# Patient Record
Sex: Female | Born: 1965 | Race: Black or African American | Hispanic: No | Smoking: Former smoker
Health system: Southern US, Community
[De-identification: ages and names within clinical notes are randomized; demographics above are authoritative.]

## PROBLEM LIST (undated history)

## (undated) DIAGNOSIS — K635 Polyp of colon: Secondary | ICD-10-CM

## (undated) DIAGNOSIS — K219 Gastro-esophageal reflux disease without esophagitis: Secondary | ICD-10-CM

## (undated) DIAGNOSIS — J45909 Unspecified asthma, uncomplicated: Secondary | ICD-10-CM

## (undated) DIAGNOSIS — F419 Anxiety disorder, unspecified: Secondary | ICD-10-CM

## (undated) DIAGNOSIS — E785 Hyperlipidemia, unspecified: Secondary | ICD-10-CM

## (undated) DIAGNOSIS — D649 Anemia, unspecified: Secondary | ICD-10-CM

## (undated) DIAGNOSIS — N189 Chronic kidney disease, unspecified: Secondary | ICD-10-CM

## (undated) DIAGNOSIS — T7840XA Allergy, unspecified, initial encounter: Secondary | ICD-10-CM

## (undated) DIAGNOSIS — Z923 Personal history of irradiation: Secondary | ICD-10-CM

## (undated) DIAGNOSIS — I10 Essential (primary) hypertension: Secondary | ICD-10-CM

## (undated) DIAGNOSIS — Z9289 Personal history of other medical treatment: Secondary | ICD-10-CM

## (undated) DIAGNOSIS — I739 Peripheral vascular disease, unspecified: Secondary | ICD-10-CM

## (undated) DIAGNOSIS — C50919 Malignant neoplasm of unspecified site of unspecified female breast: Secondary | ICD-10-CM

## (undated) DIAGNOSIS — Z79810 Long term (current) use of selective estrogen receptor modulators (SERMs): Secondary | ICD-10-CM

## (undated) DIAGNOSIS — F32A Depression, unspecified: Secondary | ICD-10-CM

## (undated) DIAGNOSIS — D219 Benign neoplasm of connective and other soft tissue, unspecified: Secondary | ICD-10-CM

## (undated) HISTORY — PX: BREAST SURGERY: SHX581

## (undated) HISTORY — PX: UPPER GASTROINTESTINAL ENDOSCOPY: SHX188

## (undated) HISTORY — DX: Anemia, unspecified: D64.9

## (undated) HISTORY — DX: Polyp of colon: K63.5

## (undated) HISTORY — DX: Allergy, unspecified, initial encounter: T78.40XA

## (undated) HISTORY — PX: BREAST LUMPECTOMY: SHX2

## (undated) HISTORY — PX: COLONOSCOPY: SHX174

## (undated) HISTORY — PX: APPENDECTOMY: SHX54

## (undated) HISTORY — DX: Hyperlipidemia, unspecified: E78.5

## (undated) HISTORY — DX: Depression, unspecified: F32.A

---

## 1985-11-23 HISTORY — PX: CERVICAL CONE BIOPSY: SUR198

## 1992-11-23 HISTORY — PX: LEG SURGERY: SHX1003

## 2001-08-04 ENCOUNTER — Other Ambulatory Visit: Admission: RE | Admit: 2001-08-04 | Discharge: 2001-08-04 | Payer: Self-pay | Admitting: Obstetrics and Gynecology

## 2002-08-10 ENCOUNTER — Other Ambulatory Visit: Admission: RE | Admit: 2002-08-10 | Discharge: 2002-08-10 | Payer: Self-pay | Admitting: Obstetrics and Gynecology

## 2002-10-01 ENCOUNTER — Emergency Department (HOSPITAL_COMMUNITY): Admission: EM | Admit: 2002-10-01 | Discharge: 2002-10-01 | Payer: Self-pay | Admitting: Emergency Medicine

## 2003-10-24 ENCOUNTER — Other Ambulatory Visit: Admission: RE | Admit: 2003-10-24 | Discharge: 2003-10-24 | Payer: Self-pay | Admitting: Obstetrics and Gynecology

## 2004-03-11 ENCOUNTER — Emergency Department (HOSPITAL_COMMUNITY): Admission: EM | Admit: 2004-03-11 | Discharge: 2004-03-12 | Payer: Self-pay | Admitting: *Deleted

## 2004-10-27 ENCOUNTER — Other Ambulatory Visit: Admission: RE | Admit: 2004-10-27 | Discharge: 2004-10-27 | Payer: Self-pay | Admitting: Obstetrics and Gynecology

## 2004-10-30 ENCOUNTER — Encounter: Admission: RE | Admit: 2004-10-30 | Discharge: 2004-10-30 | Payer: Self-pay | Admitting: Obstetrics and Gynecology

## 2007-11-21 ENCOUNTER — Other Ambulatory Visit: Admission: RE | Admit: 2007-11-21 | Discharge: 2007-11-21 | Payer: Self-pay | Admitting: *Deleted

## 2008-08-29 ENCOUNTER — Emergency Department (HOSPITAL_COMMUNITY): Admission: EM | Admit: 2008-08-29 | Discharge: 2008-08-29 | Payer: Self-pay | Admitting: Emergency Medicine

## 2009-01-09 ENCOUNTER — Encounter: Admission: RE | Admit: 2009-01-09 | Discharge: 2009-01-09 | Payer: Self-pay | Admitting: Family Medicine

## 2010-11-07 ENCOUNTER — Encounter
Admission: RE | Admit: 2010-11-07 | Discharge: 2010-11-07 | Payer: Self-pay | Source: Home / Self Care | Attending: Internal Medicine | Admitting: Internal Medicine

## 2010-11-18 ENCOUNTER — Encounter
Admission: RE | Admit: 2010-11-18 | Discharge: 2010-11-18 | Payer: Self-pay | Source: Home / Self Care | Attending: Internal Medicine | Admitting: Internal Medicine

## 2010-12-13 ENCOUNTER — Encounter: Payer: Self-pay | Admitting: Family Medicine

## 2010-12-17 ENCOUNTER — Ambulatory Visit (HOSPITAL_COMMUNITY): Admission: RE | Admit: 2010-12-17 | Payer: Self-pay | Source: Home / Self Care | Admitting: General Surgery

## 2011-06-09 ENCOUNTER — Other Ambulatory Visit: Payer: Self-pay | Admitting: Family Medicine

## 2011-06-09 DIAGNOSIS — R921 Mammographic calcification found on diagnostic imaging of breast: Secondary | ICD-10-CM

## 2011-06-15 ENCOUNTER — Other Ambulatory Visit: Payer: Self-pay | Admitting: Family Medicine

## 2011-06-15 ENCOUNTER — Ambulatory Visit
Admission: RE | Admit: 2011-06-15 | Discharge: 2011-06-15 | Disposition: A | Discharge status: Home / Self Care | Payer: BC Managed Care – PPO | Source: Ambulatory Visit | Attending: Family Medicine | Admitting: Family Medicine

## 2011-06-15 DIAGNOSIS — R921 Mammographic calcification found on diagnostic imaging of breast: Secondary | ICD-10-CM

## 2011-09-03 DIAGNOSIS — D649 Anemia, unspecified: Secondary | ICD-10-CM | POA: Insufficient documentation

## 2011-12-04 ENCOUNTER — Other Ambulatory Visit: Payer: Self-pay | Admitting: Family Medicine

## 2011-12-04 DIAGNOSIS — N63 Unspecified lump in unspecified breast: Secondary | ICD-10-CM

## 2011-12-04 DIAGNOSIS — R921 Mammographic calcification found on diagnostic imaging of breast: Secondary | ICD-10-CM

## 2011-12-31 ENCOUNTER — Ambulatory Visit
Admission: RE | Admit: 2011-12-31 | Discharge: 2011-12-31 | Disposition: A | Discharge status: Home / Self Care | Payer: BC Managed Care – PPO | Source: Ambulatory Visit | Attending: Family Medicine | Admitting: Family Medicine

## 2011-12-31 DIAGNOSIS — R921 Mammographic calcification found on diagnostic imaging of breast: Secondary | ICD-10-CM

## 2011-12-31 DIAGNOSIS — N63 Unspecified lump in unspecified breast: Secondary | ICD-10-CM

## 2012-01-07 DIAGNOSIS — N852 Hypertrophy of uterus: Secondary | ICD-10-CM | POA: Insufficient documentation

## 2012-03-29 ENCOUNTER — Emergency Department (INDEPENDENT_AMBULATORY_CARE_PROVIDER_SITE_OTHER): Payer: BC Managed Care – PPO

## 2012-03-29 ENCOUNTER — Emergency Department (HOSPITAL_BASED_OUTPATIENT_CLINIC_OR_DEPARTMENT_OTHER)
Admission: EM | Admit: 2012-03-29 | Discharge: 2012-03-30 | Disposition: A | Discharge status: Home / Self Care | Payer: BC Managed Care – PPO | Attending: Emergency Medicine | Admitting: Emergency Medicine

## 2012-03-29 ENCOUNTER — Encounter (HOSPITAL_BASED_OUTPATIENT_CLINIC_OR_DEPARTMENT_OTHER): Payer: Self-pay | Admitting: *Deleted

## 2012-03-29 DIAGNOSIS — Z87891 Personal history of nicotine dependence: Secondary | ICD-10-CM | POA: Insufficient documentation

## 2012-03-29 DIAGNOSIS — R51 Headache: Secondary | ICD-10-CM | POA: Insufficient documentation

## 2012-03-29 DIAGNOSIS — I10 Essential (primary) hypertension: Secondary | ICD-10-CM | POA: Insufficient documentation

## 2012-03-29 DIAGNOSIS — F411 Generalized anxiety disorder: Secondary | ICD-10-CM | POA: Insufficient documentation

## 2012-03-29 DIAGNOSIS — G43909 Migraine, unspecified, not intractable, without status migrainosus: Secondary | ICD-10-CM

## 2012-03-29 HISTORY — DX: Essential (primary) hypertension: I10

## 2012-03-29 HISTORY — DX: Anxiety disorder, unspecified: F41.9

## 2012-03-29 MED ORDER — HYDROCODONE-ACETAMINOPHEN 5-325 MG PO TABS
2.0000 | ORAL_TABLET | Freq: Once | ORAL | Status: AC
  Administered 2012-03-29: 2 via ORAL
  Filled 2012-03-29: qty 2

## 2012-03-29 NOTE — ED Notes (Signed)
Pt reports a lot of "tightness" in her head. Hx of migraine. Was recently started on prednisone and a ABX for cough.

## 2012-03-29 NOTE — ED Provider Notes (Signed)
History     CSN: [id number]  Arrival date & time 03/29/12  2202   First MD Initiated Contact with Patient 03/29/12 2254      Chief Complaint  Patient presents with  . Migraine    (Consider location/radiation/quality/duration/timing/severity/associated sxs/prior treatment) HPI Comments: Being treated for uri, today developed pressure in the front of her face and forehead.    Patient is a 46 y.o. female presenting with migraine. The history is provided by the patient.  Migraine This is a new problem. Episode onset: 6 hours ago. The problem occurs constantly. The problem has been gradually worsening. Associated symptoms include headaches.    Past Medical History  Diagnosis Date  . Anxiety   . Hypertension     Past Surgical History  Procedure Date  . Cesarean section   . Appendectomy     No family history on file.  History  Substance Use Topics  . Smoking status: Former Games developer  . Smokeless tobacco: Not on file  . Alcohol Use: Yes     rarely    OB History    Grav Para Term Preterm Abortions TAB SAB Ect Mult Living                  Review of Systems  Neurological: Positive for headaches.  All other systems reviewed and are negative.    Allergies  Review of patient's allergies indicates no known allergies.  Home Medications   Current Outpatient Rx  Name Route Sig Dispense Refill  . ALPRAZOLAM 0.5 MG PO TABS Oral Take 0.5 mg by mouth as needed.    . AMOXICILLIN 500 MG PO TABS Oral Take 500 mg by mouth 2 (two) times daily.    . CHLORTHALIDONE 25 MG PO TABS Oral Take 25 mg by mouth daily.    Marland Kitchen PREDNISONE 5 MG PO TABS Oral Take 5 mg by mouth as directed.      BP 164/96  Pulse 120  Temp(Src) 98.6 F (37 C) (Oral)  Resp 20  Ht 5\' 7"  (1.702 m)  Wt 195 lb (88.451 kg)  BMI 30.54 kg/m2  SpO2 100%  LMP 03/15/2012  Physical Exam  Nursing note and vitals reviewed. Constitutional: She is oriented to person, place, and time. She appears well-developed and  well-nourished. No distress.  HENT:  Head: Normocephalic and atraumatic.  Eyes: EOM are normal. Pupils are equal, round, and reactive to light.       No papilledema  Neck: Normal range of motion. Neck supple.  Cardiovascular: Normal rate and regular rhythm.  Exam reveals no gallop and no friction rub.   No murmur heard. Pulmonary/Chest: Effort normal and breath sounds normal. No respiratory distress. She has no wheezes.  Abdominal: Soft. Bowel sounds are normal. She exhibits no distension. There is no tenderness.  Musculoskeletal: Normal range of motion.  Neurological: She is alert and oriented to person, place, and time. No cranial nerve deficit. She exhibits normal muscle tone. Coordination normal.  Skin: Skin is warm and dry. She is not diaphoretic.    ED Course  Procedures (including critical care time)  Labs Reviewed - No data to display No results found.   No diagnosis found.    MDM  The ct looks okay.  I am unsure of the cause of her headache/pressure, but it does not appear emergent.  Will treat with pain meds, to return prn if worsens.        Geoffery Lyons, MD 03/30/12 [phone]

## 2012-03-30 MED ORDER — HYDROCODONE-ACETAMINOPHEN 5-500 MG PO TABS: 1.0000 | ORAL_TABLET | Freq: Four times a day (QID) | ORAL | Status: AC | PRN

## 2012-03-30 NOTE — Discharge Instructions (Signed)

## 2012-12-08 ENCOUNTER — Other Ambulatory Visit: Payer: Self-pay | Admitting: Family Medicine

## 2012-12-08 DIAGNOSIS — R921 Mammographic calcification found on diagnostic imaging of breast: Secondary | ICD-10-CM

## 2013-01-02 ENCOUNTER — Ambulatory Visit
Admission: RE | Admit: 2013-01-02 | Discharge: 2013-01-02 | Disposition: A | Discharge status: Home / Self Care | Payer: BC Managed Care – PPO | Source: Ambulatory Visit | Attending: Family Medicine | Admitting: Family Medicine

## 2013-01-02 DIAGNOSIS — R921 Mammographic calcification found on diagnostic imaging of breast: Secondary | ICD-10-CM

## 2013-01-09 ENCOUNTER — Ambulatory Visit (INDEPENDENT_AMBULATORY_CARE_PROVIDER_SITE_OTHER): Payer: BC Managed Care – PPO | Admitting: General Surgery

## 2013-01-09 ENCOUNTER — Encounter (INDEPENDENT_AMBULATORY_CARE_PROVIDER_SITE_OTHER): Payer: Self-pay | Admitting: General Surgery

## 2013-01-09 VITALS — BP 132/88 | HR 88 | Temp 98.6°F | Resp 16 | Ht 67.5 in | Wt 189.4 lb

## 2013-01-09 DIAGNOSIS — R920 Mammographic microcalcification found on diagnostic imaging of breast: Secondary | ICD-10-CM

## 2013-01-09 NOTE — Patient Instructions (Signed)
You have been found to have persistent, suspicious microcalcifications behind your left nipple. I think they have increased in number. We do not see a mass.  You will be scheduled for a left breast excisional biopsy with wire localization in the near future.    Breast Biopsy A breast biopsy is a procedure where a sample of breast tissue is removed from your breast. The tissue is examined under a microscope to see if cancerous cells are present. A breast biopsy is done when there is:  Any undiagnosed breast mass (tumor).  Nipple abnormalities, dimpling, crusting, or ulcerations.  Abnormal discharge from the nipple, especially blood.  Redness, swelling, and pain of the breast.  Calcium deposits (calcifications) or abnormalities seen on a mammogram, ultrasound result, or results of magnetic resonance imaging (MRI).  Suspicious changes in the breast seen on your mammogram. If the tumor is found to be cancerous (malignant), a breast biopsy can help to determine what the best treatment is for you. There are many different types of breast biopsies. Talk to your caregiver about your options and which type is best for you. LET YOUR CAREGIVER KNOW ABOUT:  Allergies to food or medicine.  Medicines taken, including vitamins, herbs, eyedrops, over-the-counter medicines, and creams.  Use of steroids (by mouth or creams).  Previous problems with anesthetics or numbing medicines.  History of bleeding problems or blood clots.  Previous surgery.  Other health problems, including diabetes and kidney problems.  Any recent colds or infections.  Possibility of pregnancy, if this applies. RISKS AND COMPLICATIONS   Bleeding.  Infection.  Allergy to medicines.  Bruising and swelling of the breast.  Alteration in the shape of the breast.  Not finding the lump or abnormality.  Needing more surgery. BEFORE THE PROCEDURE  Arrange for someone to drive you home after the procedure.  Do  not smoke for 2 weeks before the procedure. Stop smoking, if you smoke.  Do not drink alcohol for 24 hours before procedure.  Wear a good support bra to the procedure. PROCEDURE  You may be given a medicine to numb the breast area (local anesthesia) or a medicine to make you sleep (general anesthesia) during the procedure. The following are the different types of biopsies that can be performed.   Fine-needle aspiration A thin needle is attached to a syringe and inserted into the breast lump. Fluid and cells are removed and then looked at under a microscope. If the breast lump cannot be felt, an ultrasound may be used to help locate the lump and place the needle in the correct area.   Core needle biopsy A wide, hollow needle (core needle) is inserted into the breast lump 3 6 times to get tissue samples or cores. The samples are removed. The needle is usually placed in the correct area by using an ultrasound or X-ray.   Stereotactic biopsy X-ray equipment and a computer are used to analyze X-ray pictures of the breast lump. The computer then finds exactly where the core needle needs to be inserted. Tissue samples are removed.   Vacuum-assisted biopsy A small incision (less than  inch) is made in your breast. A biopsy device that includes a hollow needle and vacuum is passed through the incision and into the breast tissue. The vacuum gently draws abnormal breast tissue into the needle to remove it. This type of biopsy removes a larger tissue sample than a regular core needle biopsy. No stitches are needed, and there is usually little scarring.  Ultrasound-guided  core needle biopsy A high frequency ultrasound helps guide the core needle to the area of the mass or abnormality. An incision is made to insert the needle. Tissue samples are removed.  Open biopsy A larger incision is made in the breast. Your caregiver will attempt to remove the whole breast lump or as much as possible. AFTER THE  PROCEDURE  You will be taken to the recovery area. If you are doing well and have no problems, you will be allowed to go home.  You may notice bruising on your breast. This is normal.  Your caregiver may apply a pressure dressing on your breast for 24 48 hours. A pressure dressing is a bandage that is wrapped tightly around the chest to stop fluid from collecting underneath tissues. Document Released: 11/09/2005 Document Revised: 05/10/2012 Document Reviewed: 12/10/2011 [facility] Patient Information 2013 [place], [place].

## 2013-01-09 NOTE — Progress Notes (Signed)
Patient ID: Tracy Perez, female   DOB: 07/09/1966, 47 y.o.   MRN: [id number]  Chief Complaint  Patient presents with  . Breast Problem    new pt- eval lt breast calcification    HPI Tracy Perez is a 47 y.o. female.  She is referred by Dr. Christiana Pellant [facility] for evaluation of microcalcifications, left breast, retroareolar area.  This patient does not perceive a breast mass. She does not have a nipple discharge. She has never had breast surgery. She sometimes gets intermittent tingling in the left breast. Recent mammogram surface showed a suspicious retroareolar calcifications, too superficial to biopsy, BI-RADS category 4. She has had these problems in the past and I have looked at her prior films. This is the same area, but it looks like a few more calcifications than she had before, but no mass effect.  Family history reveals an aunt had a lumpectomy but she's not certain of the diagnosis. No other breast or ovarian malignancies noted.  Comorbidities include hypertension, anxiety, BMI 29. She's had 2 C-sections and an appendectomy. She is here with her husband today. She works as a Location manager HPI  Past Medical History  Diagnosis Date  . Anxiety   . Hypertension     Past Surgical History  Procedure Laterality Date  . Cesarean section    . Appendectomy      Family History  Problem Relation Age of Onset  . Cancer Maternal Aunt     breast    Social History History  Substance Use Topics  . Smoking status: Former Smoker    Quit date: 11/23/1989  . Smokeless tobacco: Not on file  . Alcohol Use: Yes     Comment: rarely    No Known Allergies  Current Outpatient Prescriptions  Medication Sig Dispense Refill  . ALPRAZolam (XANAX) 0.5 MG tablet Take 0.5 mg by mouth as needed.      . chlorthalidone (HYGROTON) 25 MG tablet Take 25 mg by mouth daily.       No current facility-administered medications for this visit.    Review of  Systems Review of Systems  Constitutional: Negative.   HENT: Negative.   Eyes: Negative.   Respiratory: Negative.   Cardiovascular: Negative.   Gastrointestinal: Negative.   Genitourinary: Negative.   Skin: Negative for rash and wound.  Neurological: Negative.   Hematological: Negative for adenopathy. Does not bruise/bleed easily.  Psychiatric/Behavioral: Negative for confusion. The patient is nervous/anxious.     Blood pressure 132/88, pulse 88, temperature 98.6 F (37 C), temperature source Temporal, resp. rate 16, height 5' 7.5" (1.715 m), weight 189 lb 6.4 oz (85.911 kg), last menstrual period 11/03/2012.  Physical Exam Physical Exam  Constitutional: She is oriented to person, place, and time. She appears well-developed and well-nourished. No distress.  Her husband is with her in the room throughout the encounter.  HENT:  Head: Normocephalic and atraumatic.  Nose: Nose normal.  Mouth/Throat: No oropharyngeal exudate.  Eyes: Conjunctivae and EOM are normal. Pupils are equal, round, and reactive to light. Left eye exhibits no discharge. No scleral icterus.  Neck: Neck supple. No JVD present. No tracheal deviation present. No thyromegaly present.  Cardiovascular: Normal rate, regular rhythm, normal heart sounds and intact distal pulses.   No murmur heard. Pulmonary/Chest: Effort normal and breath sounds normal. No respiratory distress. She has no wheezes. She has no rales. She exhibits no tenderness.  Both breasts are moderately large, soft. No skin lesions. No  nipple discharge. No palpable mass. No axillary adenopathy.  Abdominal: Soft. Bowel sounds are normal. She exhibits no distension and no mass. There is no tenderness. There is no rebound and no guarding.  Musculoskeletal: She exhibits no edema and no tenderness.  Lymphadenopathy:    She has no cervical adenopathy.  Neurological: She is alert and oriented to person, place, and time. She exhibits normal muscle tone.  Coordination normal.  Skin: Skin is warm. No rash noted. She is not diaphoretic. No erythema. No pallor.  Psychiatric: She has a normal mood and affect. Her behavior is normal. Judgment and thought content normal.    Data Reviewed Recent mammograms. Prior mammograms. Our old office notes.  Assessment    Suspicious microcalcifications, left breast, superficial retroareolar area. Risk of occult noninvasive cancer may be as high as 10% here.  Chronic anxiety  Hypertension  History C-section appendectomy     Plan    I had a long discussion with the patient and her husband about the imaging findings in change over time. Had discussion with him about differential diagnosis. Positive about the indications for surgical biopsy. Dr. Nona Dell techniques and Kling circumareolar incision. I told her that there was at least a 30% or greater chance that she would have abnormal sensation of the nipple.. Probably less than 5% chance of nipple necrosis. She was to go ahead and have the surgery done.  We'll schedule for left excisional biopsy, With needle localization.  I have discussed the indications, details, techniques, and numerous risks with the patient and her husband. They understand all these issues. All of  their questions were answered. They agree with this plan.       Angelia Mould. Derrell Lolling, M.D., [facility] General and Minimally invasive Surgery Breast and Colorectal Surgery Office:   [phone] Pager:   [phone]  01/09/2013, 10:17 AM

## 2013-01-10 ENCOUNTER — Encounter (HOSPITAL_BASED_OUTPATIENT_CLINIC_OR_DEPARTMENT_OTHER): Payer: Self-pay | Admitting: *Deleted

## 2013-01-10 NOTE — Progress Notes (Signed)
To come in for [id number]

## 2013-01-12 ENCOUNTER — Encounter (HOSPITAL_BASED_OUTPATIENT_CLINIC_OR_DEPARTMENT_OTHER)
Admission: RE | Admit: 2013-01-12 | Discharge: 2013-01-12 | Disposition: A | Discharge status: Home / Self Care | Payer: BC Managed Care – PPO | Source: Ambulatory Visit | Attending: General Surgery | Admitting: General Surgery

## 2013-01-12 LAB — BASIC METABOLIC PANEL
Calcium: 10 mg/dL (ref 8.4–10.5)
GFR calc Af Amer: >90 mL/min (ref >90)
GFR calc non Af Amer: >90 mL/min (ref >90)
Glucose, Bld: 77 mg/dL (ref 70–99)
Potassium: 4.1 mEq/L (ref 3.5–5.1)
Sodium: 140 mEq/L (ref 135–145)

## 2013-01-15 NOTE — H&P (Signed)
Tracy Perez   MRN:  [id number]   Description: 47 year old female  Provider: [name], MD  Department: [facility]       Diagnoses    Abnormal mammogram with microcalcification    -  Primary    793.81      Reason for Visit    Breast Problem    new pt- eval lt breast calcification        Current Vitals - Last Recorded    BP Pulse Temp(Src) Resp Ht Wt    132/88 88 98.6 F (37 C) (Temporal) 16 5' 7.5" (1.715 m) 189 lb 6.4 oz (85.911 kg)    BMI - 29.21 kg/m2 11/03/2012              History and Physical    [name], MD    Status: Signed                         HPI Tracy Perez is a 47 y.o. female.  She is referred by Dr. Christiana Pellant at the [facility] for evaluation of microcalcifications, left breast, retroareolar area.   This patient does not perceive a breast mass. She does not have a nipple discharge. She has never had breast surgery. She sometimes gets intermittent tingling in the left breast. Recent mammogram  showed a suspicious retroareolar calcifications, too superficial to biopsy, BI-RADS category 4. She has had these problems in the past and I have looked at her prior films. This is the same area, but it looks like a few more calcifications than she had before, but no mass effect.   Family history reveals an aunt had a lumpectomy but she's not certain of the diagnosis. No other breast or ovarian malignancies noted.   Comorbidities include hypertension, anxiety, BMI 29. She's had 2 C-sections and an appendectomy. She is here with her husband today. She works as a Location manager       Past Medical History   Diagnosis  Date   .  Anxiety     .  Hypertension           Past Surgical History   Procedure  Laterality  Date   .  Cesarean section       .  Appendectomy             Family History   Problem  Relation  Age of Onset   .  Cancer  Maternal Aunt         breast        Social History History    Substance Use Topics   .  Smoking status:  Former Smoker       Quit date:  11/23/1989   .  Smokeless tobacco:  Not on file   .  Alcohol Use:  Yes         Comment: rarely        No Known Allergies    Current Outpatient Prescriptions   Medication  Sig  Dispense  Refill   .  ALPRAZolam (XANAX) 0.5 MG tablet  Take 0.5 mg by mouth as needed.         .  chlorthalidone (HYGROTON) 25 MG tablet  Take 25 mg by mouth daily.             No current facility-administered medications for this visit.        Review of Systems  Constitutional: Negative.  HENT: Negative.   Eyes: Negative.   Respiratory: Negative.   Cardiovascular: Negative.   Gastrointestinal: Negative.   Genitourinary: Negative.   Skin: Negative for rash and wound.  Neurological: Negative.   Hematological: Negative for adenopathy. Does not bruise/bleed easily.  Psychiatric/Behavioral: Negative for confusion. The patient is nervous/anxious.       Blood pressure 132/88, pulse 88, temperature 98.6 F (37 C), temperature source Temporal, resp. rate 16, height 5' 7.5" (1.715 m), weight 189 lb 6.4 oz (85.911 kg), last menstrual period 11/03/2012.   Physical Exam   Constitutional: She is oriented to person, place, and time. She appears well-developed and well-nourished. No distress.  Her husband is with her in the room throughout the encounter.  HENT:   Head: Normocephalic and atraumatic.   Nose: Nose normal.   Mouth/Throat: No oropharyngeal exudate.  Eyes: Conjunctivae and EOM are normal. Pupils are equal, round, and reactive to light. Left eye exhibits no discharge. No scleral icterus.  Neck: Neck supple. No JVD present. No tracheal deviation present. No thyromegaly present.  Cardiovascular: Normal rate, regular rhythm, normal heart sounds and intact distal pulses.    No murmur heard. Pulmonary/Chest: Effort normal and breath sounds normal. No respiratory distress. She has no wheezes. She has no rales. She  exhibits no tenderness.  Both breasts are moderately large, soft. No skin lesions. No nipple discharge. No palpable mass. No axillary adenopathy.  Abdominal: Soft. Bowel sounds are normal. She exhibits no distension and no mass. There is no tenderness. There is no rebound and no guarding.  Musculoskeletal: She exhibits no edema and no tenderness.  Lymphadenopathy:    She has no cervical adenopathy.  Neurological: She is alert and oriented to person, place, and time. She exhibits normal muscle tone. Coordination normal.  Skin: Skin is warm. No rash noted. She is not diaphoretic. No erythema. No pallor.  Psychiatric: She has a normal mood and affect. Her behavior is normal. Judgment and thought content normal.      Data Reviewed Recent mammograms. Prior mammograms. Our old office notes.   Assessment    Suspicious microcalcifications, left breast, superficial retroareolar area. Risk of occult noninvasive cancer may be as high as 10% here.   Chronic anxiety   Hypertension   History C-section appendectomy      Plan    I had a long discussion with the patient and her husband about the imaging findings in change over time. Had discussion with them about differential diagnosis and  about the indications for surgical biopsy. I discussed the  techniques and circumareolar incision. I told her that there was at least a 30% or greater chance that she would have abnormal sensation of the nipple.. Probably less than 5% chance of nipple necrosis. She wants to go ahead and have the surgery done.   We'll schedule for left excisional biopsy, with needle localization.   I have discussed the indications, details, techniques, and numerous risks with the patient and her husband. They understand all these issues. All of  their questions were answered. They agree with this plan.          Angelia Mould. Derrell Lolling, M.D., [facility] General and Minimally invasive  Surgery Breast and Colorectal Surgery Office:   [phone] Pager:   [phone]

## 2013-01-16 ENCOUNTER — Telehealth (INDEPENDENT_AMBULATORY_CARE_PROVIDER_SITE_OTHER): Payer: Self-pay

## 2013-01-16 ENCOUNTER — Ambulatory Visit (HOSPITAL_BASED_OUTPATIENT_CLINIC_OR_DEPARTMENT_OTHER)
Admission: RE | Admit: 2013-01-16 | Discharge: 2013-01-16 | Disposition: A | Discharge status: Home / Self Care | Payer: BC Managed Care – PPO | Source: Ambulatory Visit | Attending: General Surgery | Admitting: General Surgery

## 2013-01-16 ENCOUNTER — Encounter (HOSPITAL_BASED_OUTPATIENT_CLINIC_OR_DEPARTMENT_OTHER)
Admission: RE | Disposition: A | Discharge status: Home / Self Care | Payer: Self-pay | Source: Ambulatory Visit | Attending: General Surgery

## 2013-01-16 ENCOUNTER — Encounter (HOSPITAL_BASED_OUTPATIENT_CLINIC_OR_DEPARTMENT_OTHER): Payer: Self-pay | Admitting: Certified Registered Nurse Anesthetist

## 2013-01-16 ENCOUNTER — Ambulatory Visit (HOSPITAL_BASED_OUTPATIENT_CLINIC_OR_DEPARTMENT_OTHER): Payer: BC Managed Care – PPO | Admitting: Certified Registered Nurse Anesthetist

## 2013-01-16 ENCOUNTER — Ambulatory Visit
Admission: RE | Admit: 2013-01-16 | Discharge: 2013-01-16 | Disposition: A | Discharge status: Home / Self Care | Payer: BC Managed Care – PPO | Source: Ambulatory Visit | Attending: General Surgery | Admitting: General Surgery

## 2013-01-16 ENCOUNTER — Encounter (INDEPENDENT_AMBULATORY_CARE_PROVIDER_SITE_OTHER): Payer: Self-pay

## 2013-01-16 DIAGNOSIS — D0590 Unspecified type of carcinoma in situ of unspecified breast: Secondary | ICD-10-CM | POA: Insufficient documentation

## 2013-01-16 DIAGNOSIS — R920 Mammographic microcalcification found on diagnostic imaging of breast: Secondary | ICD-10-CM

## 2013-01-16 DIAGNOSIS — F411 Generalized anxiety disorder: Secondary | ICD-10-CM | POA: Insufficient documentation

## 2013-01-16 DIAGNOSIS — C50919 Malignant neoplasm of unspecified site of unspecified female breast: Secondary | ICD-10-CM

## 2013-01-16 DIAGNOSIS — I10 Essential (primary) hypertension: Secondary | ICD-10-CM | POA: Insufficient documentation

## 2013-01-16 HISTORY — DX: Malignant neoplasm of unspecified site of unspecified female breast: C50.919

## 2013-01-16 HISTORY — PX: PARTIAL MASTECTOMY WITH NEEDLE LOCALIZATION: SHX6008

## 2013-01-16 SURGERY — PARTIAL MASTECTOMY WITH NEEDLE LOCALIZATION
Anesthesia: General | Site: Breast | Laterality: Left | Wound class: Clean

## 2013-01-16 MED ORDER — BUPIVACAINE-EPINEPHRINE 0.5% -1:200000 IJ SOLN
INTRAMUSCULAR | Status: DC | PRN
  Administered 2013-01-16: 10 mL

## 2013-01-16 MED ORDER — SODIUM CHLORIDE 0.9 % IV SOLN: 250.0000 mL | INTRAVENOUS | Status: DC | PRN

## 2013-01-16 MED ORDER — MIDAZOLAM HCL 2 MG/2ML IJ SOLN: 1.0000 mg | INTRAMUSCULAR | Status: DC | PRN

## 2013-01-16 MED ORDER — FENTANYL CITRATE 0.05 MG/ML IJ SOLN: 50.0000 ug | INTRAMUSCULAR | Status: DC | PRN

## 2013-01-16 MED ORDER — SODIUM CHLORIDE 0.9 % IV SOLN: INTRAVENOUS | Status: DC

## 2013-01-16 MED ORDER — 0.9 % SODIUM CHLORIDE (POUR BTL) OPTIME
TOPICAL | Status: DC | PRN
  Administered 2013-01-16: 200 mL

## 2013-01-16 MED ORDER — SODIUM CHLORIDE 0.9 % IJ SOLN: 3.0000 mL | Freq: Two times a day (BID) | INTRAMUSCULAR | Status: DC

## 2013-01-16 MED ORDER — PROPOFOL 10 MG/ML IV BOLUS
INTRAVENOUS | Status: DC | PRN
  Administered 2013-01-16: 200 mg via INTRAVENOUS

## 2013-01-16 MED ORDER — MORPHINE SULFATE 2 MG/ML IJ SOLN: 2.0000 mg | INTRAMUSCULAR | Status: DC | PRN

## 2013-01-16 MED ORDER — ACETAMINOPHEN 10 MG/ML IV SOLN
1000.0000 mg | Freq: Once | INTRAVENOUS | Status: AC
  Administered 2013-01-16: 1000 mg via INTRAVENOUS

## 2013-01-16 MED ORDER — HYDROMORPHONE HCL PF 1 MG/ML IJ SOLN
0.2500 mg | INTRAMUSCULAR | Status: DC | PRN
  Administered 2013-01-16: 0.5 mg via INTRAVENOUS

## 2013-01-16 MED ORDER — CEFAZOLIN SODIUM-DEXTROSE 2-3 GM-% IV SOLR
2.0000 g | INTRAVENOUS | Status: AC
  Administered 2013-01-16: 2 g via INTRAVENOUS

## 2013-01-16 MED ORDER — METOCLOPRAMIDE HCL 5 MG/ML IJ SOLN: 10.0000 mg | Freq: Once | INTRAMUSCULAR | Status: DC | PRN

## 2013-01-16 MED ORDER — ACETAMINOPHEN 325 MG PO TABS: 650.0000 mg | ORAL_TABLET | ORAL | Status: DC | PRN

## 2013-01-16 MED ORDER — LIDOCAINE HCL (CARDIAC) 20 MG/ML IV SOLN
INTRAVENOUS | Status: DC | PRN
  Administered 2013-01-16: 60 mg via INTRAVENOUS

## 2013-01-16 MED ORDER — KETOROLAC TROMETHAMINE 30 MG/ML IJ SOLN
INTRAMUSCULAR | Status: DC | PRN
  Administered 2013-01-16: 30 mg via INTRAVENOUS

## 2013-01-16 MED ORDER — CHLORHEXIDINE GLUCONATE 4 % EX LIQD: 1.0000 "application " | Freq: Once | CUTANEOUS | Status: DC

## 2013-01-16 MED ORDER — LACTATED RINGERS IV SOLN
INTRAVENOUS | Status: DC
  Administered 2013-01-16 (×2): via INTRAVENOUS

## 2013-01-16 MED ORDER — ONDANSETRON HCL 4 MG/2ML IJ SOLN: 4.0000 mg | Freq: Four times a day (QID) | INTRAMUSCULAR | Status: DC | PRN

## 2013-01-16 MED ORDER — ACETAMINOPHEN 650 MG RE SUPP: 650.0000 mg | RECTAL | Status: DC | PRN

## 2013-01-16 MED ORDER — DEXAMETHASONE SODIUM PHOSPHATE 4 MG/ML IJ SOLN
INTRAMUSCULAR | Status: DC | PRN
  Administered 2013-01-16: 10 mg via INTRAVENOUS

## 2013-01-16 MED ORDER — FENTANYL CITRATE 0.05 MG/ML IJ SOLN
INTRAMUSCULAR | Status: DC | PRN
  Administered 2013-01-16: 50 ug via INTRAVENOUS

## 2013-01-16 MED ORDER — HYDROCODONE-ACETAMINOPHEN 5-325 MG PO TABS: 1.0000 | ORAL_TABLET | ORAL | Status: DC | PRN

## 2013-01-16 MED ORDER — OXYCODONE HCL 5 MG PO TABS: 5.0000 mg | ORAL_TABLET | Freq: Once | ORAL | Status: DC | PRN

## 2013-01-16 MED ORDER — OXYCODONE HCL 5 MG/5ML PO SOLN: 5.0000 mg | Freq: Once | ORAL | Status: DC | PRN

## 2013-01-16 MED ORDER — SODIUM CHLORIDE 0.9 % IJ SOLN: 3.0000 mL | INTRAMUSCULAR | Status: DC | PRN

## 2013-01-16 MED ORDER — ONDANSETRON HCL 4 MG/2ML IJ SOLN
INTRAMUSCULAR | Status: DC | PRN
  Administered 2013-01-16: 4 mg via INTRAVENOUS

## 2013-01-16 MED ORDER — OXYCODONE HCL 5 MG PO TABS: 5.0000 mg | ORAL_TABLET | ORAL | Status: DC | PRN

## 2013-01-16 MED ORDER — MIDAZOLAM HCL 5 MG/5ML IJ SOLN
INTRAMUSCULAR | Status: DC | PRN
  Administered 2013-01-16: 1 mg via INTRAVENOUS

## 2013-01-16 MED ORDER — SCOPOLAMINE 1 MG/3DAYS TD PT72
1.0000 | MEDICATED_PATCH | Freq: Once | TRANSDERMAL | Status: DC
  Administered 2013-01-16: 1.5 mg via TRANSDERMAL

## 2013-01-16 SURGICAL SUPPLY — 56 items
ADH SKN CLS APL DERMABOND .7 (GAUZE/BANDAGES/DRESSINGS) ×1
APL SKNCLS STERI-STRIP NONHPOA (GAUZE/BANDAGES/DRESSINGS)
APPLIER CLIP 9.375 MED OPEN (MISCELLANEOUS)
APR CLP MED 9.3 20 MLT OPN (MISCELLANEOUS)
BANDAGE ELASTIC 6 VELCRO ST LF (GAUZE/BANDAGES/DRESSINGS) IMPLANT
BENZOIN TINCTURE PRP APPL 2/3 (GAUZE/BANDAGES/DRESSINGS) IMPLANT
BLADE SURG 15 STRL LF DISP TIS (BLADE) ×2 IMPLANT
BLADE SURG 15 STRL SS (BLADE) ×2
CANISTER SUCTION 1200CC (MISCELLANEOUS) ×2 IMPLANT
CHLORAPREP W/TINT 26ML (MISCELLANEOUS) ×2 IMPLANT
CLIP APPLIE 9.375 MED OPEN (MISCELLANEOUS) IMPLANT
CLOTH BEACON ORANGE TIMEOUT ST (SAFETY) ×2 IMPLANT
COVER MAYO STAND STRL (DRAPES) ×2 IMPLANT
COVER TABLE BACK 60X90 (DRAPES) ×2 IMPLANT
DECANTER SPIKE VIAL GLASS SM (MISCELLANEOUS) IMPLANT
DERMABOND ADVANCED (GAUZE/BANDAGES/DRESSINGS) ×1
DERMABOND ADVANCED .7 DNX12 (GAUZE/BANDAGES/DRESSINGS) IMPLANT
DEVICE DUBIN W/COMP PLATE 8390 (MISCELLANEOUS) ×1 IMPLANT
DRAPE LAPAROSCOPIC ABDOMINAL (DRAPES) IMPLANT
DRAPE LAPAROTOMY TRNSV 102X78 (DRAPE) IMPLANT
DRAPE PED LAPAROTOMY (DRAPES) ×2 IMPLANT
DRAPE UTILITY XL STRL (DRAPES) ×2 IMPLANT
ELECT REM PT RETURN 9FT ADLT (ELECTROSURGICAL) ×2
ELECTRODE REM PT RTRN 9FT ADLT (ELECTROSURGICAL) ×1 IMPLANT
GAUZE SPONGE 4X4 12PLY STRL LF (GAUZE/BANDAGES/DRESSINGS) IMPLANT
GAUZE SPONGE 4X4 16PLY XRAY LF (GAUZE/BANDAGES/DRESSINGS) IMPLANT
GLOVE BIO SURGEON STRL SZ7 (GLOVE) ×1 IMPLANT
GLOVE EUDERMIC 7 POWDERFREE (GLOVE) ×2 IMPLANT
GLOVE INDICATOR 7.0 STRL GRN (GLOVE) ×1 IMPLANT
GOWN PREVENTION PLUS XLARGE (GOWN DISPOSABLE) ×2 IMPLANT
GOWN PREVENTION PLUS XXLARGE (GOWN DISPOSABLE) ×2 IMPLANT
KIT MARKER MARGIN INK (KITS) IMPLANT
NDL HYPO 25X1 1.5 SAFETY (NEEDLE) ×1 IMPLANT
NEEDLE HYPO 22GX1.5 SAFETY (NEEDLE) IMPLANT
NEEDLE HYPO 25X1 1.5 SAFETY (NEEDLE) ×2 IMPLANT
NS IRRIG 1000ML POUR BTL (IV SOLUTION) ×2 IMPLANT
PACK BASIN DAY SURGERY FS (CUSTOM PROCEDURE TRAY) ×2 IMPLANT
PENCIL BUTTON HOLSTER BLD 10FT (ELECTRODE) ×2 IMPLANT
SLEEVE SCD COMPRESS KNEE MED (MISCELLANEOUS) ×1 IMPLANT
SPONGE LAP 4X18 X RAY DECT (DISPOSABLE) ×2 IMPLANT
STRIP CLOSURE SKIN 1/2X4 (GAUZE/BANDAGES/DRESSINGS) IMPLANT
SUT ETHILON 4 0 PS 2 18 (SUTURE) IMPLANT
SUT MNCRL AB 4-0 PS2 18 (SUTURE) ×2 IMPLANT
SUT SILK 2 0 SH (SUTURE) ×2 IMPLANT
SUT VIC AB 2-0 SH 27 (SUTURE)
SUT VIC AB 2-0 SH 27XBRD (SUTURE) IMPLANT
SUT VIC AB 4-0 P-3 18XBRD (SUTURE) IMPLANT
SUT VIC AB 4-0 P3 18 (SUTURE)
SUT VICRYL 3-0 CR8 SH (SUTURE) ×2 IMPLANT
SYR BULB 3OZ (MISCELLANEOUS) IMPLANT
SYR CONTROL 10ML LL (SYRINGE) ×2 IMPLANT
TAPE HYPAFIX 4 X10 (GAUZE/BANDAGES/DRESSINGS) IMPLANT
TOWEL OR NON WOVEN STRL DISP B (DISPOSABLE) ×2 IMPLANT
TUBE CONNECTING 20X1/4 (TUBING) ×2 IMPLANT
WATER STERILE IRR 1000ML POUR (IV SOLUTION) ×1 IMPLANT
YANKAUER SUCT BULB TIP NO VENT (SUCTIONS) ×2 IMPLANT

## 2013-01-16 NOTE — Interval H&P Note (Signed)
History and Physical Interval Note:  01/16/2013 1:24 PM  Tracy Perez  has presented today for surgery, with the diagnosis of abnormal mammorgram w microcalcifations   The goals and the  various methods of treatment have been discussed with the patient and family. After consideration of risks, benefits and other options for treatment, the patient has consented to  Procedure(s) with comments: PARTIAL MASTECTOMY WITH NEEDLE LOCALIZATION (Left) - left partial mastectomy with needle localization at breast center of [facility]  as a surgical intervention .  The patient's history has been reviewed, patient examined today, no change in status, stable for surgery.  I have reviewed the patient's chart and labs.  Questions were answered to the patient's satisfaction.     [name]

## 2013-01-16 NOTE — Anesthesia Postprocedure Evaluation (Signed)
Anesthesia Post Note  Patient: Tracy Perez  Procedure(s) Performed: Procedure(s) (LRB): PARTIAL MASTECTOMY WITH NEEDLE LOCALIZATION (Left)  Anesthesia type: General  Patient location: PACU  Post pain: Pain level controlled  Post assessment: Patient's Cardiovascular Status Stable  Last Vitals:  Filed Vitals:   01/16/13 1515  BP: 127/84  Pulse: 66  Temp:   Resp: 16    Post vital signs: Reviewed and stable  Level of consciousness: alert  Complications: No apparent anesthesia complications

## 2013-01-16 NOTE — Telephone Encounter (Signed)
The pt called to report that she remembered a previous surgery she had and did not report.  In 1994 she had surgery on her left leg for circulation.  They opened up a vessel to improve the blood flow.  She has surgery today by Dr Derrell Lolling

## 2013-01-16 NOTE — Anesthesia Preprocedure Evaluation (Addendum)
Anesthesia Evaluation  Patient identified by MRN, date of birth, ID band Patient awake    Reviewed: Allergy & Precautions, H&P , NPO status , Patient's Chart, lab work & pertinent test results, reviewed documented beta blocker date and time   Airway Mallampati: II TM Distance: >3 FB Neck ROM: full    Dental   Pulmonary neg pulmonary ROS,  breath sounds clear to auscultation        Cardiovascular negative cardio ROS  Rhythm:regular     Neuro/Psych negative neurological ROS  negative psych ROS   GI/Hepatic negative GI ROS, Neg liver ROS,   Endo/Other  negative endocrine ROS  Renal/GU negative Renal ROS  negative genitourinary   Musculoskeletal   Abdominal   Peds  Hematology negative hematology ROS (+)   Anesthesia Other Findings See surgeon's H&P   Reproductive/Obstetrics negative OB ROS                           Anesthesia Physical Anesthesia Plan  ASA: II  Anesthesia Plan: General   Post-op Pain Management:    Induction: Intravenous  Airway Management Planned: LMA  Additional Equipment:   Intra-op Plan:   Post-operative Plan: Extubation in OR  Informed Consent: I have reviewed the patients History and Physical, chart, labs and discussed the procedure including the risks, benefits and alternatives for the proposed anesthesia with the patient or authorized representative who has indicated his/her understanding and acceptance.   Dental Advisory Given  Plan Discussed with: CRNA and Surgeon  Anesthesia Plan Comments:         Anesthesia Quick Evaluation  

## 2013-01-16 NOTE — Op Note (Signed)
Patient Name:           Tracy Perez   Date of Surgery:        01/16/2013  Pre op Diagnosis:      Microcalcifications, left breast, subareolar area  Post op Diagnosis:    Same  Procedure:                 Left partial mastectomy with needle localization and margin assessment  Surgeon:                     Angelia Mould. Derrell Lolling, M.D., FACS  Assistant:                      None  Operative Indications:   Tracy Perez is a 47 y.o. female. She was referred by Dr. Christiana Pellant at the [facility] for evaluation of microcalcifications, left breast, retroareolar area.  This patient does not perceive a breast mass. She does not have a nipple discharge. She has never had breast surgery. She sometimes gets intermittent tingling in the left breast. Recent mammogram showed a suspicious retroareolar calcifications, too superficial to biopsy, BI-RADS category 4. She has had these problems in the past and I have looked at her prior films. This is the same area, but it looks like a few more calcifications than she had before, but no mass effect. Physical exam of the left breast is unremarkable. Family history reveals an aunt had a lumpectomy but she's not certain of the diagnosis. No other breast or ovarian malignancies noted. Comorbidities include hypertension, anxiety.   Operative Findings:       The focal area of microcalcifications in the left breast, retroareolar area were very superficial but were not involving the dermis. They appeared to be completely removed on the specimen mammogram. The specimen was marked and several areas to orient the pathology for margin assessment.  Procedure in Detail:          The patient underwent wire localization at the breast center [facility] by Dr. Jean Rosenthal. The wire was inserted near the areolar margin laterally and directed medially posterior to the microcalcifications. The patient was brought to [facility]  center and underwent general anesthesia. The left breast was prepped  and draped in a sterile fashion, intravenous antibiotics were given, and a surgical time out was performed. 0.5% Marcaine with epinephrine was used as a local infiltration anesthetic. A curvilinear incision was made at the areolar margin laterally. Dissection was carried down through the dermis into the subcutaneous tissue and then into the breast tissue. With lots of counter traction and traction we dissected under the nipple in a fairly superficial plane being careful to preserve the dermis and the blood supply. We then dissected circumferentially around the localizing wire. We marked the anterior, superior, and lateral margins with silk sutures. Specimen mammogram showed microcalcifications within the center of the specimen. The specimen was sent to the lab. Hemostasis was excellent and achieved with electrocautery. The wound was irrigated with saline. The breast tissue was closed in 2 separate layers with interrupted sutures of 3-0 Vicryl and the skin was closed with a running subcuticular suture of 4-0 Monocryl and Dermabond. The patient tolerated the procedure well and was taken to recovery room stable condition. EBL 10 cc. Counts correct. Complications none.     Angelia Mould. Derrell Lolling, M.D., FACS General and Minimally Invasive Surgery Breast and Colorectal Surgery  01/16/2013 2:32 PM

## 2013-01-16 NOTE — Transfer of Care (Signed)
Immediate Anesthesia Transfer of Care Note  Patient: Tracy Perez  Procedure(s) Performed: Procedure(s) with comments: PARTIAL MASTECTOMY WITH NEEDLE LOCALIZATION (Left) - left partial mastectomy with needle localization at breast center of [place]   Patient Location: PACU  Anesthesia Type:General  Level of Consciousness: awake, alert , oriented and patient cooperative  Airway & Oxygen Therapy: Patient Spontanous Breathing and Patient connected to face mask oxygen  Post-op Assessment: Report given to PACU RN and Post -op Vital signs reviewed and stable  Post vital signs: Reviewed and stable  Complications: No apparent anesthesia complications

## 2013-01-16 NOTE — Anesthesia Procedure Notes (Signed)
Procedure Name: LMA Insertion Date/Time: 01/16/2013 1:47 PM Performed by: Ilai Hiller D Pre-anesthesia Checklist: Patient identified, Emergency Drugs available, Suction available and Patient being monitored Patient Re-evaluated:Patient Re-evaluated prior to inductionOxygen Delivery Method: Circle System Utilized Preoxygenation: Pre-oxygenation with 100% oxygen Intubation Type: IV induction Ventilation: Mask ventilation without difficulty LMA: LMA inserted LMA Size: 4.0 Number of attempts: 1 Airway Equipment and Method: bite block Placement Confirmation: positive ETCO2 Tube secured with: Tape Dental Injury: Teeth and Oropharynx as per pre-operative assessment

## 2013-01-17 ENCOUNTER — Telehealth (INDEPENDENT_AMBULATORY_CARE_PROVIDER_SITE_OTHER): Payer: Self-pay | Admitting: *Deleted

## 2013-01-17 ENCOUNTER — Encounter (HOSPITAL_BASED_OUTPATIENT_CLINIC_OR_DEPARTMENT_OTHER): Payer: Self-pay | Admitting: General Surgery

## 2013-01-17 NOTE — Telephone Encounter (Signed)
Patient called to state she had surgery yesterday and is having increased swelling around the incision site.  Instructed patient to continue to use ice to the site and also to use NSAIDs to help with the swelling but it is normal following a surgical procedure.  Patient denies any signs of infection.

## 2013-01-19 ENCOUNTER — Telehealth (INDEPENDENT_AMBULATORY_CARE_PROVIDER_SITE_OTHER): Payer: Self-pay

## 2013-01-19 ENCOUNTER — Telehealth (INDEPENDENT_AMBULATORY_CARE_PROVIDER_SITE_OTHER): Payer: Self-pay | Admitting: General Surgery

## 2013-01-19 ENCOUNTER — Other Ambulatory Visit (INDEPENDENT_AMBULATORY_CARE_PROVIDER_SITE_OTHER): Payer: Self-pay | Admitting: General Surgery

## 2013-01-19 DIAGNOSIS — R920 Mammographic microcalcification found on diagnostic imaging of breast: Secondary | ICD-10-CM

## 2013-01-19 NOTE — Telephone Encounter (Signed)
Patient called for path results. I do not see them in computer yet. Please call patient once you receive them.

## 2013-01-19 NOTE — Telephone Encounter (Signed)
Please call back she want Dr Derrell Lolling to talk to her husband about what he talked to her about today over the phone with her. Patient is aware that it may be tomorrow before they will receive a call

## 2013-01-19 NOTE — Telephone Encounter (Signed)
Dr. 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 The closest margin was anterior and we have a 2 mm margin. There is no ink on tumor. Breast diagnostic profile and typewritten report are pending.  I called Tracy Perez and discussed this with her. I told her that she should have a breast MRI and move her appointment up with me sooner. I told her that she did not necessarily need a mastectomy unless we found cancer elsewhere in the breast. She expressed understanding. I told her that we would eventually involve a medical oncologist and radiation oncologist.  Our office will arrange the breast MRI and the appointment with me.   Angelia Mould. Derrell Lolling, M.D., [facility] General and Minimally invasive Surgery Breast and Colorectal Surgery Office:   [phone] Pager:   [phone]

## 2013-01-20 ENCOUNTER — Other Ambulatory Visit (INDEPENDENT_AMBULATORY_CARE_PROVIDER_SITE_OTHER): Payer: Self-pay | Admitting: General Surgery

## 2013-01-20 DIAGNOSIS — C50912 Malignant neoplasm of unspecified site of left female breast: Secondary | ICD-10-CM

## 2013-01-21 ENCOUNTER — Telehealth (INDEPENDENT_AMBULATORY_CARE_PROVIDER_SITE_OTHER): Payer: Self-pay | Admitting: General Surgery

## 2013-01-21 NOTE — Telephone Encounter (Signed)
Patient's husband also wanted to discuss diagnosis and plans. I spoke to [name] who brought husband in on a conference call.  I discussed breast DCIS diagnosis, pending breast diagnostic profile,  indications and purpose for MRI, prompt follow up with me, surgical options, referral to med-onc and rad-onc.  They seem to understand .  I will see in office soon.  Tracy Perez. Derrell Lolling, M.D., [facility] General and Minimally invasive Surgery Breast and Colorectal Surgery Office:   [phone] Pager:   [phone]

## 2013-01-24 ENCOUNTER — Ambulatory Visit: Admission: RE | Admit: 2013-01-24 | Payer: BC Managed Care – PPO | Source: Ambulatory Visit

## 2013-01-25 ENCOUNTER — Telehealth (INDEPENDENT_AMBULATORY_CARE_PROVIDER_SITE_OTHER): Payer: Self-pay | Admitting: General Surgery

## 2013-01-25 ENCOUNTER — Other Ambulatory Visit (INDEPENDENT_AMBULATORY_CARE_PROVIDER_SITE_OTHER): Payer: Self-pay | Admitting: General Surgery

## 2013-01-25 DIAGNOSIS — R920 Mammographic microcalcification found on diagnostic imaging of breast: Secondary | ICD-10-CM

## 2013-01-25 DIAGNOSIS — C50919 Malignant neoplasm of unspecified site of unspecified female breast: Secondary | ICD-10-CM

## 2013-01-25 NOTE — Telephone Encounter (Signed)
Pt called to report the MRI with contrast was not completed because she became severely nauseated with vomiting during the procedure.  She called to ask if enough image was obtained or if she will have to repeat it.  Please advise:  [phone].

## 2013-01-25 NOTE — Telephone Encounter (Signed)
After discussion with Dr. Derrell Lolling and [facility], pt does need to repeat attempt for MRI, this week, so Dr. Derrell Lolling will be able to review and present at the Breast Conference.  It was suggested that the test be scheduled in the late morning, after 10:00am, on an empty stomach from the night before.  She may drink Sprite, tea or Gatorade as needed.  Dr. Derrell Lolling ordered her to take OTC Benadryl 50 mg 2 hours before the test to help abate the nausea and for the contrast to be injected more slowly this time.  MRI rescheduled for Saturday, January 28, 2013, at 1:15pm, [address] location.  Pt informed of date, time and location, to take Benadryl and refrain from eating.  She understands all and will comply.

## 2013-01-28 ENCOUNTER — Ambulatory Visit
Admission: RE | Admit: 2013-01-28 | Discharge: 2013-01-28 | Disposition: A | Discharge status: Home / Self Care | Payer: BC Managed Care – PPO | Source: Ambulatory Visit | Attending: General Surgery | Admitting: General Surgery

## 2013-01-28 MED ORDER — GADOBENATE DIMEGLUMINE 529 MG/ML IV SOLN
15.0000 mL | Freq: Once | INTRAVENOUS | Status: AC | PRN
  Administered 2013-01-28: 15 mL via INTRAVENOUS

## 2013-01-31 ENCOUNTER — Other Ambulatory Visit (INDEPENDENT_AMBULATORY_CARE_PROVIDER_SITE_OTHER): Payer: Self-pay

## 2013-01-31 ENCOUNTER — Telehealth (INDEPENDENT_AMBULATORY_CARE_PROVIDER_SITE_OTHER): Payer: Self-pay | Admitting: General Surgery

## 2013-01-31 DIAGNOSIS — D0512 Intraductal carcinoma in situ of left breast: Secondary | ICD-10-CM

## 2013-01-31 NOTE — Telephone Encounter (Signed)
Recent breast MRI shows biopsy changes in the subareolar area, but no other abnormal findings. This strongly suggests that her DCIS is localized and that she does not require further surgery, assuming that she is willing to undergo radiation therapy.  I call these results to her and discussed this with her in detail. She was advised she has the option of lumpectomy, radiation therapy and possibly antiestrogen therapy. She was also told that mastectomy is an option but without survival benefit over lumpectomy and radiation therapy. She seemed interested in discussing this further and in keeping her breast. She was quite nervous and anxious about this and did not want to proceed with referral to medical oncology and radiation oncology until she came into the office to discuss this further with me and her husband present. I told her that was very reasonable.  She has an appointment to see me on March 17 and she will bring her husband with her. We will go over her pathology findings, MRI findings, and options for treatment at that point in time. Ultimately she will need to be referred to medical oncology and radiation oncology.   Angelia Mould. Derrell Lolling, M.D., [facility] General and Minimally invasive Surgery Breast and Colorectal Surgery Office:   [phone] Pager:   [phone]

## 2013-02-01 ENCOUNTER — Telehealth: Payer: Self-pay | Admitting: *Deleted

## 2013-02-01 NOTE — Telephone Encounter (Signed)
Confirmed 02/13/13 appt w/ pt.  Mailed before appt letter & packet to pt.  Emailed [place] Dec to make aware.  Took paperwork to Med [facility] for chart.

## 2013-02-06 ENCOUNTER — Ambulatory Visit (INDEPENDENT_AMBULATORY_CARE_PROVIDER_SITE_OTHER): Payer: BC Managed Care – PPO | Admitting: General Surgery

## 2013-02-06 ENCOUNTER — Encounter (INDEPENDENT_AMBULATORY_CARE_PROVIDER_SITE_OTHER): Payer: Self-pay | Admitting: General Surgery

## 2013-02-06 VITALS — BP 130/88 | HR 77 | Temp 98.4°F | Resp 18 | Ht 67.5 in | Wt 189.8 lb

## 2013-02-06 DIAGNOSIS — D0590 Unspecified type of carcinoma in situ of unspecified breast: Secondary | ICD-10-CM

## 2013-02-06 DIAGNOSIS — D0592 Unspecified type of carcinoma in situ of left breast: Secondary | ICD-10-CM

## 2013-02-06 NOTE — Progress Notes (Signed)
Patient ID: Tracy Perez, female   DOB: 01/14/1966, 47 y.o.   MRN: [id number] History: This patient was referred to me for evaluation and surgical management of suspicious microcalcifications in the left breast, superficial subareolar area. They were too superficial for image guided biopsy. On 10/16/2013 she underwent left partial mastectomy with needle localization. 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 The closest margin was anterior, less than 2 mm, but was negative. Estrogen receptor 100%, progesterone receptor 85%. She essentially had an MRI which shows this to be a solitary finding, only biopsy affect in the left subareolar area. I discussed the pathology report and the MRI report with her and gave her copies. Basically she has no complaints about her breast. Minimal pain. Mild thickening. No drainage.  Exam: Patient looks well. In no distress. Left breast reveals circumareolar incision laterally healing well. A little bit of thickening and edema. The nipple and areola are viable and warm. No hematoma. No sign of infection. Rest of the breast is soft. No axillary mass  Assessment: DCIS with necrosis left breast, superficial subareolar area. Recovering uneventfully following left partial mastectomy with needle localization. ER positive. PR positive. Negative margins.  Plan: We spent a long time talking about options for intervention. We talked about about the differences in lumpectomy and radiation therapy versus mastectomy, antiestrogen therapy. We talked about long-term prognosis and survivorship. At this point in time she would like breast conservation if possible. I feel that is very appropriate in this setting. She'll be referred to medical oncology and radiation oncology immediately. Unless there are problems, I will see her back in 4 months.   Angelia Mould. Derrell Lolling, M.D., [facility] General and Minimally invasive Surgery Breast and  Colorectal Surgery Office:   [phone] Pager:   [phone]

## 2013-02-06 NOTE — Patient Instructions (Signed)
You are recovering from your left breast partial mastectomy without any obvious surgical complications.  We went over the pathology report today which shows ductal carcinoma in situ, hormone receptor positive, negative margins.  We went over the MRI test which shows that this is a solitary finding. There is no other abnormality in either breast and there are no significantly enlarged lymph nodes.  We talked about different options for treating you. At this time we're going to try to avoid a mastectomy which I think is very reasonable. You will be referred to a medical oncologist and a radiation oncologist to coordinate the other forms of  your treatment  Return to see Dr. Derrell Lolling in 4 months. Sooner if there are any problems.

## 2013-02-09 ENCOUNTER — Other Ambulatory Visit: Payer: Self-pay | Admitting: *Deleted

## 2013-02-09 DIAGNOSIS — C50112 Malignant neoplasm of central portion of left female breast: Secondary | ICD-10-CM

## 2013-02-13 ENCOUNTER — Ambulatory Visit: Payer: BC Managed Care – PPO

## 2013-02-13 ENCOUNTER — Encounter: Payer: Self-pay | Admitting: Oncology

## 2013-02-13 ENCOUNTER — Ambulatory Visit (HOSPITAL_BASED_OUTPATIENT_CLINIC_OR_DEPARTMENT_OTHER): Payer: BC Managed Care – PPO | Admitting: Oncology

## 2013-02-13 ENCOUNTER — Other Ambulatory Visit (HOSPITAL_BASED_OUTPATIENT_CLINIC_OR_DEPARTMENT_OTHER): Payer: BC Managed Care – PPO | Admitting: Lab

## 2013-02-13 ENCOUNTER — Encounter: Payer: Self-pay | Admitting: Radiation Oncology

## 2013-02-13 ENCOUNTER — Encounter: Payer: Self-pay | Admitting: *Deleted

## 2013-02-13 VITALS — BP 151/92 | HR 67 | Temp 98.7°F | Resp 20 | Ht 67.5 in | Wt 190.3 lb

## 2013-02-13 DIAGNOSIS — Z170 Estrogen receptor positive status [ER+]: Secondary | ICD-10-CM

## 2013-02-13 DIAGNOSIS — D0592 Unspecified type of carcinoma in situ of left breast: Secondary | ICD-10-CM

## 2013-02-13 DIAGNOSIS — D0590 Unspecified type of carcinoma in situ of unspecified breast: Secondary | ICD-10-CM

## 2013-02-13 DIAGNOSIS — C50112 Malignant neoplasm of central portion of left female breast: Secondary | ICD-10-CM

## 2013-02-13 LAB — CBC WITH DIFFERENTIAL/PLATELET
BASO%: 0.5 % (ref 0.0–2.0)
HCT: 37.3 % (ref 34.8–46.6)
LYMPH%: 37.2 % (ref 14.0–49.7)
MCH: 29.2 pg (ref 25.1–34.0)
MCHC: 33.3 g/dL (ref 31.5–36.0)
MCV: 87.6 fL (ref 79.5–101.0)
MONO#: 0.4 10*3/uL (ref 0.1–0.9)
MONO%: 7.8 % (ref 0.0–14.0)
NEUT%: 50.3 % (ref 38.4–76.8)
Platelets: 273 10*3/uL (ref 145–400)
RBC: 4.25 10*6/uL (ref 3.70–5.45)

## 2013-02-13 LAB — COMPREHENSIVE METABOLIC PANEL (CC13)
ALT: 12 U/L (ref 0–55)
Alkaline Phosphatase: 53 U/L (ref 40–150)
CO2: 26 mEq/L (ref 22–29)
Creatinine: 0.7 mg/dL (ref 0.6–1.1)
Sodium: 141 mEq/L (ref 136–145)
Total Bilirubin: 0.77 mg/dL (ref 0.20–1.20)
Total Protein: 7 g/dL (ref 6.4–8.3)

## 2013-02-13 NOTE — Progress Notes (Signed)
Tracy Perez [id number] 1965/12/24 47 y.o. 02/13/2013 1:59 PM  CC  Iona Hansen, NP [address] [place] [address] Dr. Claud Kelp Dr. 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 Patient is seen in medical oncology for discussion of treatment options.   STAGE:  Left central portion of breast DCIS, ER+/PR+,  TisNxMx S/P lumpectomy  REFERRING PHYSICIAN: Dr. Claud Kelp  HISTORY OF PRESENT ILLNESS:  Tracy Perez is a 47 y.o. female.  With past medical history significant for anxiety and hypertension. She underwent a screening mammogram that showed microcalcifications in the left breast, in the retroareolar area. It was too superficial to biopsy. She therefore had a  left excisional biopsy with needle localization on 12/16/2012. The final pathology revealed ductal carcinoma in situ with necrosis closest margin was anterior less than 2 mm but negative. Tumor was ER +100% PR +85%. She had an MRI that showed a solitary finding with only biopsy affect in the left sub-areolar area. She is now seen in medical oncology for further treatment recommendations. She is without any complaints today.   Past Medical History: Past Medical History  Diagnosis Date  . Anxiety   . Hypertension     Past Surgical History: Past Surgical History  Procedure Laterality Date  . Cesarean section    . Appendectomy    . Leg surgery Left 1994    vascular  . Partial mastectomy with needle localization Left 01/16/2013    Procedure: PARTIAL MASTECTOMY WITH NEEDLE LOCALIZATION;  Surgeon: [name], MD;  Location: [facility];  Service: General;  Laterality: Left;  Left partial mastectomy with needle localization at [facility]     Family History: Family History  Problem Relation Age of Onset  . Cancer Maternal Aunt     breast    Social  History History  Substance Use Topics  . Smoking status: Former Smoker    Quit date: 11/23/1989  . Smokeless tobacco: Not on file  . Alcohol Use: Yes     Comment: rarely    Allergies: Allergies  Allergen Reactions  . Gadolinium Derivatives Nausea And Vomiting    Nausea and vomiting immediately following injection.     Current Medications: Current Outpatient Prescriptions  Medication Sig Dispense Refill  . ALPRAZolam (XANAX) 0.5 MG tablet Take 0.5 mg by mouth as needed.      . chlorthalidone (HYGROTON) 25 MG tablet Take 25 mg by mouth daily.      Marland Kitchen HYDROcodone-acetaminophen (NORCO/VICODIN) 5-325 MG per tablet Take 1-2 tablets by mouth every 4 (four) hours as needed for pain.  30 tablet  1  . cholecalciferol (VITAMIN D) 1000 UNITS tablet Take 1,000 Units by mouth daily.      . ferrous fumarate (HEMOCYTE - 106 MG FE) 325 (106 FE) MG TABS Take 1 tablet by mouth daily.       No current facility-administered medications for this visit.    OB/GYN History: menarche at 82, premenopause, G3P3 age first at first birth 24  Fertility Discussion: N/A Prior History of Cancer: None  Health Maintenance:  Colonoscopy yes 5 years Bone Density no Last PAP smear 2012  ECOG PERFORMANCE STATUS: 0 - Asymptomatic  Genetic Counseling/testing: patient to be seen by Maylon Cos  REVIEW OF SYSTEMS:  A comprehensive review of systems was negative.  PHYSICAL EXAMINATION: Blood pressure 151/92, pulse 67, temperature 98.7 F (  37.1 C), temperature source Oral, resp. rate 20, height 5' 7.5" (1.715 m), weight 190 lb 4.8 oz (86.32 kg).  QIO:NGEXB, healthy, no distress, well nourished and well developed SKIN: skin color, texture, turgor are normal HEAD: Normocephalic EYES: PERRLA, EOMI EARS: External ears normal OROPHARYNX:no exudate and no erythema  NECK: supple, no adenopathy LYMPH:  no palpable lymphadenopathy, no hepatosplenomegaly BREAST:right breast normal without mass, skin or nipple  changes or axillary nodes, surgical scars noted in the left periareolar region. no nipple discharge LUNGS: clear to auscultation  HEART: regular rate & rhythm ABDOMEN:abdomen soft, non-tender, normal bowel sounds and no masses or organomegaly BACK: No CVA tenderness EXTREMITIES:no edema, no clubbing, no cyanosis  NEURO: alert & oriented x 3 with fluent speech, no focal motor/sensory deficits, gait normal     STUDIES/RESULTS: Mr Breast Bilateral W Wo Contrast  01/30/2013  *RADIOLOGY REPORT*  Clinical Data: DCIS at excisional biopsy for immediate left retroareolar calcifications.  The patient experienced nausea after initial administration of IV contrast and imaging was repeated on 01/28/2013 after administration of Benadryl.  The patient experienced no further ill effect.  BILATERAL BREAST MRI WITH AND WITHOUT CONTRAST  Technique: Multiplanar, multisequence MR images of both breasts were obtained prior to and following the intravenous administration of 15ml of Multihance.  Three dimensional images were evaluated at the independent DynaCad workstation.  Comparison:  Prior mammograms at [facility]  Findings: Excisional changes are noted in the immediate retroareolar left breast.  Correlate with pathology results to determine the status of margins, as residual malignancy may not be able to be differentiated from post-biopsy rim enhancement.  There is mild left breast trabecular edema.  A 1 cm central left upper outer quadrant lymph node is likely reactive, without cortical thickening.  Excisional biopsy cavity measures 5.7 x 4.3 x 3.0 cm. No abnormal axillary or internal mammary artery chain lymphadenopathy.  No abnormal T2 hyperintensity in the right breast.  After administration of contrast, there is a mild background parenchymal enhancement type pattern.  Enhancement of an otherwise normal-appearing left upper outer quadrant mid depth intramammary lymph node is incidentally  identified.  There is also apparent enhancement of an otherwise normal-appearing 5 mm left breast nine o'clock location mid depth intramammary lymph node.  No focal abnormal enhancement is identified surrounding the left excisional biopsy area or in the right breast.  IMPRESSION: Expected left retroareolar excisional biopsy change with probable left intramammary reactive lymph nodes.  No dominant area of abnormal enhancement in either breast elsewhere to suggest residual disease, allowing for possible obscuration by post surgical change.  RECOMMENDATION:  Treatment plan  BI-RADS CATEGORY 6:  Known biopsy-proven malignancy - appropriate action should be taken.  THREE-DIMENSIONAL MR IMAGE RENDERING ON INDEPENDENT WORKSTATION:  Three-dimensional MR images were rendered by post-processing of the original MR data on an independent workstation.  The three- dimensional MR images were interpreted, and findings were reported in the accompanying complete MRI report for this study.   Original Report Authenticated By: Christiana Pellant, M.D.    Mm Plc Breast Loc Dev   1st Lesion  Inc Mammo Guide  01/16/2013  *RADIOLOGY REPORT*  Clinical Data:  Suspicious left breast calcifications  NEEDLE LOCALIZATION WITH MAMMOGRAPHIC GUIDANCE AND SPECIMEN RADIOGRAPH  Comparison:  Previous exams.  Patient presents for needle localization prior to surgical excision of left breast calcifications.  I met with the patient and we discussed the procedure of needle localization including benefits and alternatives. We discussed the high likelihood of a  successful procedure. We discussed the risks of the procedure, including infection, bleeding, tissue injury, and further surgery. Informed, written consent was given.  Using mammographic guidance, sterile technique, 2% lidocaine and a 5 cm modified Kopans needle, the cluster of calcifications located within the subareolar portion of the left breast was localized using a lateral approach.  The films are  marked for Dr. Derrell Lolling.  Specimen radiograph was performed at day surgery, and confirms the intact wire and the cluster of calcifications to be centrally located within the tissue sample.  The specimen is marked for pathology.  IMPRESSION: Needle localization of calcifications located within the left breast.  No apparent complications.   Original Report Authenticated By: Rolla Plate, M.D.      LABS:    Chemistry      Component Value Date/Time   NA 141 02/13/2013 1303   NA 140 01/12/2013 1430   K 3.6 02/13/2013 1303   K 4.1 01/12/2013 1430   CL 106 02/13/2013 1303   CL 101 01/12/2013 1430   CO2 26 02/13/2013 1303   CO2 29 01/12/2013 1430   BUN 6.4* 02/13/2013 1303   BUN 9 01/12/2013 1430   CREATININE 0.7 02/13/2013 1303   CREATININE 0.75 01/12/2013 1430      Component Value Date/Time   CALCIUM 9.3 02/13/2013 1303   CALCIUM 10.0 01/12/2013 1430   ALKPHOS 53 02/13/2013 1303   AST 16 02/13/2013 1303   ALT 12 02/13/2013 1303   BILITOT 0.77 02/13/2013 1303      Lab Results  Component Value Date   WBC 5.6 02/13/2013   HGB 12.4 02/13/2013   HCT 37.3 02/13/2013   MCV 87.6 02/13/2013   PLT 273 02/13/2013   PATHOLOGY: ADDITIONAL INFORMATION: PROGNOSTIC INDICATORS - ACIS Results IMMUNOHISTOCHEMICAL AND MORPHOMETRIC ANALYSIS BY THE AUTOMATED CELLULAR IMAGING SYSTEM (ACIS) Estrogen Receptor (Negative, <1%): 100%, POSITIVE, STRONG STAINING INTENSITY Progesterone Receptor (Negative, <1%): 85%, POSITIVE, STRONG STAINING INTENSITY All controls stained appropriately Pecola Leisure MD Pathologist, Electronic Signature ( Signed 01/25/2013) FINAL DIAGNOSIS Diagnosis Breast, lumpectomy, Left central - DUCTAL CARCINOMA IN SITU WITH NECROSIS, SEE COMMENT. - IN SITU CARCINOMA IS LESS THAN 2 MM FROM NEAREST MARGIN (ANTERIOR). - MICROCALCIFICATIONS IDENTIFIED. - SEE TEMPLATE BELOW. Microscopic Comment BREAST, IN SITU CARCINOMA Specimen, including laterality: Left breast Procedure: Lumpectomy Grade of  carcinoma: II-III of III Necrosis: Present 1 of 3 Duplicate copy FINAL for MAILIN, COGLIANESE [phone]) Microscopic Comment(continued) Estimated tumor size: (glass slide measurement): up to 4 mm (multiple foci) Treatment effect: None If present, treatment effect in breast tissue, lymph nodes or both: N/A Distance to closest margin: 2 mm If margin positive, focally or broadly: N/A Breast prognostic profile: Pending and will be reported in an addendum Estrogen receptor: Progesterone receptor: Lymph nodes: Examined: 0 Lymph nodes with metastasis: N/A TNM: pTis, pNX Comments: There are multiple foci of in situ carcinoma identified that span 4 mm in greatest dimension. Slide 1C was reviewed by Dr. Raynald Blend who concurs with the presence of insitu carcinoma. The case ws discussed with Dr. Derrell Lolling on 01/19/2013 (CRR:caf 01/18/13)  ASSESSMENT    47 year old female with  #1 new diagnosis of ductal carcinoma in situ of the central portion of the left breast. She is status post partial mastectomy with the final pathology revealing a DCIS that was ER positive PR positive. Postoperatively she is doing well without any significant problems. Patient is a good candidate for chemoprevention with tamoxifen. We discussed role of tamoxifen in this setting. She understands that is used  to prevent ipsilateral as well as contralateral breast cancers in the future. We discussed benefits as well as risks and complications.  #2 patient and I discussed clinical trial including NSABP B. 43 looking at HER-2/neu receptors and Herceptin therapy in DCIS. She was interested and she was seen by research nurse. She has signed a consent form.  Clinical Trial Eligibility:  NSABP B. 43 Multidisciplinary conference discussion      PLAN:    #1 patient will be seen by radiation oncology.  #2 she will be eligible for genetics and we will make a referral.  #31 once completes her radiation will then plan on starting her  on tamoxifen.       Discussion: Patient is being treated per NCCN breast cancer care guidelines appropriate for stage.0   Thank you so much for allowing me to participate in the care of JAMIRIA LANGILL. I will continue to follow up the patient with you and assist in her care.  All questions were answered. The patient knows to call the clinic with any problems, questions or concerns. We can certainly see the patient much sooner if necessary.  I spent 55 minutes counseling the patient face to face. The total time spent in the appointment was 60 minutes.  Drue Second, MD Medical/Oncology [facility] [phone] (beeper) [phone] (Office)  02/13/2013, 1:59 PM

## 2013-02-13 NOTE — Patient Instructions (Addendum)
We discussed your diagnosis of DCIS and prevention with tamoxifen  We discussed role of radiation and you will be seen by Dr. Basilio Cairo on 3/25  I will see you back after completion of RT

## 2013-02-13 NOTE — Progress Notes (Signed)
Checked in new patient. No financial issues. Patient had her [facility] form

## 2013-02-14 ENCOUNTER — Ambulatory Visit
Admission: RE | Admit: 2013-02-14 | Discharge: 2013-02-14 | Disposition: A | Discharge status: Home / Self Care | Payer: BC Managed Care – PPO | Source: Ambulatory Visit | Attending: Radiation Oncology | Admitting: Radiation Oncology

## 2013-02-14 ENCOUNTER — Encounter: Payer: Self-pay | Admitting: Radiation Oncology

## 2013-02-14 VITALS — BP 144/84 | HR 73 | Temp 98.7°F | Resp 20 | Ht 67.5 in | Wt 192.0 lb

## 2013-02-14 DIAGNOSIS — C50112 Malignant neoplasm of central portion of left female breast: Secondary | ICD-10-CM

## 2013-02-14 DIAGNOSIS — Z170 Estrogen receptor positive status [ER+]: Secondary | ICD-10-CM | POA: Insufficient documentation

## 2013-02-14 DIAGNOSIS — D0590 Unspecified type of carcinoma in situ of unspecified breast: Secondary | ICD-10-CM | POA: Insufficient documentation

## 2013-02-14 DIAGNOSIS — Z853 Personal history of malignant neoplasm of breast: Secondary | ICD-10-CM | POA: Insufficient documentation

## 2013-02-14 HISTORY — DX: Malignant neoplasm of unspecified site of unspecified female breast: C50.919

## 2013-02-14 NOTE — Progress Notes (Signed)
Please see the Nurse Progress Note in the MD Initial Consult Encounter for this patient. 

## 2013-02-14 NOTE — Progress Notes (Signed)
Pt denies pain today but has post op soreness/tenderness of left breast. She denies fatigue, loss of appetite. Husband w/pt today. Pt works in [employer], 3 children.

## 2013-02-14 NOTE — Addendum Note (Signed)
Encounter addended by: Delynn Flavin, RN on: 02/14/2013  3:57 PM<BR>     Documentation filed: Charges VN

## 2013-02-14 NOTE — Progress Notes (Addendum)
Radiation Oncology         [phone] ________________________________  Initial outpatient Consultation  Name: Tracy Perez MRN: [id number]  Date: 02/14/2013  DOB: [birth year]  AO:ZHYQM, Letha Cape, NP  [name], MD   REFERRING PHYSICIAN: [name], MD  DIAGNOSIS: DCIS Left Breast , Stage 0  HISTORY OF PRESENT ILLNESS::Tracy Perez is a 47 y.o. female who was found to have suspicious left breast micro calcifications on screening mammography. She did undergo an MRI on 01/28/2013 of her breasts which demonstrated biopsy change in the left retroareolar region and probable left intramammary reactive lymph nodes. There is no dominant area of abnormal enhancement on either breast. She ultimately underwent left lumpectomy of the central portion of her left breast on 01/16/2013. This demonstrated multifocal grade 2-3 DCIS. This is ER/PR positive with necrosis. Her margin is negative by 1-46mm.  She has enrolled on NSABP B. 43, HER-2/neu results pending. Ultimately, she'll consider antiestrogen therapy with medical oncology after completing radiotherapy. She reports some postoperative tenderness and soreness in her left breast. She denies fatigue or loss of appetite. She works in Market researcher at [employer]   PREVIOUS RADIATION THERAPY: No  PAST MEDICAL HISTORY:  has a past medical history of Anxiety; Hypertension; and Breast cancer (01/16/13).    PAST SURGICAL HISTORY: Past Surgical History  Procedure Laterality Date  . Cesarean section       x 2  . Appendectomy    . Leg surgery Left 1994    vascular  . Partial mastectomy with needle localization Left 01/16/2013    Procedure: PARTIAL MASTECTOMY WITH NEEDLE LOCALIZATION;  Surgeon: [name], MD;  Location: [facility];  Service: General;  Laterality: Left;  Left partial mastectomy with needle localization at [facility]     FAMILY HISTORY: family history includes  Cancer in her maternal aunt.  SOCIAL HISTORY:  reports that she quit smoking about 23 years ago. Her smoking use included Cigarettes. She smoked 0.00 packs per day. She does not have any smokeless tobacco history on file. She reports that  drinks alcohol. She reports that she does not use illicit drugs.  ALLERGIES: Gadolinium derivatives  MEDICATIONS:  Current Outpatient Prescriptions  Medication Sig Dispense Refill  . ALPRAZolam (XANAX) 0.5 MG tablet Take 0.5 mg by mouth as needed.      . chlorthalidone (HYGROTON) 25 MG tablet Take 25 mg by mouth daily.      . cholecalciferol (VITAMIN D) 1000 UNITS tablet Take 1,000 Units by mouth daily.      . ferrous fumarate (HEMOCYTE - 106 MG FE) 325 (106 FE) MG TABS Take 1 tablet by mouth daily.      Marland Kitchen HYDROcodone-acetaminophen (NORCO/VICODIN) 5-325 MG per tablet Take 1-2 tablets by mouth every 4 (four) hours as needed for pain.  30 tablet  1   No current facility-administered medications for this encounter.    REVIEW OF SYSTEMS:    Pertinent items are noted in HPI.   PHYSICAL EXAM:  height is 5' 7.5" (1.715 m) and weight is 192 lb (87.091 kg). Her oral temperature is 98.7 F (37.1 C). Her blood pressure is 144/84 and her pulse is 73. Her respiration is 20.   General: Alert and oriented, in no acute distress HEENT: Head is normocephalic. Pupils are equally round and reactive to light. Extraocular movements are intact. Oropharynx is clear. Neck: Neck is supple, no palpable cervical or supraclavicular lymphadenopathy. Heart: Regular in rate  and rhythm with no murmurs, rubs, or gallops. Chest: Clear to auscultation bilaterally, with no rhonchi, wheezes, or rales. Abdomen: Soft, nontender, nondistended, with no rigidity or guarding. Extremities: No cyanosis or edema. Lymphatics: No concerning lymphadenopathy. Skin: No concerning lesions. Musculoskeletal: symmetric strength and muscle tone throughout. Neurologic: Cranial nerves II through XII are  grossly intact. No obvious focalities. Speech is fluent. Coordination is intact. Psychiatric: Judgment and insight are intact. Affect is appropriate. Breasts: firmness under left areola, consistent with post operative swelling - no axillary adenopathy bilaterally, nor any lesion of concern appreciated in either breast   LABORATORY DATA:  Lab Results  Component Value Date   WBC 5.6 02/13/2013   HGB 12.4 02/13/2013   HCT 37.3 02/13/2013   MCV 87.6 02/13/2013   PLT 273 02/13/2013   CMP     Component Value Date/Time   NA 141 02/13/2013 1303   NA 140 01/12/2013 1430   K 3.6 02/13/2013 1303   K 4.1 01/12/2013 1430   CL 106 02/13/2013 1303   CL 101 01/12/2013 1430   CO2 26 02/13/2013 1303   CO2 29 01/12/2013 1430   GLUCOSE 80 02/13/2013 1303   GLUCOSE 77 01/12/2013 1430   BUN 6.4* 02/13/2013 1303   BUN 9 01/12/2013 1430   CREATININE 0.7 02/13/2013 1303   CREATININE 0.75 01/12/2013 1430   CALCIUM 9.3 02/13/2013 1303   CALCIUM 10.0 01/12/2013 1430   PROT 7.0 02/13/2013 1303   ALBUMIN 3.7 02/13/2013 1303   AST 16 02/13/2013 1303   ALT 12 02/13/2013 1303   ALKPHOS 53 02/13/2013 1303   BILITOT 0.77 02/13/2013 1303   GFRNONAA >90 01/12/2013 1430   GFRAA >90 01/12/2013 1430         RADIOGRAPHY: Mr Breast Bilateral W Wo Contrast  01/30/2013  *RADIOLOGY REPORT*  Clinical Data: DCIS at excisional biopsy for immediate left retroareolar calcifications.  The patient experienced nausea after initial administration of IV contrast and imaging was repeated on 01/28/2013 after administration of Benadryl.  The patient experienced no further ill effect.  BILATERAL BREAST MRI WITH AND WITHOUT CONTRAST  Technique: Multiplanar, multisequence MR images of both breasts were obtained prior to and following the intravenous administration of 15ml of Multihance.  Three dimensional images were evaluated at the independent DynaCad workstation.  Comparison:  Prior mammograms at [facility]  Findings: Excisional  changes are noted in the immediate retroareolar left breast.  Correlate with pathology results to determine the status of margins, as residual malignancy may not be able to be differentiated from post-biopsy rim enhancement.  There is mild left breast trabecular edema.  A 1 cm central left upper outer quadrant lymph node is likely reactive, without cortical thickening.  Excisional biopsy cavity measures 5.7 x 4.3 x 3.0 cm. No abnormal axillary or internal mammary artery chain lymphadenopathy.  No abnormal T2 hyperintensity in the right breast.  After administration of contrast, there is a mild background parenchymal enhancement type pattern.  Enhancement of an otherwise normal-appearing left upper outer quadrant mid depth intramammary lymph node is incidentally identified.  There is also apparent enhancement of an otherwise normal-appearing 5 mm left breast nine o'clock location mid depth intramammary lymph node.  No focal abnormal enhancement is identified surrounding the left excisional biopsy area or in the right breast.  IMPRESSION: Expected left retroareolar excisional biopsy change with probable left intramammary reactive lymph nodes.  No dominant area of abnormal enhancement in either breast elsewhere to suggest residual disease, allowing for possible obscuration  by post surgical change.  RECOMMENDATION:  Treatment plan  BI-RADS CATEGORY 6:  Known biopsy-proven malignancy - appropriate action should be taken.  THREE-DIMENSIONAL MR IMAGE RENDERING ON INDEPENDENT WORKSTATION:  Three-dimensional MR images were rendered by post-processing of the original MR data on an independent workstation.  The three- dimensional MR images were interpreted, and findings were reported in the accompanying complete MRI report for this study.   Original Report Authenticated By: Christiana Pellant, M.D.    Mm Plc Breast Loc Dev   1st Lesion  Inc Mammo Guide  01/16/2013  *RADIOLOGY REPORT*  Clinical Data:  Suspicious left breast  calcifications  NEEDLE LOCALIZATION WITH MAMMOGRAPHIC GUIDANCE AND SPECIMEN RADIOGRAPH  Comparison:  Previous exams.  Patient presents for needle localization prior to surgical excision of left breast calcifications.  I met with the patient and we discussed the procedure of needle localization including benefits and alternatives. We discussed the high likelihood of a successful procedure. We discussed the risks of the procedure, including infection, bleeding, tissue injury, and further surgery. Informed, written consent was given.  Using mammographic guidance, sterile technique, 2% lidocaine and a 5 cm modified Kopans needle, the cluster of calcifications located within the subareolar portion of the left breast was localized using a lateral approach.  The films are marked for Dr. Derrell Lolling.  Specimen radiograph was performed at day surgery, and confirms the intact wire and the cluster of calcifications to be centrally located within the tissue sample.  The specimen is marked for pathology.  IMPRESSION: Needle localization of calcifications located within the left breast.  No apparent complications.   Original Report Authenticated By: Rolla Plate, M.D.       IMPRESSION/PLAN: It was a pleasure meeting the patient today. We discussed the risks, benefits, and side effects of radiotherapy in the setting of postoperative DCIS. We discussed that radiation would take approximately 6 weeks to complete and that radiotherapy will be held until her status in NSABP B. 43 is determined. We spoke about acute effects including skin irritation and fatigue as well as much less common late effects including lung and heart irritation. We spoke about the latest technology that is used to minimize the risk of late effects for breast cancer patients undergoing radiotherapy. No guarantees of treatment were given. The patient is enthusiastic about proceeding with treatment. I look forward to participating in the patient's care.  I  have contacted [facility], to verify if any postoperative mammography is necessary to rule out residual microcalcifications. They will get back in touch with me once they have reviewed her images.  Simulation to take place on 02/24/13 with start of whole breast RT about a week thereafter.   I spent 55 minutes minutes face to face with the patient and more than 50% of that time was spent in counseling and/or coordination of care.  [facility] received her case and does not feel that post -op mammography is needed.     __________________________________________   Lonie Peak, MD

## 2013-02-16 ENCOUNTER — Telehealth: Payer: Self-pay | Admitting: Oncology

## 2013-02-16 NOTE — Telephone Encounter (Signed)
S/w pt re appt for 6/23. D/t/KK per 3/24.

## 2013-02-17 NOTE — Addendum Note (Signed)
Encounter addended by: Rayford Williamsen Mintz Izel Hochberg, RN on: 02/17/2013  6:28 PM<BR>     Documentation filed: Charges VN

## 2013-02-24 ENCOUNTER — Ambulatory Visit
Admission: RE | Admit: 2013-02-24 | Discharge: 2013-02-24 | Disposition: A | Discharge status: Home / Self Care | Payer: BC Managed Care – PPO | Source: Ambulatory Visit | Attending: Radiation Oncology | Admitting: Radiation Oncology

## 2013-02-24 DIAGNOSIS — L988 Other specified disorders of the skin and subcutaneous tissue: Secondary | ICD-10-CM | POA: Insufficient documentation

## 2013-02-24 DIAGNOSIS — D0590 Unspecified type of carcinoma in situ of unspecified breast: Secondary | ICD-10-CM | POA: Insufficient documentation

## 2013-02-24 DIAGNOSIS — L259 Unspecified contact dermatitis, unspecified cause: Secondary | ICD-10-CM | POA: Insufficient documentation

## 2013-02-24 DIAGNOSIS — B373 Candidiasis of vulva and vagina: Secondary | ICD-10-CM | POA: Insufficient documentation

## 2013-02-24 DIAGNOSIS — B3731 Acute candidiasis of vulva and vagina: Secondary | ICD-10-CM | POA: Insufficient documentation

## 2013-02-24 DIAGNOSIS — C50112 Malignant neoplasm of central portion of left female breast: Secondary | ICD-10-CM

## 2013-02-24 DIAGNOSIS — Y842 Radiological procedure and radiotherapy as the cause of abnormal reaction of the patient, or of later complication, without mention of misadventure at the time of the procedure: Secondary | ICD-10-CM | POA: Insufficient documentation

## 2013-02-24 DIAGNOSIS — Z510 Encounter for antineoplastic radiation therapy: Secondary | ICD-10-CM | POA: Insufficient documentation

## 2013-02-24 NOTE — Progress Notes (Signed)
  Radiation Oncology         (336) [phone] ________________________________  Name: Tracy Perez MRN: [id number]  Date: 02/24/2013  DOB: 1966/07/05  SIMULATION AND TREATMENT PLANNING NOTE  outpatient  DIAGNOSIS:  Left breast DCIS  NARRATIVE:  The patient was brought to the CT Simulation planning suite.  Identity was confirmed.  All relevant records and images related to the planned course of therapy were reviewed.  The patient freely provided informed written consent to proceed with treatment after reviewing the details related to the planned course of therapy. The consent form was witnessed and verified by the simulation staff.    Then, the patient was set-up in a stable reproducible  supine position for radiation therapy- head in ACCUFORM.   RESPIRATORY MOTION MANAGEMENT SIMULATION  NARRATIVE:  In order to account for effect of respiratory motion on target structures and other organs (ie heart) in the planning and delivery of radiotherapy, this patient underwent respiratory motion management simulation.  To accomplish this, when the patient was brought to the CT simulation planning suite, 4D respiratory motion management CT images were obtained.  The CT images were loaded into the planning software.   Avoidance structures were contoured.  Treatment planning then occurred.     TREATMENT PLANNING NOTE: Treatment planning then occurred.  The radiation prescription was entered and confirmed.    A total of 3 medically necessary complex treatment devices were fabricated and supervised by me: 2 fields with MLCs, and accuform. I have requested : DVH of heart, lungs, lumpectomy cavity and 3D conformal planning to spare heart and lungs.   The patient will receive 50.4 Gy in 28 fractions to the whole left breast with opposed tangents.  She will then receive a boost with electrons to the lumpectomy cavity of 10 Gy in 5 fractions.   -----------------------------------  Lonie Peak, MD

## 2013-03-03 ENCOUNTER — Ambulatory Visit
Admission: RE | Admit: 2013-03-03 | Discharge: 2013-03-03 | Disposition: A | Discharge status: Home / Self Care | Payer: BC Managed Care – PPO | Source: Ambulatory Visit | Attending: Radiation Oncology | Admitting: Radiation Oncology

## 2013-03-03 DIAGNOSIS — C50112 Malignant neoplasm of central portion of left female breast: Secondary | ICD-10-CM

## 2013-03-06 ENCOUNTER — Ambulatory Visit
Admission: RE | Admit: 2013-03-06 | Discharge: 2013-03-06 | Disposition: A | Discharge status: Home / Self Care | Payer: BC Managed Care – PPO | Source: Ambulatory Visit | Attending: Radiation Oncology | Admitting: Radiation Oncology

## 2013-03-06 VITALS — BP 141/96 | HR 73 | Temp 98.8°F | Wt 193.3 lb

## 2013-03-06 DIAGNOSIS — C50112 Malignant neoplasm of central portion of left female breast: Secondary | ICD-10-CM

## 2013-03-06 DIAGNOSIS — D0592 Unspecified type of carcinoma in situ of left breast: Secondary | ICD-10-CM

## 2013-03-06 MED ORDER — ALRA NON-METALLIC DEODORANT (RAD-ONC)
1.0000 "application " | Freq: Once | TOPICAL | Status: AC
  Administered 2013-03-06: 1 via TOPICAL

## 2013-03-06 MED ORDER — RADIAPLEXRX EX GEL
Freq: Once | CUTANEOUS | Status: AC
  Administered 2013-03-06: 18:00:00 via TOPICAL

## 2013-03-06 NOTE — Progress Notes (Signed)
Simulation Verification Note - left breast, outpatient  The patient was brought to the treatment unit and placed in the planned treatment position. The clinical setup was verified. Then port films were obtained and uploaded to the radiation oncology medical record software.  The treatment beams were carefully compared against the planned radiation fields. The position location and shape of the radiation fields was reviewed. They targeted volume of tissue appears to be appropriately covered by the radiation beams. Organs at risk appear to be excluded as planned.  Based on my personal review, I approved the simulation verification. The patient's treatment will proceed as planned.  -----------------------------------  Lonie Peak, MD

## 2013-03-06 NOTE — Addendum Note (Signed)
Encounter addended by: Delynn Flavin, RN on: 03/06/2013  5:45 PM<BR>     Documentation filed: Inpatient MAR

## 2013-03-06 NOTE — Progress Notes (Signed)
Patient here for routine weekly assessment of radiation to left breast.Given Radiation therapy and You booklet , alra deodorant and radiaplex.Routine of clinic reviewed and side effects of treatment to include skin changes, fatigue and pain.

## 2013-03-06 NOTE — Progress Notes (Signed)
   Weekly Management Note:  outpatient Current Dose:  1.8 Gy  Projected Dose: 60.4 Gy   Narrative:  The patient presents for routine under treatment assessment.  CBCT/MVCT images/Port film x-rays were reviewed.  The chart was checked. Doing well. No complaints   Physical Findings:  weight is 193 lb 4.8 oz (87.68 kg). Her temperature is 98.8 F (37.1 C). Her blood pressure is 141/96 and her pulse is 73.  NAD  Impression:  The patient is tolerating radiotherapy.  Plan:  Continue radiotherapy as planned.  ________________________________   Lonie Peak, M.D.

## 2013-03-07 ENCOUNTER — Ambulatory Visit
Admission: RE | Admit: 2013-03-07 | Discharge: 2013-03-07 | Disposition: A | Discharge status: Home / Self Care | Payer: BC Managed Care – PPO | Source: Ambulatory Visit | Attending: Radiation Oncology | Admitting: Radiation Oncology

## 2013-03-08 ENCOUNTER — Ambulatory Visit
Admission: RE | Admit: 2013-03-08 | Discharge: 2013-03-08 | Disposition: A | Discharge status: Home / Self Care | Payer: BC Managed Care – PPO | Source: Ambulatory Visit | Attending: Radiation Oncology | Admitting: Radiation Oncology

## 2013-03-09 ENCOUNTER — Ambulatory Visit
Admission: RE | Admit: 2013-03-09 | Discharge: 2013-03-09 | Disposition: A | Discharge status: Home / Self Care | Payer: BC Managed Care – PPO | Source: Ambulatory Visit | Attending: Radiation Oncology | Admitting: Radiation Oncology

## 2013-03-10 ENCOUNTER — Ambulatory Visit
Admission: RE | Admit: 2013-03-10 | Discharge: 2013-03-10 | Disposition: A | Discharge status: Home / Self Care | Payer: BC Managed Care – PPO | Source: Ambulatory Visit | Attending: Radiation Oncology | Admitting: Radiation Oncology

## 2013-03-13 ENCOUNTER — Ambulatory Visit
Admission: RE | Admit: 2013-03-13 | Payer: BC Managed Care – PPO | Source: Ambulatory Visit | Admitting: Radiation Oncology

## 2013-03-13 ENCOUNTER — Ambulatory Visit
Admission: RE | Admit: 2013-03-13 | Discharge: 2013-03-13 | Disposition: A | Discharge status: Home / Self Care | Payer: BC Managed Care – PPO | Source: Ambulatory Visit | Attending: Radiation Oncology | Admitting: Radiation Oncology

## 2013-03-13 ENCOUNTER — Encounter: Payer: Self-pay | Admitting: Oncology

## 2013-03-13 VITALS — BP 124/87 | HR 84 | Temp 98.3°F | Ht 67.5 in | Wt 194.0 lb

## 2013-03-13 DIAGNOSIS — C50112 Malignant neoplasm of central portion of left female breast: Secondary | ICD-10-CM

## 2013-03-13 NOTE — Progress Notes (Signed)
Tracy Perez here for weekly under treatment visit.  She denies pain and fatigue.  She states that her left breast does feel heavy and swollen.  She does have slight hyperpigmentation on her left breast.  She is also concerned about eczema on her neck, back and arms.  She is also having anxiety and had two attacks last week.  She is a little tearful today and states that it has finally sunk in that she is being treated for cancer.  She also has a yeast infection and wondering if it is Ok to treat it during radiation treatment.

## 2013-03-14 ENCOUNTER — Ambulatory Visit
Admission: RE | Admit: 2013-03-14 | Discharge: 2013-03-14 | Disposition: A | Discharge status: Home / Self Care | Payer: BC Managed Care – PPO | Source: Ambulatory Visit | Attending: Radiation Oncology | Admitting: Radiation Oncology

## 2013-03-14 NOTE — Progress Notes (Signed)
   Weekly Management Note:  outpatient Current Dose:  10.8 Gy  Projected Dose: 60.4 Gy   Narrative:  The patient presents for routine under treatment assessment.  CBCT/MVCT images/Port film x-rays were reviewed.  The chart was checked. A little tearful re: diagnosis. Eczema on arms. Vaginal yeast infection. Breast swollen.  Physical Findings:  height is 5' 7.5" (1.715 m) and weight is 194 lb (87.998 kg). Her temperature is 98.3 F (36.8 C). Her blood pressure is 124/87 and her pulse is 84.   Left breast - probably persistent seroma. No excessive warmth. No skin changes.   Impression:  The patient is tolerating radiotherapy.  Plan:  Continue radiotherapy as planned. Ok to use any lotions outside tx area for eczema. Monistat fine for vag yeast infection.  Pt to seek out her former counselor for support.  I explained this is pre-cancerous condition, she has great prognosis.  ________________________________   Lonie Peak, M.D.

## 2013-03-15 ENCOUNTER — Ambulatory Visit
Admission: RE | Admit: 2013-03-15 | Discharge: 2013-03-15 | Disposition: A | Discharge status: Home / Self Care | Payer: BC Managed Care – PPO | Source: Ambulatory Visit | Attending: Radiation Oncology | Admitting: Radiation Oncology

## 2013-03-16 ENCOUNTER — Ambulatory Visit
Admission: RE | Admit: 2013-03-16 | Discharge: 2013-03-16 | Disposition: A | Discharge status: Home / Self Care | Payer: BC Managed Care – PPO | Source: Ambulatory Visit | Attending: Radiation Oncology | Admitting: Radiation Oncology

## 2013-03-17 ENCOUNTER — Ambulatory Visit
Admission: RE | Admit: 2013-03-17 | Discharge: 2013-03-17 | Disposition: A | Discharge status: Home / Self Care | Payer: BC Managed Care – PPO | Source: Ambulatory Visit | Attending: Radiation Oncology | Admitting: Radiation Oncology

## 2013-03-20 ENCOUNTER — Ambulatory Visit
Admission: RE | Admit: 2013-03-20 | Discharge: 2013-03-20 | Disposition: A | Discharge status: Home / Self Care | Payer: BC Managed Care – PPO | Source: Ambulatory Visit | Attending: Radiation Oncology | Admitting: Radiation Oncology

## 2013-03-20 VITALS — BP 116/84 | HR 100 | Temp 98.6°F | Ht 67.5 in | Wt 191.0 lb

## 2013-03-20 DIAGNOSIS — C50912 Malignant neoplasm of unspecified site of left female breast: Secondary | ICD-10-CM

## 2013-03-20 NOTE — Progress Notes (Signed)
Weekly Management Note:  Site: Left breast Current Dose:  1980  cGy Projected Dose: 5040  cGy  Narrative: The patient is seen today for routine under treatment assessment. CBCT/MVCT images/port films were reviewed. The chart was reviewed.   She is without complaints today except for mild to moderate fatigue. She uses Radioplex gel.  Physical Examination:  Filed Vitals:   03/20/13 1607  BP: 116/84  Pulse: 100  Temp: 98.6 F (37 C)  .  Weight: 191 lb (86.637 kg). There is hyperpigmentation the skin along her left breast with no areas of desquamation.  Impression: Tolerating radiation therapy well. She inquires as to whether not her fatigue worsen, and whether not she would need to be out of work. She needs to work. I told her that her fatigue should not significantly worsen, she probably will be able to continue with her employment. She works on an [employer] stage [employer].  Plan: Continue radiation therapy as planned.

## 2013-03-20 NOTE — Progress Notes (Signed)
Tracy Perez here for weekly under treat visit.  She has had 11/28 fractions to her left breast.  She denies pain.  She is feeling "sleepy."  She also has a question as to whether she can take vitamin D and iron during radiation treatment.  She also is wondering how long is allowed for a leave of absence from work since she is very tired.  The skin on her left breast is intact and she does have some hyperpigmentation on her left breast and she states that it is a little itchy underneath her breast.

## 2013-03-21 ENCOUNTER — Encounter: Payer: Self-pay | Admitting: *Deleted

## 2013-03-21 ENCOUNTER — Ambulatory Visit
Admission: RE | Admit: 2013-03-21 | Discharge: 2013-03-21 | Disposition: A | Discharge status: Home / Self Care | Payer: BC Managed Care – PPO | Source: Ambulatory Visit | Attending: Radiation Oncology | Admitting: Radiation Oncology

## 2013-03-21 DIAGNOSIS — C50112 Malignant neoplasm of central portion of left female breast: Secondary | ICD-10-CM

## 2013-03-21 MED ORDER — ALRA NON-METALLIC DEODORANT (RAD-ONC)
1.0000 "application " | Freq: Once | TOPICAL | Status: AC
  Administered 2013-03-21: 1 via TOPICAL

## 2013-03-21 MED ORDER — RADIAPLEXRX EX GEL
Freq: Once | CUTANEOUS | Status: AC
  Administered 2013-03-21: 16:00:00 via TOPICAL

## 2013-03-21 NOTE — Progress Notes (Signed)
Mailed after appt letter to pt. 

## 2013-03-22 ENCOUNTER — Ambulatory Visit
Admission: RE | Admit: 2013-03-22 | Discharge: 2013-03-22 | Disposition: A | Discharge status: Home / Self Care | Payer: BC Managed Care – PPO | Source: Ambulatory Visit | Attending: Radiation Oncology | Admitting: Radiation Oncology

## 2013-03-23 ENCOUNTER — Ambulatory Visit
Admission: RE | Admit: 2013-03-23 | Discharge: 2013-03-23 | Disposition: A | Discharge status: Home / Self Care | Payer: BC Managed Care – PPO | Source: Ambulatory Visit | Attending: Radiation Oncology | Admitting: Radiation Oncology

## 2013-03-24 ENCOUNTER — Ambulatory Visit
Admission: RE | Admit: 2013-03-24 | Discharge: 2013-03-24 | Disposition: A | Discharge status: Home / Self Care | Payer: BC Managed Care – PPO | Source: Ambulatory Visit | Attending: Radiation Oncology | Admitting: Radiation Oncology

## 2013-03-27 ENCOUNTER — Ambulatory Visit
Admission: RE | Admit: 2013-03-27 | Discharge: 2013-03-27 | Disposition: A | Discharge status: Home / Self Care | Payer: BC Managed Care – PPO | Source: Ambulatory Visit | Attending: Radiation Oncology | Admitting: Radiation Oncology

## 2013-03-27 ENCOUNTER — Encounter: Payer: Self-pay | Admitting: Radiation Oncology

## 2013-03-27 VITALS — BP 115/76 | HR 83 | Temp 98.5°F | Wt 190.9 lb

## 2013-03-27 DIAGNOSIS — C50112 Malignant neoplasm of central portion of left female breast: Secondary | ICD-10-CM

## 2013-03-27 NOTE — Progress Notes (Signed)
   Weekly Management Note:  outpatient Current Dose:  28.8 Gy  Projected Dose: 60.4 Gy   Narrative:  The patient presents for routine under treatment assessment.  CBCT/MVCT images/Port film x-rays were reviewed.  The chart was checked. Doing well but tired.  Physical Findings:  weight is 190 lb 14.4 oz (86.592 kg). Her temperature is 98.5 F (36.9 C). Her blood pressure is 115/76 and her pulse is 83.  hyperpigmented left breast, skin intact  Impression:  The patient is tolerating radiotherapy.  Plan:  Continue radiotherapy as planned. Patient wants to lose weight after completing RT.  Talked with her about [employer] programs and [employer], will also refer to our nutritionist.  ________________________________   Lonie Peak, M.D.

## 2013-03-27 NOTE — Progress Notes (Signed)
Tracy Perez has received 16 fractions to her left breast.  She c/o  Tenderness in the  left lateral breast.  Skin with erythema and hyperpigmentation , but skin intact.  She also reports fatigue and note it at it's on last Friday and Saturday.

## 2013-03-28 ENCOUNTER — Ambulatory Visit
Admission: RE | Admit: 2013-03-28 | Discharge: 2013-03-28 | Disposition: A | Discharge status: Home / Self Care | Payer: BC Managed Care – PPO | Source: Ambulatory Visit | Attending: Radiation Oncology | Admitting: Radiation Oncology

## 2013-03-29 ENCOUNTER — Ambulatory Visit
Admission: RE | Admit: 2013-03-29 | Discharge: 2013-03-29 | Disposition: A | Discharge status: Home / Self Care | Payer: BC Managed Care – PPO | Source: Ambulatory Visit | Attending: Radiation Oncology | Admitting: Radiation Oncology

## 2013-03-30 ENCOUNTER — Ambulatory Visit: Payer: BC Managed Care – PPO | Admitting: Nutrition

## 2013-03-30 ENCOUNTER — Ambulatory Visit
Admission: RE | Admit: 2013-03-30 | Discharge: 2013-03-30 | Disposition: A | Discharge status: Home / Self Care | Payer: BC Managed Care – PPO | Source: Ambulatory Visit | Attending: Radiation Oncology | Admitting: Radiation Oncology

## 2013-03-30 NOTE — Progress Notes (Signed)
Patient is a 47 year old female diagnosed with breast cancer. She is a patient of Dr. Basilio Cairo. Past medical history includes anxiety and hypertension.  Medications include Xanax and vitamin D.  Labs were reviewed.  Height: 67.5 inches. Weight: 190.9 pounds. Usual body weight: 190 pounds. BMI: 29.44.  Patient reports she wants to improve her eating habits. She would like to lose weight after her treatment is completed. Patient describes oral intake as consisting primarily of fast food at breakfast and lunch. She does occasionally cook dinner. She snacks on candy and chips. Patient reports she loves fruits and vegetables however does not feel that she has time to consume these.  Nutrition diagnosis: Overweight/obesity related to excessive energy intake and physical inactivity as evidenced by BMI of 29.44.  Intervention: I educated patient on the importance of increasing plant-based diet consisting of fruits, vegetables, whole grains, and lean protein sources. I have given her strategies for making improvements in her daily meal intake. I have provided suggestions of ways she could incorporate more plant-based foods. I have educated her to begin a food journal to identify when she is eating and why she is eating in addition to what she is eating. I've also educated patient on computer applications that she could utilize to help keep her on track. We briefly discussed the importance of exercise and the [facility]. Fact sheets provided. Teach back method used.  Monitoring, evaluation, goals: Patient will tolerate a healthy plant-based diet to promote slow, safe weight loss.  Next visit: No followup is scheduled at this time.

## 2013-03-31 ENCOUNTER — Ambulatory Visit
Admission: RE | Admit: 2013-03-31 | Discharge: 2013-03-31 | Disposition: A | Discharge status: Home / Self Care | Payer: BC Managed Care – PPO | Source: Ambulatory Visit | Attending: Radiation Oncology | Admitting: Radiation Oncology

## 2013-04-03 ENCOUNTER — Ambulatory Visit
Admission: RE | Admit: 2013-04-03 | Discharge: 2013-04-03 | Disposition: A | Discharge status: Home / Self Care | Payer: BC Managed Care – PPO | Source: Ambulatory Visit | Attending: Radiation Oncology | Admitting: Radiation Oncology

## 2013-04-03 VITALS — BP 116/82 | HR 77 | Temp 98.2°F | Wt 193.7 lb

## 2013-04-03 DIAGNOSIS — C50112 Malignant neoplasm of central portion of left female breast: Secondary | ICD-10-CM

## 2013-04-03 NOTE — Progress Notes (Signed)
Tracy Perez here for weekly under treat visit.  She has had 21/28 fractions to her left breast.  She denies pain and nausea.  She does have fatigue.  The skin on her left breast and underarm have hyperpigmentation.  She states that it is itchy and is sore under the breast, around her nipple and under her left arm.  She is applying Radiaplex gel twice a day.

## 2013-04-04 ENCOUNTER — Ambulatory Visit
Admission: RE | Admit: 2013-04-04 | Discharge: 2013-04-04 | Disposition: A | Discharge status: Home / Self Care | Payer: BC Managed Care – PPO | Source: Ambulatory Visit | Attending: Radiation Oncology | Admitting: Radiation Oncology

## 2013-04-04 NOTE — Progress Notes (Signed)
   Weekly Management Note:  Outpatient Current Dose:  37.8 Gy  Projected Dose: 50.4 Gy + 10 Gy boost  Narrative:  The patient presents for routine under treatment assessment.  CBCT/MVCT images/Port film x-rays were reviewed.  The chart was checked. She has no new complaints  Physical Findings:  weight is 193 lb 11.2 oz (87.862 kg). Her temperature is 98.2 F (36.8 C). Her blood pressure is 116/82 and her pulse is 77.  Increased hyperpigmentation of left breast and axilla.  Impression:  The patient is tolerating radiotherapy.  Plan:  Continue radiotherapy as planned.  ________________________________   Lonie Peak, M.D.

## 2013-04-05 ENCOUNTER — Ambulatory Visit: Payer: BC Managed Care – PPO | Admitting: Radiation Oncology

## 2013-04-05 ENCOUNTER — Encounter: Payer: Self-pay | Admitting: Radiation Oncology

## 2013-04-05 ENCOUNTER — Ambulatory Visit
Admission: RE | Admit: 2013-04-05 | Discharge: 2013-04-05 | Disposition: A | Discharge status: Home / Self Care | Payer: BC Managed Care – PPO | Source: Ambulatory Visit | Attending: Radiation Oncology | Admitting: Radiation Oncology

## 2013-04-06 ENCOUNTER — Ambulatory Visit
Admission: RE | Admit: 2013-04-06 | Discharge: 2013-04-06 | Disposition: A | Discharge status: Home / Self Care | Payer: BC Managed Care – PPO | Source: Ambulatory Visit | Attending: Radiation Oncology | Admitting: Radiation Oncology

## 2013-04-07 ENCOUNTER — Ambulatory Visit
Admission: RE | Admit: 2013-04-07 | Discharge: 2013-04-07 | Disposition: A | Discharge status: Home / Self Care | Payer: BC Managed Care – PPO | Source: Ambulatory Visit | Attending: Radiation Oncology | Admitting: Radiation Oncology

## 2013-04-07 ENCOUNTER — Encounter: Payer: Self-pay | Admitting: Radiation Oncology

## 2013-04-07 ENCOUNTER — Ambulatory Visit
Admission: RE | Admit: 2013-04-07 | Payer: BC Managed Care – PPO | Source: Ambulatory Visit | Admitting: Radiation Oncology

## 2013-04-10 ENCOUNTER — Ambulatory Visit
Admission: RE | Admit: 2013-04-10 | Discharge: 2013-04-10 | Disposition: A | Discharge status: Home / Self Care | Payer: BC Managed Care – PPO | Source: Ambulatory Visit | Attending: Radiation Oncology | Admitting: Radiation Oncology

## 2013-04-10 NOTE — Progress Notes (Signed)
Assessed left axilla desquamated area which has peeled.area cleaned and then neosporin applied.Informed to clean 2 to 3 times daily and apply triple antibiotic ointment or neosporin and cover with telfa.To reassess on Wednesday but knows to come by on Tuesday if needed.

## 2013-04-11 ENCOUNTER — Ambulatory Visit
Admission: RE | Admit: 2013-04-11 | Discharge: 2013-04-11 | Disposition: A | Discharge status: Home / Self Care | Payer: BC Managed Care – PPO | Source: Ambulatory Visit | Attending: Radiation Oncology | Admitting: Radiation Oncology

## 2013-04-12 ENCOUNTER — Ambulatory Visit
Admission: RE | Admit: 2013-04-12 | Discharge: 2013-04-12 | Disposition: A | Discharge status: Home / Self Care | Payer: BC Managed Care – PPO | Source: Ambulatory Visit | Attending: Radiation Oncology | Admitting: Radiation Oncology

## 2013-04-12 ENCOUNTER — Encounter: Payer: Self-pay | Admitting: Radiation Oncology

## 2013-04-13 ENCOUNTER — Ambulatory Visit
Admission: RE | Admit: 2013-04-13 | Discharge: 2013-04-13 | Disposition: A | Discharge status: Home / Self Care | Payer: BC Managed Care – PPO | Source: Ambulatory Visit | Attending: Radiation Oncology | Admitting: Radiation Oncology

## 2013-04-14 ENCOUNTER — Ambulatory Visit
Admission: RE | Admit: 2013-04-14 | Discharge: 2013-04-14 | Disposition: A | Discharge status: Home / Self Care | Payer: BC Managed Care – PPO | Source: Ambulatory Visit | Attending: Radiation Oncology | Admitting: Radiation Oncology

## 2013-04-17 ENCOUNTER — Ambulatory Visit: Payer: BC Managed Care – PPO

## 2013-04-18 ENCOUNTER — Ambulatory Visit
Admission: RE | Admit: 2013-04-18 | Discharge: 2013-04-18 | Disposition: A | Discharge status: Home / Self Care | Payer: BC Managed Care – PPO | Source: Ambulatory Visit | Attending: Radiation Oncology | Admitting: Radiation Oncology

## 2013-04-19 ENCOUNTER — Ambulatory Visit
Admission: RE | Admit: 2013-04-19 | Discharge: 2013-04-19 | Disposition: A | Discharge status: Home / Self Care | Payer: BC Managed Care – PPO | Source: Ambulatory Visit | Attending: Radiation Oncology | Admitting: Radiation Oncology

## 2013-04-19 ENCOUNTER — Ambulatory Visit: Payer: BC Managed Care – PPO

## 2013-04-19 VITALS — BP 115/77 | HR 79 | Temp 98.2°F | Ht 67.0 in | Wt 193.5 lb

## 2013-04-19 DIAGNOSIS — C50112 Malignant neoplasm of central portion of left female breast: Secondary | ICD-10-CM

## 2013-04-19 NOTE — Progress Notes (Signed)
.    Tracy Perez has received 32 fractions to her left breast.  She c/o soreness in left axilla and the incisional area.  Note hyperpigmentation of the left breast with dry desquamation in the left axilla and the left inframmary fold.  She also reports that she is fatigued, but is continuing to work full-time.

## 2013-04-19 NOTE — Progress Notes (Signed)
   Weekly Management Note:  outpatient Current Dose:  58.4 Gy  Projected Dose: 60.4 Gy   Narrative:  The patient presents for routine under treatment assessment.  CBCT/MVCT images/Port film x-rays were reviewed.  The chart was checked. She is increased skin irritation. Still able to work full-time.  Physical Findings:  height is 5\' 7"  (1.702 m) and weight is 193 lb 8 oz (87.771 kg). Her temperature is 98.2 F (36.8 C). Her blood pressure is 115/77 and her pulse is 79.  marketed hyperpigmentation throughout the left breast and axilla. Dry desquamation in the axilla and inframammary fold  Impression:  The patient is tolerating radiotherapy.  Plan:  Continue radiotherapy as planned. Continue radiaplex and Neosporin. F/u in 1 mo, sooner if needed  ________________________________   Lonie Peak, M.D.

## 2013-04-20 ENCOUNTER — Ambulatory Visit
Admission: RE | Admit: 2013-04-20 | Discharge: 2013-04-20 | Disposition: A | Discharge status: Home / Self Care | Payer: BC Managed Care – PPO | Source: Ambulatory Visit | Attending: Radiation Oncology | Admitting: Radiation Oncology

## 2013-04-20 ENCOUNTER — Encounter: Payer: Self-pay | Admitting: Radiation Oncology

## 2013-04-20 DIAGNOSIS — C50112 Malignant neoplasm of central portion of left female breast: Secondary | ICD-10-CM

## 2013-04-20 MED ORDER — RADIAPLEXRX EX GEL
Freq: Once | CUTANEOUS | Status: AC
  Administered 2013-04-20: 17:00:00 via TOPICAL

## 2013-04-24 NOTE — Progress Notes (Signed)
  Radiation Oncology         (336) [phone] ________________________________  Name: Tracy Perez MRN: [id number]  Date: 04/20/2013  DOB: 19-Dec-1965  End of Treatment Note  Diagnosis:   Left breast DCIS; ER/ PR+  Indication for treatment:  curative   Radiation treatment dates:   03/06/2013-04/20/2013  Site/dose:    1) Left Breast  /50.4Gy in 28 fractions 2) Left Breast Boost / 10 Gy in 5 fractions  Beams/energy:    1) Opposed Tangents, breathhold /  photons 2) Electron Boost / 12 MeV electrons  Narrative: The patient tolerated radiation treatment relatively well.   She developed marked hyperpigmentation over her breast with dry desquamation concentrated in the axilla and inframammary fold.  Plan: The patient has completed radiation treatment. The patient will return to radiation oncology clinic for routine followup in one month. I advised them to call or return sooner if they have any questions or concerns related to their recovery or treatment.  -----------------------------------  Lonie Peak, MD

## 2013-04-29 DIAGNOSIS — F4001 Agoraphobia with panic disorder: Secondary | ICD-10-CM | POA: Insufficient documentation

## 2013-04-29 DIAGNOSIS — F909 Attention-deficit hyperactivity disorder, unspecified type: Secondary | ICD-10-CM | POA: Insufficient documentation

## 2013-04-29 DIAGNOSIS — K219 Gastro-esophageal reflux disease without esophagitis: Secondary | ICD-10-CM | POA: Insufficient documentation

## 2013-05-03 NOTE — Progress Notes (Signed)
   04/12/13 Weekly Management Note:  outpatient Current Dose:  50.4 Gy  Projected Dose: 60.4 Gy   Narrative:  The patient presents for routine under treatment assessment.  CBCT/MVCT images/Port film x-rays were reviewed.  The chart was checked. Doing well.  Physical Findings:  vitals were not taken for this visit.  Diffuse hyperpigmentation throughout left breast with impending moist desquamation in a small patch in inframammary fold.  Impression:  The patient is tolerating radiotherapy.  Plan:  Continue radiotherapy as planned. Apply nepsporin to patch of impending moist desquamation - otherwise use Radiaplex diffusely.  ________________________________   Lonie Peak, M.D.

## 2013-05-03 NOTE — Progress Notes (Signed)
Electron Boost Treatment Planning Note / Complex simulation Note  Outpatient  Diagnosis: Breast DCIS, left  The patient's CT images from her free-breathing simulation were reviewed to plan her boost treatment to her left breast  lumpectomy cavity.  The boost to the lumpectomy cavity will be delivered with an en face electron field using 12 MeV energy.  Custom electron cut out will be used.  Special port plan approved.  10 Gy/5 fractions prescribed to 93% isodose line.  -----------------------------------  Lonie Peak, MD

## 2013-05-15 ENCOUNTER — Telehealth: Payer: Self-pay | Admitting: Oncology

## 2013-05-15 ENCOUNTER — Encounter: Payer: Self-pay | Admitting: Oncology

## 2013-05-15 ENCOUNTER — Ambulatory Visit (HOSPITAL_BASED_OUTPATIENT_CLINIC_OR_DEPARTMENT_OTHER): Payer: BC Managed Care – PPO | Admitting: Oncology

## 2013-05-15 VITALS — BP 138/87 | HR 70 | Temp 98.7°F | Resp 20 | Ht 67.0 in | Wt 194.6 lb

## 2013-05-15 DIAGNOSIS — C50919 Malignant neoplasm of unspecified site of unspecified female breast: Secondary | ICD-10-CM

## 2013-05-15 DIAGNOSIS — C50112 Malignant neoplasm of central portion of left female breast: Secondary | ICD-10-CM

## 2013-05-15 DIAGNOSIS — C50912 Malignant neoplasm of unspecified site of left female breast: Secondary | ICD-10-CM

## 2013-05-15 DIAGNOSIS — C50119 Malignant neoplasm of central portion of unspecified female breast: Secondary | ICD-10-CM

## 2013-05-15 MED ORDER — TAMOXIFEN CITRATE 20 MG PO TABS: 20.0000 mg | ORAL_TABLET | Freq: Every day | ORAL | Status: DC

## 2013-05-15 NOTE — Patient Instructions (Addendum)
Proceed with tamoxifen 20 mg daily as prevention for future breast cancer risk  I will see see you back in 3 months time for follow up   Tamoxifen oral tablet What is this medicine? TAMOXIFEN (ta MOX i fen) blocks the effects of estrogen. It is commonly used to treat breast cancer. It is also used to decrease the chance of breast cancer coming back in women who have received treatment for the disease. It may also help prevent breast cancer in women who have a high risk of developing breast cancer. This medicine may be used for other purposes; ask your health care provider or pharmacist if you have questions. What should I tell my health care provider before I take this medicine? They need to know if you have any of these conditions: -blood clots -blood disease -cataracts or impaired eyesight -endometriosis -high calcium levels -high cholesterol -irregular menstrual cycles -liver disease -stroke -uterine fibroids -an unusual or allergic reaction to tamoxifen, other medicines, foods, dyes, or preservatives -pregnant or trying to get pregnant -breast-feeding How should I use this medicine? Take this medicine by mouth with a glass of water. Follow the directions on the prescription label. You can take it with or without food. Take your medicine at regular intervals. Do not take your medicine more often than directed. Do not stop taking except on your doctor's advice. A special MedGuide will be given to you by the pharmacist with each prescription and refill. Be sure to read this information carefully each time. Talk to your pediatrician regarding the use of this medicine in children. While this drug may be prescribed for selected conditions, precautions do apply. Overdosage: If you think you have taken too much of this medicine contact a poison control center or emergency room at once. NOTE: This medicine is only for you. Do not share this medicine with others. What if I miss a dose? If  you miss a dose, take it as soon as you can. If it is almost time for your next dose, take only that dose. Do not take double or extra doses. What may interact with this medicine? -aminoglutethimide -bromocriptine -chemotherapy drugs -female hormones, like estrogens and birth control pills -letrozole -medroxyprogesterone -phenobarbital -rifampin -warfarin This list may not describe all possible interactions. Give your health care provider a list of all the medicines, herbs, non-prescription drugs, or dietary supplements you use. Also tell them if you smoke, drink alcohol, or use illegal drugs. Some items may interact with your medicine. What should I watch for while using this medicine? Visit your doctor or health care professional for regular checks on your progress. You will need regular pelvic exams, breast exams, and mammograms. If you are taking this medicine to reduce your risk of getting breast cancer, you should know that this medicine does not prevent all types of breast cancer. If breast cancer or other problems occur, there is no guarantee that it will be found at an early stage. Do not become pregnant while taking this medicine or for 2 months after stopping this medicine. Stop taking this medicine if you get pregnant or think you are pregnant and contact your doctor. This medicine may harm your unborn baby. Women who can possibly become pregnant should use birth control methods that do not use hormones during tamoxifen treatment and for 2 months after therapy has stopped. Talk with your health care provider for birth control advice. Do not breast feed while taking this medicine. What side effects may I notice from receiving  this medicine? Side effects that you should report to your doctor or health care professional as soon as possible: -changes in vision (blurred vision) -changes in your menstrual cycle -difficulty breathing or shortness of breath -difficulty walking or  talking -new breast lumps -numbness -pelvic pain or pressure -redness, blistering, peeling or loosening of the skin, including inside the mouth -skin rash or itching (hives) -sudden chest pain -swelling of lips, face, or tongue -swelling, pain or tenderness in your calf or leg -unusual bruising or bleeding -vaginal discharge that is bloody, brown, or rust -weakness -yellowing of the whites of the eyes or skin Side effects that usually do not require medical attention (report to your doctor or health care professional if they continue or are bothersome): -fatigue -hair loss, although uncommon and is usually mild -headache -hot flashes -impotence (in men) -nausea, vomiting (mild) -vaginal discharge (white or clear) This list may not describe all possible side effects. Call your doctor for medical advice about side effects. You may report side effects to FDA at 1-800-FDA-1088. Where should I keep my medicine? Keep out of the reach of children. Store at room temperature between 20 and 25 degrees C (68 and 77 degrees F). Protect from light. Keep container tightly closed. Throw away any unused medicine after the expiration date. NOTE: This sheet is a summary. It may not cover all possible information. If you have questions about this medicine, talk to your doctor, pharmacist, or health care provider.  2012, [employer]. (07/26/2008 12:01:56 PM)

## 2013-05-15 NOTE — Progress Notes (Signed)
OFFICE PROGRESS NOTE  CC  Tracy Hansen, NP [address] [place] [address] Dr. Claud Kelp  Dr. 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   STAGE:  Left central portion of breast  DCIS, ER+/PR+,  TisNxMx  S/P lumpectomy  PRIOR THERAPY: #1patient underwent a screening mammogram that showed microcalcifications in the left breast in the retroareolar area.  #2 patient had a left excisional biopsy with needle localization on general 24 2014.  #3 the final pathology revealed ductal carcinoma in situ with necrosis closest margin was anterior less than 2 mm but negative. Tumor was ER +100% PR +85%.  #4 MRI of the breast showed a solitary finding with biopsy effect in this subareolar region in the left breast  #5 patient saw me back in 02/13/2013. At that time she was recommended to proceed with adjuvant radiation therapy. She has now completed this from 03/07/2013 through 04/20/2013.overall she tolerated the treatment very nicely. This was given by Dr. Lonie Peak.  #6 patient will proceed with adjuvant antiestrogen therapy. Because patient is premenopausal we discussed using tamoxifen 20 mg daily. Risks and benefits of this were discussed with her. She will receive tamoxifen 20 mg for a total of 5 years.  CURRENT THERAPY:begin tamoxifen 20 mg daily  INTERVAL HISTORY: Tracy Perez 47 y.o. female returns for followup visit. She has now completed her radiation therapy.  She is denying any nausea vomiting fevers chills night sweats headaches no shortness of breath no chest pains palpitations. She does have some fatigue. Patient also has significant amount of anxiety. She tells me that throughout her radiation she did continue to work. But she now finds that she is very tired. She is exhausted. She is very emotional. She will begin tamoxifen and I am concerned about her  overall tolerance to it since patient has  had significantly difficult course with radiation. I do think that she may benefit from taking some time off of work. We did discuss this today.  MEDICAL HISTORY: Past Medical History  Diagnosis Date  . Anxiety   . Hypertension   . Breast cancer 01/16/13     Left Breast - Central Portion/ Ductal Carcinoma In-situ with Necrosis, Macrocalcifications identified / ER 100%, PR 85%    ALLERGIES:  is allergic to gadolinium derivatives.  MEDICATIONS:  Current Outpatient Prescriptions  Medication Sig Dispense Refill  . ALPRAZolam (XANAX) 0.5 MG tablet Take 0.5 mg by mouth as needed.      . chlorthalidone (HYGROTON) 25 MG tablet Take 25 mg by mouth daily.      . cholecalciferol (VITAMIN D) 1000 UNITS tablet Take 1,000 Units by mouth daily.      . ferrous fumarate (HEMOCYTE - 106 MG FE) 325 (106 FE) MG TABS Take 1 tablet by mouth daily.      . hyaluronate sodium (RADIAPLEXRX) GEL Apply topically 2 (two) times daily. Apply to affected skin area after rad tx daily and bedtime,including Saturday and Sunday, just not 4 hours prior to rad tx      . HYDROcodone-acetaminophen (NORCO/VICODIN) 5-325 MG per tablet Take 1-2 tablets by mouth every 4 (four) hours as needed for pain.  30 tablet  1  . non-metallic deodorant (ALRA) MISC Apply 1 application topically daily.       No current facility-administered medications for this visit.    SURGICAL HISTORY:  Past Surgical History  Procedure Laterality Date  .  Cesarean section       x 2  . Appendectomy    . Leg surgery Left 1994    vascular  . Partial mastectomy with needle localization Left 01/16/2013    Procedure: PARTIAL MASTECTOMY WITH NEEDLE LOCALIZATION;  Surgeon: [name], MD;  Location: [facility];  Service: General;  Laterality: Left;  Left partial mastectomy with needle localization at breast center of [place]     REVIEW OF SYSTEMS:  Pertinent items are noted in HPI.   HEALTH  MAINTENANCE:     PHYSICAL EXAMINATION: Blood pressure 138/87, pulse 70, temperature 98.7 F (37.1 C), temperature source Oral, resp. rate 20, height 5\' 7"  (1.702 m), weight 194 lb 9.6 oz (88.27 kg). Body mass index is 30.47 kg/(m^2). ECOG PERFORMANCE STATUS: 1 - Symptomatic but completely ambulatory   General appearance: alert, cooperative and appears stated age Resp: clear to auscultation bilaterally Cardio: regular rate and rhythm GI: soft, non-tender; bowel sounds normal; no masses,  no organomegaly Extremities: extremities normal, atraumatic, no cyanosis or edema Neurologic: Grossly normal Breast examination: leftt breast well healing surgical scar no nodularity. right breast no masses or nipple discharge  LABORATORY DATA: Lab Results  Component Value Date   WBC 5.6 02/13/2013   HGB 12.4 02/13/2013   HCT 37.3 02/13/2013   MCV 87.6 02/13/2013   PLT 273 02/13/2013      Chemistry      Component Value Date/Time   NA 141 02/13/2013 1303   NA 140 01/12/2013 1430   K 3.6 02/13/2013 1303   K 4.1 01/12/2013 1430   CL 106 02/13/2013 1303   CL 101 01/12/2013 1430   CO2 26 02/13/2013 1303   CO2 29 01/12/2013 1430   BUN 6.4* 02/13/2013 1303   BUN 9 01/12/2013 1430   CREATININE 0.7 02/13/2013 1303   CREATININE 0.75 01/12/2013 1430      Component Value Date/Time   CALCIUM 9.3 02/13/2013 1303   CALCIUM 10.0 01/12/2013 1430   ALKPHOS 53 02/13/2013 1303   AST 16 02/13/2013 1303   ALT 12 02/13/2013 1303   BILITOT 0.77 02/13/2013 1303     ADDITIONAL INFORMATION: PROGNOSTIC INDICATORS - ACIS Results IMMUNOHISTOCHEMICAL AND MORPHOMETRIC ANALYSIS BY THE AUTOMATED CELLULAR IMAGING SYSTEM (ACIS) Estrogen Receptor (Negative, <1%): 100%, POSITIVE, STRONG STAINING INTENSITY Progesterone Receptor (Negative, <1%): 85%, POSITIVE, STRONG STAINING INTENSITY All controls stained appropriately Pecola Leisure MD Pathologist, Electronic Signature ( Signed 01/25/2013) FINAL DIAGNOSIS Diagnosis Breast,  lumpectomy, Left central - DUCTAL CARCINOMA IN SITU WITH NECROSIS, SEE COMMENT. - IN SITU CARCINOMA IS LESS THAN 2 MM FROM NEAREST MARGIN (ANTERIOR). - MICROCALCIFICATIONS IDENTIFIED. - SEE TEMPLATE BELOW. Microscopic Comment BREAST, IN SITU CARCINOMA Specimen, including laterality: Left breast Procedure: Lumpectomy Grade of carcinoma: II-III of III Necrosis: Present 1 of 3 Duplicate copy FINAL for Tracy Perez, Tracy Perez [phone]) Microscopic Comment(continued) Estimated tumor size: (glass slide measurement): up to 4 mm (multiple foci) Treatment effect: None If present, treatment effect in breast tissue, lymph nodes or both: N/A Distance to closest margin: 2 mm If margin positive, focally or broadly: N/A Breast prognostic profile: Pending and will be reported in an addendum Estrogen receptor: Progesterone receptor: Lymph nodes: Examined: 0 Lymph nodes with metastasis: N/A TNM: pTis, pNX Comments: There are multiple foci of in situ carcinoma identified that span 4 mm in greatest dimension. Slide 1C was reviewed by Dr. Raynald Blend who concurs with the presence of insitu carcinoma. The case ws discussed with Dr. Derrell Lolling on 01/19/2013 (CRR:caf 01/18/13) [place] RUND  DO Pathologist, Electronic Signature (Case signed 01/19/2013) S  RADIOGRAPHIC STUDIES:  No results found.  ASSESSMENT: 47 year old female with  #1 diagnosis of ductal carcinoma in situ( DCIS stage 0) patient is status post lumpectomy. Her tumor was ER positive PR positive. She went on after surgery to receive radiation therapy she has now completed this in May 2014. Most significant complication has been extreme exhaustion fatigue.  #2 she will proceed with tamoxifen 20 mg daily. We discussed risks and benefits of this. We also discussed length of therapy. I am concerned about her overall health of whether she would be able to tolerate this or not. We discussed extensively whether she should take a period of time off to see  how she does with her treatment. She will try to get me her paperwork for short-term disability. We will get these done.   PLAN:   #1 proceed with tamoxifen 20 mg daily.  #2 patient should consider taking short-term disability to recuperate from her therapies.  #3 patient will be seen back in 3 months time for followup   All questions were answered. The patient knows to call the clinic with any problems, questions or concerns. We can certainly see the patient much sooner if necessary.  I spent 25 minutes counseling the patient face to face. The total time spent in the appointment was 30 minutes.    Tracy Second, MD Medical/Oncology [facility] [phone] (beeper) [phone] (Office)  05/15/2013, 12:47 PM

## 2013-05-19 ENCOUNTER — Encounter: Payer: Self-pay | Admitting: Oncology

## 2013-05-19 NOTE — Progress Notes (Signed)
Put disability form on nurse's desk. °

## 2013-05-25 DIAGNOSIS — E559 Vitamin D deficiency, unspecified: Secondary | ICD-10-CM | POA: Insufficient documentation

## 2013-05-31 ENCOUNTER — Encounter: Payer: Self-pay | Admitting: Radiation Oncology

## 2013-06-02 ENCOUNTER — Ambulatory Visit
Admission: RE | Admit: 2013-06-02 | Discharge: 2013-06-02 | Disposition: A | Discharge status: Home / Self Care | Payer: BC Managed Care – PPO | Source: Ambulatory Visit | Attending: Radiation Oncology | Admitting: Radiation Oncology

## 2013-06-02 ENCOUNTER — Encounter: Payer: Self-pay | Admitting: Radiation Oncology

## 2013-06-02 VITALS — BP 126/92 | HR 85 | Temp 99.0°F | Ht 67.0 in | Wt 197.8 lb

## 2013-06-02 DIAGNOSIS — C50112 Malignant neoplasm of central portion of left female breast: Secondary | ICD-10-CM

## 2013-06-02 HISTORY — DX: Personal history of irradiation: Z92.3

## 2013-06-02 HISTORY — DX: Long term (current) use of selective estrogen receptor modulators (serms): Z79.810

## 2013-06-02 NOTE — Progress Notes (Signed)
Ms. Aman here for assessment following radiation to her left breast which completed in 04/20/13.  She is very concerned about weight gain since starting Tamoxifen and concerned about her increased appetite.  She has gained 3 lbs since her last vitals in epic on 05/15/13.   She is asking about diet suppressant.  She has seen the dietician for advice and she also was encouraged to exercise.  Skin on left breast is intact and mild hyperpigmentation and she admits to "some soreness in this area.

## 2013-06-02 NOTE — Progress Notes (Signed)
  Radiation Oncology         [phone] ________________________________  Name: SHATIMA ZALAR MRN: [id number]  Date: 06/02/2013  DOB: [birth year]  Follow-Up Visit Note  Outpatient  CC: Iona Hansen, NP  [name], MD  Diagnosis and Prior Radiotherapy:   Left breast DCIS; ER/ PR+   Indication for treatment: curative   Radiation treatment dates: 03/06/2013-04/20/2013   Site/dose:  1) Left Breast /50.4Gy in 28 fractions  2) Left Breast Boost / 10 Gy in 5 fractions     Narrative:  The patient returns today for routine follow-up.  Has some residual soreness in the left breast.  Skin healing well. Weight gain associated with start of Tamoxifen.                          ALLERGIES:  is allergic to gadolinium derivatives.  Meds: Current Outpatient Prescriptions  Medication Sig Dispense Refill  . ALPRAZolam (XANAX) 0.5 MG tablet Take 0.5 mg by mouth as needed.      . chlorthalidone (HYGROTON) 25 MG tablet Take 25 mg by mouth daily.      . cholecalciferol (VITAMIN D) 1000 UNITS tablet Take 1,000 Units by mouth daily.      . ferrous fumarate (HEMOCYTE - 106 MG FE) 325 (106 FE) MG TABS Take 1 tablet by mouth daily.      . hyaluronate sodium (RADIAPLEXRX) GEL Apply 1 application topically once.      . tamoxifen (NOLVADEX) 20 MG tablet Take 1 tablet (20 mg total) by mouth daily.  90 tablet  6   No current facility-administered medications for this encounter.    Physical Findings: The patient is in no acute distress. Patient is alert and oriented.  height is 5\' 7"  (1.702 m) and weight is 197 lb 12.8 oz (89.721 kg). Her temperature is 99 F (37.2 C). Her blood pressure is 126/92 and her pulse is 85. . Mild residual hyperpigmentation over left breast/ axilla  Lab Findings: Lab Results  Component Value Date   WBC 5.6 02/13/2013   HGB 12.4 02/13/2013   HCT 37.3 02/13/2013   MCV 87.6 02/13/2013   PLT 273 02/13/2013    Radiographic Findings: No results  found.  Impression/Plan:  Doing well. Healing well from RT to left breast.  Concern re: weight gain. Has been told to exercise more. I advised her to keep a food journal and call Vernell Leep for nutrition advice.   I will see her back on an as-needed basis. I have encouraged her to call if she has any issues or concerns in the future. I wished her the very best.  I spent 15 minutes minutes face to face with the patient and more than 50% of that time was spent in counseling and/or coordination of care. _____________________________________   Lonie Peak, MD

## 2013-06-07 ENCOUNTER — Encounter: Payer: Self-pay | Admitting: Oncology

## 2013-06-07 NOTE — Progress Notes (Signed)
Faxed disability form to [phone]; put originals in registration desk °

## 2013-07-03 ENCOUNTER — Telehealth: Payer: Self-pay | Admitting: Radiation Oncology

## 2013-07-03 NOTE — Telephone Encounter (Signed)
Returned message left by patient. Patient treated by Dr. Basilio Cairo May 2014 for left breast ca. Patient reports that she had an EKG recently that showed an abnormality. Patient questioned if the weekly xrays that were done while she was under radiation therapy "showed anything." Explained to the patient those xrays are not diagnostic and simply show if things are lining up appropriately. Assured the patient if any abnormalities were seen by our staff on diagnostic scans she would be the first to know. She expressed understanding and appreciation for the call back.

## 2013-07-05 ENCOUNTER — Encounter: Payer: Self-pay | Admitting: Oncology

## 2013-07-05 NOTE — Progress Notes (Signed)
Ms. Wann asked that I fax office notes from June-present to [facility] @ [phone].

## 2013-08-02 ENCOUNTER — Encounter: Payer: Self-pay | Admitting: Oncology

## 2013-08-02 NOTE — Progress Notes (Signed)
Put disability form on nurse's desk. °

## 2013-08-04 ENCOUNTER — Encounter: Payer: Self-pay | Admitting: Oncology

## 2013-08-04 NOTE — Progress Notes (Signed)
Faxed disability form to [place] @ [phone].

## 2013-08-14 ENCOUNTER — Ambulatory Visit (HOSPITAL_BASED_OUTPATIENT_CLINIC_OR_DEPARTMENT_OTHER): Payer: BC Managed Care – PPO | Admitting: Oncology

## 2013-08-14 ENCOUNTER — Telehealth: Payer: Self-pay | Admitting: Medical Oncology

## 2013-08-14 ENCOUNTER — Other Ambulatory Visit (HOSPITAL_BASED_OUTPATIENT_CLINIC_OR_DEPARTMENT_OTHER): Payer: BC Managed Care – PPO | Admitting: Lab

## 2013-08-14 ENCOUNTER — Encounter: Payer: Self-pay | Admitting: Oncology

## 2013-08-14 VITALS — BP 139/90 | HR 72 | Temp 98.4°F | Resp 20 | Ht 67.0 in | Wt 193.7 lb

## 2013-08-14 DIAGNOSIS — C50112 Malignant neoplasm of central portion of left female breast: Secondary | ICD-10-CM

## 2013-08-14 DIAGNOSIS — D0592 Unspecified type of carcinoma in situ of left breast: Secondary | ICD-10-CM

## 2013-08-14 DIAGNOSIS — D0590 Unspecified type of carcinoma in situ of unspecified breast: Secondary | ICD-10-CM

## 2013-08-14 DIAGNOSIS — C50119 Malignant neoplasm of central portion of unspecified female breast: Secondary | ICD-10-CM

## 2013-08-14 LAB — CBC WITH DIFFERENTIAL/PLATELET
Eosinophils Absolute: 0.4 10*3/uL (ref 0.0–0.5)
HCT: 40.8 % (ref 34.8–46.6)
LYMPH%: 27.1 % (ref 14.0–49.7)
MCHC: 33.6 g/dL (ref 31.5–36.0)
MCV: 87.4 fL (ref 79.5–101.0)
MONO#: 0.6 10*3/uL (ref 0.1–0.9)
MONO%: 10.3 % (ref 0.0–14.0)
NEUT#: 3.4 10*3/uL (ref 1.5–6.5)
NEUT%: 56.1 % (ref 38.4–76.8)
Platelets: 241 10*3/uL (ref 145–400)
RBC: 4.67 10*6/uL (ref 3.70–5.45)
WBC: 6.1 10*3/uL (ref 3.9–10.3)

## 2013-08-14 LAB — COMPREHENSIVE METABOLIC PANEL (CC13)
Alkaline Phosphatase: 57 U/L (ref 40–150)
BUN: 9 mg/dL (ref 7.0–26.0)
CO2: 28 mEq/L (ref 22–29)
Creatinine: 0.8 mg/dL (ref 0.6–1.1)
Glucose: 93 mg/dl (ref 70–140)
Sodium: 143 mEq/L (ref 136–145)
Total Bilirubin: 0.42 mg/dL (ref 0.20–1.20)

## 2013-08-14 MED ORDER — TAMOXIFEN CITRATE 20 MG PO TABS: 20.0000 mg | ORAL_TABLET | Freq: Every day | ORAL | Status: DC

## 2013-08-14 NOTE — Telephone Encounter (Signed)
Per NP, informed patient potassium resulted slightly low, encouraged patient to add potassium enriched foods into her diet and gave patient a list of foods high in potassium. Patient gave verbal understanding no further questions at this time, knows to call office with any questions or concerns.

## 2013-08-14 NOTE — Telephone Encounter (Signed)
Message copied by Rexene Edison on Mon Aug 14, 2013  2:29 PM ------      Message from: Laural Golden      Created: Mon Aug 14, 2013 11:01 AM       Please instruct patient in potassium rich diet.  Her level is slightly low today.             Thanks,       L      ----- Message -----         From: [facility] Interface         Sent: 08/14/2013   9:40 AM           To: Victorino December, MD                   ------

## 2013-08-14 NOTE — Progress Notes (Signed)
OFFICE PROGRESS NOTE  CC  Tracy Hansen, NP [address] [place] [address] Dr. Claud Kelp  Dr. 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   STAGE:  Left central portion of breast  DCIS, ER+/PR+,  TisNxMx  S/P lumpectomy  PRIOR THERAPY: #1patient underwent a screening mammogram that showed microcalcifications in the left breast in the retroareolar area.  #2 patient had a left excisional biopsy with needle localization on general 24 2014.  #3 the final pathology revealed ductal carcinoma in situ with necrosis closest margin was anterior less than 2 mm but negative. Tumor was ER +100% PR +85%.  #4 MRI of the breast showed a solitary finding with biopsy effect in this subareolar region in the left breast  #5 patient saw me back in 02/13/2013. At that time she was recommended to proceed with adjuvant radiation therapy. She has now completed this from 03/07/2013 through 04/20/2013.overall she tolerated the treatment very nicely. This was given by Dr. Lonie Peak.  #6 patient will proceed with adjuvant antiestrogen therapy. Because patient is premenopausal we discussed using tamoxifen 20 mg daily. Risks and benefits of this were discussed with her. She will receive tamoxifen 20 mg for a total of 5 years.  CURRENT THERAPY: tamoxifen 20 mg daily  INTERVAL HISTORY: MCKAILA Perez 47 y.o. female returns for followup visit. .  She is denying any nausea vomiting fevers chills night sweats headaches no shortness of breath no chest pains palpitations. She does have some fatigue. Patient also has significant amount of anxiety. She has been off of work and this has helped her. She is planning on going back to work next week. She is tolerating tamoxifen without any significant problems. Her physical exam looks good. She has no evidence of recurrent disease remainder of the 10 point  review of systems is negative.  MEDICAL HISTORY: Past Medical History  Diagnosis Date  . Anxiety   . Hypertension   . Breast cancer 01/16/13     Left Breast - Central Portion/ Ductal Carcinoma In-situ with Necrosis, Macrocalcifications identified / ER 100%, PR 85%  . S/P radiation therapy 03/06/2013-04/20/2013    Left Breast  /50.4Gy in 28 fractions/ Left Breast Boost / 10 Gy in 5 fractions  . Use of tamoxifen (Nolvadex)     ALLERGIES:  is allergic to gadolinium derivatives.  MEDICATIONS:  Current Outpatient Prescriptions  Medication Sig Dispense Refill  . ALPRAZolam (XANAX) 0.5 MG tablet Take 0.5 mg by mouth as needed.      . chlorthalidone (HYGROTON) 25 MG tablet Take 25 mg by mouth daily.      . cholecalciferol (VITAMIN D) 1000 UNITS tablet Take 1,000 Units by mouth daily.      . ferrous fumarate (HEMOCYTE - 106 MG FE) 325 (106 FE) MG TABS Take 1 tablet by mouth daily.      . hyaluronate sodium (RADIAPLEXRX) GEL Apply 1 application topically once.      . tamoxifen (NOLVADEX) 20 MG tablet Take 1 tablet (20 mg total) by mouth daily.  90 tablet  6   No current facility-administered medications for this visit.    SURGICAL HISTORY:  Past Surgical History  Procedure Laterality Date  . Cesarean section       x 2  . Appendectomy    . Leg surgery Left 1994    vascular  . Partial mastectomy with needle localization Left 01/16/2013  Procedure: PARTIAL MASTECTOMY WITH NEEDLE LOCALIZATION;  Surgeon: [name], MD;  Location: [facility];  Service: General;  Laterality: Left;  Left partial mastectomy with needle localization at breast center of [place]     REVIEW OF SYSTEMS:  Pertinent items are noted in HPI.        PHYSICAL EXAMINATION: Blood pressure 139/90, pulse 72, temperature 98.4 F (36.9 C), temperature source Oral, resp. rate 20, height 5\' 7"  (1.702 m), weight 193 lb 11.2 oz (87.862 kg). Body mass index is 30.33 kg/(m^2). ECOG PERFORMANCE STATUS: 1 -  Symptomatic but completely ambulatory   General appearance: alert, cooperative and appears stated age Resp: clear to auscultation bilaterally Cardio: regular rate and rhythm GI: soft, non-tender; bowel sounds normal; no masses,  no organomegaly Extremities: extremities normal, atraumatic, no cyanosis or edema Neurologic: Grossly normal Breast examination: leftt breast well healing surgical scar no nodularity. right breast no masses or nipple discharge  LABORATORY DATA: Lab Results  Component Value Date   WBC 6.1 08/14/2013   HGB 13.7 08/14/2013   HCT 40.8 08/14/2013   MCV 87.4 08/14/2013   PLT 241 08/14/2013      Chemistry      Component Value Date/Time   NA 143 08/14/2013 0931   NA 140 01/12/2013 1430   K 3.4* 08/14/2013 0931   K 4.1 01/12/2013 1430   CL 106 02/13/2013 1303   CL 101 01/12/2013 1430   CO2 28 08/14/2013 0931   CO2 29 01/12/2013 1430   BUN 9.0 08/14/2013 0931   BUN 9 01/12/2013 1430   CREATININE 0.8 08/14/2013 0931   CREATININE 0.75 01/12/2013 1430      Component Value Date/Time   CALCIUM 9.9 08/14/2013 0931   CALCIUM 10.0 01/12/2013 1430   ALKPHOS 57 08/14/2013 0931   AST 28 08/14/2013 0931   ALT 22 08/14/2013 0931   BILITOT 0.42 08/14/2013 0931     ADDITIONAL INFORMATION: PROGNOSTIC INDICATORS - ACIS Results IMMUNOHISTOCHEMICAL AND MORPHOMETRIC ANALYSIS BY THE AUTOMATED CELLULAR IMAGING SYSTEM (ACIS) Estrogen Receptor (Negative, <1%): 100%, POSITIVE, STRONG STAINING INTENSITY Progesterone Receptor (Negative, <1%): 85%, POSITIVE, STRONG STAINING INTENSITY All controls stained appropriately Pecola Leisure MD Pathologist, Electronic Signature ( Signed 01/25/2013) FINAL DIAGNOSIS Diagnosis Breast, lumpectomy, Left central - DUCTAL CARCINOMA IN SITU WITH NECROSIS, SEE COMMENT. - IN SITU CARCINOMA IS LESS THAN 2 MM FROM NEAREST MARGIN (ANTERIOR). - MICROCALCIFICATIONS IDENTIFIED. - SEE TEMPLATE BELOW. Microscopic Comment BREAST, IN SITU CARCINOMA Specimen, including  laterality: Left breast Procedure: Lumpectomy Grade of carcinoma: II-III of III Necrosis: Present 1 of 3 Duplicate copy FINAL for KAIDANCE, PANTOJA [phone]) Microscopic Comment(continued) Estimated tumor size: (glass slide measurement): up to 4 mm (multiple foci) Treatment effect: None If present, treatment effect in breast tissue, lymph nodes or both: N/A Distance to closest margin: 2 mm If margin positive, focally or broadly: N/A Breast prognostic profile: Pending and will be reported in an addendum Estrogen receptor: Progesterone receptor: Lymph nodes: Examined: 0 Lymph nodes with metastasis: N/A TNM: pTis, pNX Comments: There are multiple foci of in situ carcinoma identified that span 4 mm in greatest dimension. Slide 1C was reviewed by Dr. Raynald Blend who concurs with the presence of insitu carcinoma. The case ws discussed with Dr. Derrell Lolling on 01/19/2013 (CRR:caf 01/18/13) [place] RUND DO Pathologist, Electronic Signature (Case signed 01/19/2013) S  RADIOGRAPHIC STUDIES:  No results found.  ASSESSMENT: 47 year old female with  #1 diagnosis of ductal carcinoma in situ( DCIS stage 0) patient is status post lumpectomy. Her tumor  was ER positive PR positive. She went on after surgery to receive radiation therapy she has now completed this in May 2014. Most significant complication has been extreme exhaustion fatigue.  #2 she will proceed with tamoxifen 20 mg daily. We discussed risks and benefits of this. We also discussed length of therapy. I am concerned about her overall health of whether she would be able to tolerate this or not. We discussed extensively whether she should take a period of time off to see how she does with her treatment. She will try to get me her paperwork for short-term disability. We will get these done.  #3 anxiety  #4 pre-menopausal  #5 Hypokalemia  PLAN:   #1 continue tamoxifen with tamoxifen 20 mg daily. Refills given  #2 anxiety manage   Xanax  #3 Patient will return to work  #4 hypokalemia I have recommended she increase potassium intake through foods.  #6 patient will be seen back in 6 months time for followup   All questions were answered. The patient knows to call the clinic with any problems, questions or concerns. We can certainly see the patient much sooner if necessary.The length of time of the face-to-face encounter was 25    minutes. More than 50% of time was spent counseling and coordination of care.       Drue Second, MD Medical/Oncology [facility] [phone] (beeper) [phone] (Office)  08/14/2013, 10:22 AM

## 2013-08-17 ENCOUNTER — Telehealth: Payer: Self-pay | Admitting: Oncology

## 2013-08-17 NOTE — Telephone Encounter (Signed)
, °

## 2013-08-18 ENCOUNTER — Telehealth: Payer: Self-pay | Admitting: Medical Oncology

## 2013-08-18 NOTE — Telephone Encounter (Signed)
Patient LVMOM requesting a return to work letter for Monday 08/21/13.   Mssg forwarded to MD/NP for review.  LOV 08/21/13  Next sched appt. 02/05/2014

## 2013-08-21 ENCOUNTER — Other Ambulatory Visit: Payer: Self-pay | Admitting: Emergency Medicine

## 2013-08-21 ENCOUNTER — Telehealth: Payer: Self-pay | Admitting: Emergency Medicine

## 2013-08-21 NOTE — Telephone Encounter (Signed)
Letter written for patient to return to work; signed per Dr Welton Flakes and give to Ms Marylouise Stacks at the front desk of the cancer center. Spoke with patient and informed her that she can pick up the letter at her convenience. Patient verbalized understanding.

## 2013-08-21 NOTE — Telephone Encounter (Signed)
Please take care of this.  

## 2014-01-03 ENCOUNTER — Other Ambulatory Visit: Payer: Self-pay | Admitting: Family Medicine

## 2014-01-03 DIAGNOSIS — Z9889 Other specified postprocedural states: Secondary | ICD-10-CM

## 2014-01-03 DIAGNOSIS — Z853 Personal history of malignant neoplasm of breast: Secondary | ICD-10-CM

## 2014-01-16 ENCOUNTER — Ambulatory Visit
Admission: RE | Admit: 2014-01-16 | Discharge: 2014-01-16 | Disposition: A | Discharge status: Home / Self Care | Payer: BC Managed Care – PPO | Source: Ambulatory Visit | Attending: Family Medicine | Admitting: Family Medicine

## 2014-01-16 DIAGNOSIS — Z9889 Other specified postprocedural states: Secondary | ICD-10-CM

## 2014-01-16 DIAGNOSIS — Z853 Personal history of malignant neoplasm of breast: Secondary | ICD-10-CM

## 2014-01-22 ENCOUNTER — Institutional Professional Consult (permissible substitution): Payer: BC Managed Care – PPO | Admitting: Internal Medicine

## 2014-02-02 ENCOUNTER — Other Ambulatory Visit: Payer: Self-pay

## 2014-02-02 DIAGNOSIS — C50119 Malignant neoplasm of central portion of unspecified female breast: Secondary | ICD-10-CM

## 2014-02-05 ENCOUNTER — Other Ambulatory Visit (HOSPITAL_BASED_OUTPATIENT_CLINIC_OR_DEPARTMENT_OTHER): Payer: BC Managed Care – PPO

## 2014-02-05 ENCOUNTER — Telehealth: Payer: Self-pay | Admitting: *Deleted

## 2014-02-05 ENCOUNTER — Ambulatory Visit (HOSPITAL_BASED_OUTPATIENT_CLINIC_OR_DEPARTMENT_OTHER): Payer: BC Managed Care – PPO | Admitting: Oncology

## 2014-02-05 ENCOUNTER — Encounter: Payer: Self-pay | Admitting: Oncology

## 2014-02-05 VITALS — BP 145/84 | HR 66 | Temp 99.1°F | Resp 18 | Ht 67.0 in | Wt 196.2 lb

## 2014-02-05 DIAGNOSIS — F411 Generalized anxiety disorder: Secondary | ICD-10-CM

## 2014-02-05 DIAGNOSIS — C50119 Malignant neoplasm of central portion of unspecified female breast: Secondary | ICD-10-CM

## 2014-02-05 DIAGNOSIS — D0590 Unspecified type of carcinoma in situ of unspecified breast: Secondary | ICD-10-CM

## 2014-02-05 DIAGNOSIS — Z170 Estrogen receptor positive status [ER+]: Secondary | ICD-10-CM

## 2014-02-05 LAB — COMPREHENSIVE METABOLIC PANEL (CC13)
ALT: 19 U/L (ref 0–55)
AST: 18 U/L (ref 5–34)
Albumin: 3.5 g/dL (ref 3.5–5.0)
Alkaline Phosphatase: 50 U/L (ref 40–150)
Anion Gap: 9 mEq/L (ref 3–11)
BUN: 8.7 mg/dL (ref 7.0–26.0)
CALCIUM: 9.6 mg/dL (ref 8.4–10.4)
CHLORIDE: 109 meq/L (ref 98–109)
CO2: 27 mEq/L (ref 22–29)
CREATININE: 0.8 mg/dL (ref 0.6–1.1)
Glucose: 92 mg/dl (ref 70–140)
Potassium: 3.5 mEq/L (ref 3.5–5.1)
Sodium: 144 mEq/L (ref 136–145)
Total Bilirubin: 0.46 mg/dL (ref 0.20–1.20)
Total Protein: 6.9 g/dL (ref 6.4–8.3)

## 2014-02-05 LAB — CBC WITH DIFFERENTIAL/PLATELET
BASO%: 0.4 % (ref 0.0–2.0)
Basophils Absolute: 0 10*3/uL (ref 0.0–0.1)
EOS%: 2.1 % (ref 0.0–7.0)
Eosinophils Absolute: 0.1 10*3/uL (ref 0.0–0.5)
HCT: 38.9 % (ref 34.8–46.6)
HGB: 12.8 g/dL (ref 11.6–15.9)
LYMPH#: 1.3 10*3/uL (ref 0.9–3.3)
LYMPH%: 21.8 % (ref 14.0–49.7)
MCH: 29.7 pg (ref 25.1–34.0)
MCHC: 33 g/dL (ref 31.5–36.0)
MCV: 90.1 fL (ref 79.5–101.0)
MONO#: 0.4 10*3/uL (ref 0.1–0.9)
MONO%: 6.6 % (ref 0.0–14.0)
NEUT#: 4.2 10*3/uL (ref 1.5–6.5)
NEUT%: 69.1 % (ref 38.4–76.8)
Platelets: 256 10*3/uL (ref 145–400)
RBC: 4.31 10*6/uL (ref 3.70–5.45)
RDW: 12.3 % (ref 11.2–14.5)
WBC: 6.1 10*3/uL (ref 3.9–10.3)

## 2014-02-05 NOTE — Telephone Encounter (Signed)
appts made and printed...td 

## 2014-02-05 NOTE — Patient Instructions (Signed)
Continue tamoxifen 20 mg daily  I will see you back in 1 year

## 2014-02-05 NOTE — Progress Notes (Signed)
OFFICE PROGRESS NOTE  CC  Berkley Harvey, NP [address] [place] [address] Dr. Fanny Skates  Dr. 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   STAGE:  Left central portion of breast  DCIS, ER+/PR+,  TisNxMx  S/P lumpectomy  PRIOR THERAPY: #1patient underwent a screening mammogram that showed microcalcifications in the left breast in the retroareolar area.  #2 patient had a left excisional biopsy with needle localization on general 24 2014.  #3 the final pathology revealed ductal carcinoma in situ with necrosis closest margin was anterior less than 2 mm but negative. Tumor was ER +100% PR +85%.  #4 MRI of the breast showed a solitary finding with biopsy effect in this subareolar region in the left breast  #5 status post radiation therapy administered from  03/07/2013 through 04/20/2013.overall she tolerated the treatment very nicely. This was given by Dr. Eppie Gibson.  #6  adjuvant antiestrogen therapy.tamoxifen 20 mg daily. Risks and benefits of this were discussed with her. She will receive tamoxifen 20 mg for a total of 5 years.  CURRENT THERAPY: tamoxifen 20 mg daily  INTERVAL HISTORY: Tracy Perez 48 y.o. female returns for followup visit. Marland Kitchen overall patient seems to be doing well. She's tolerating the tamoxifen well except for hot flashes. She tells me that they are tolerable. She is up-to-date on her mammograms. She denies any headaches double vision blurring of vision no fevers chills or night sweats no vaginal bleeding or discharge no lower extremity swelling. No cough hemoptysis or hematemesis. Remainder of the 10 point review of systems is negative  MEDICAL HISTORY: Past Medical History  Diagnosis Date  . Anxiety   . Hypertension   . Breast cancer 01/16/13     Left Breast - Central Portion/ Ductal Carcinoma In-situ with Necrosis, Macrocalcifications  identified / ER 100%, PR 85%  . S/P radiation therapy 03/06/2013-04/20/2013    Left Breast  /50.4Gy in 28 fractions/ Left Breast Boost / 10 Gy in 5 fractions  . Use of tamoxifen (Nolvadex)     ALLERGIES:  is allergic to gadolinium derivatives.  MEDICATIONS:  Current Outpatient Prescriptions  Medication Sig Dispense Refill  . ALPRAZolam (XANAX) 0.5 MG tablet Take 0.5 mg by mouth as needed.      . benzonatate (TESSALON) 200 MG capsule       . chlorthalidone (HYGROTON) 25 MG tablet Take 25 mg by mouth daily.      . cholecalciferol (VITAMIN D) 1000 UNITS tablet Take 1,000 Units by mouth daily.      . ferrous fumarate (HEMOCYTE - 106 MG FE) 325 (106 FE) MG TABS Take 1 tablet by mouth daily.      . hyaluronate sodium (RADIAPLEXRX) GEL Apply 1 application topically once.      Marland Kitchen losartan (COZAAR) 50 MG tablet       . SYMBICORT 160-4.5 MCG/ACT inhaler       . tamoxifen (NOLVADEX) 20 MG tablet Take 1 tablet (20 mg total) by mouth daily.  90 tablet  6   No current facility-administered medications for this visit.    SURGICAL HISTORY:  Past Surgical History  Procedure Laterality Date  . Cesarean section       x 2  . Appendectomy    . Leg surgery Left 1994    vascular  . Partial mastectomy with needle localization Left 01/16/2013    Procedure: PARTIAL MASTECTOMY WITH NEEDLE LOCALIZATION;  Surgeon: Adin Hector, MD;  Location: [facility];  Service: General;  Laterality: Left;  Left partial mastectomy with needle localization at breast center of [place]     REVIEW OF SYSTEMS:  Pertinent items are noted in HPI.        PHYSICAL EXAMINATION: Blood pressure 145/84, pulse 66, temperature 99.1 F (37.3 C), temperature source Oral, resp. rate 18, height 5\' 7"  (1.702 m), weight 196 lb 3.2 oz (88.996 kg). Body mass index is 30.72 kg/(m^2). ECOG PERFORMANCE STATUS: 1 - Symptomatic but completely ambulatory   General appearance: alert, cooperative and appears stated age Resp: clear  to auscultation bilaterally Cardio: regular rate and rhythm GI: soft, non-tender; bowel sounds normal; no masses,  no organomegaly Extremities: extremities normal, atraumatic, no cyanosis or edema Neurologic: Grossly normal Breast examination: leftt breast well healing surgical scar no nodularity. right breast no masses or nipple discharge  LABORATORY DATA: Lab Results  Component Value Date   WBC 6.1 02/05/2014   HGB 12.8 02/05/2014   HCT 38.9 02/05/2014   MCV 90.1 02/05/2014   PLT 256 02/05/2014      Chemistry      Component Value Date/Time   NA 144 02/05/2014 1014   NA 140 01/12/2013 1430   K 3.5 02/05/2014 1014   K 4.1 01/12/2013 1430   CL 106 02/13/2013 1303   CL 101 01/12/2013 1430   CO2 27 02/05/2014 1014   CO2 29 01/12/2013 1430   BUN 8.7 02/05/2014 1014   BUN 9 01/12/2013 1430   CREATININE 0.8 02/05/2014 1014   CREATININE 0.75 01/12/2013 1430      Component Value Date/Time   CALCIUM 9.6 02/05/2014 1014   CALCIUM 10.0 01/12/2013 1430   ALKPHOS 50 02/05/2014 1014   AST 18 02/05/2014 1014   ALT 19 02/05/2014 1014   BILITOT 0.46 02/05/2014 1014     ADDITIONAL INFORMATION: PROGNOSTIC INDICATORS - ACIS Results IMMUNOHISTOCHEMICAL AND MORPHOMETRIC ANALYSIS BY THE AUTOMATED CELLULAR IMAGING SYSTEM (ACIS) Estrogen Receptor (Negative, <1%): 100%, POSITIVE, STRONG STAINING INTENSITY Progesterone Receptor (Negative, <1%): 85%, POSITIVE, STRONG STAINING INTENSITY All controls stained appropriately [name] MD Pathologist, Electronic Signature ( Signed 01/25/2013) FINAL DIAGNOSIS Diagnosis Breast, lumpectomy, Left central - DUCTAL CARCINOMA IN SITU WITH NECROSIS, SEE COMMENT. - IN SITU CARCINOMA IS LESS THAN 2 MM FROM NEAREST MARGIN (ANTERIOR). - MICROCALCIFICATIONS IDENTIFIED. - SEE TEMPLATE BELOW. Microscopic Comment BREAST, IN SITU CARCINOMA Specimen, including laterality: Left breast Procedure: Lumpectomy Grade of carcinoma: II-III of III Necrosis: Present 1 of 3 Duplicate  copy FINAL for Tracy Perez, Tracy Perez [phone]) Microscopic Comment(continued) Estimated tumor size: (glass slide measurement): up to 4 mm (multiple foci) Treatment effect: None If present, treatment effect in breast tissue, lymph nodes or both: N/A Distance to closest margin: 2 mm If margin positive, focally or broadly: N/A Breast prognostic profile: Pending and will be reported in an addendum Estrogen receptor: Progesterone receptor: Lymph nodes: Examined: 0 Lymph nodes with metastasis: N/A TNM: pTis, pNX Comments: There are multiple foci of in situ carcinoma identified that span 4 mm in greatest dimension. Slide 1C was reviewed by Dr. Avis Epley who concurs with the presence of insitu carcinoma. The case ws discussed with Dr. Dalbert Batman on 01/19/2013 (CRR:caf 01/18/13) Mali RUND DO Pathologist, Electronic Signature (Case signed 01/19/2013) S  RADIOGRAPHIC STUDIES:  No results found.  ASSESSMENT/PLAN: 48 year old female with  #1 Stage 0(TisNx)ductal carcinoma in situ) patient is status post lumpectomy. Her tumor was ER positive PR positive. Status post lumpectomy followed by radiation  therapy she completed May 2014. Overall she tolerated it well. Patient was subsequently started on tamoxifen adjuvantly in May 2014. She is tolerating it well. She has no evidence of recurrent disease.  #2 anxiety: doing well, not using medications  #3 patient will be seen back in 6 months time for followup   All questions were answered. The patient knows to call the clinic with any problems, questions or concerns. We can certainly see the patient much sooner if necessary.The length of time of the face-to-face encounter was 15 minutes. More than 50% of time was spent counseling and coordination of care.   Marcy Panning, MD Medical/Oncology [facility] [phone] (beeper) [phone] (Office)  02/05/2014, 11:40 AM

## 2014-02-20 DIAGNOSIS — F411 Generalized anxiety disorder: Secondary | ICD-10-CM | POA: Insufficient documentation

## 2014-02-20 DIAGNOSIS — F32A Depression, unspecified: Secondary | ICD-10-CM | POA: Insufficient documentation

## 2014-02-20 DIAGNOSIS — F329 Major depressive disorder, single episode, unspecified: Secondary | ICD-10-CM | POA: Insufficient documentation

## 2014-02-20 DIAGNOSIS — E785 Hyperlipidemia, unspecified: Secondary | ICD-10-CM | POA: Insufficient documentation

## 2014-02-20 DIAGNOSIS — Z87891 Personal history of nicotine dependence: Secondary | ICD-10-CM | POA: Insufficient documentation

## 2014-02-20 DIAGNOSIS — I10 Essential (primary) hypertension: Secondary | ICD-10-CM | POA: Insufficient documentation

## 2014-04-23 ENCOUNTER — Encounter (INDEPENDENT_AMBULATORY_CARE_PROVIDER_SITE_OTHER): Payer: Self-pay

## 2014-04-23 ENCOUNTER — Ambulatory Visit (INDEPENDENT_AMBULATORY_CARE_PROVIDER_SITE_OTHER): Payer: BC Managed Care – PPO | Admitting: Internal Medicine

## 2014-04-23 ENCOUNTER — Encounter: Payer: Self-pay | Admitting: *Deleted

## 2014-04-23 ENCOUNTER — Encounter: Payer: Self-pay | Admitting: Internal Medicine

## 2014-04-23 VITALS — BP 142/86 | HR 72 | Temp 99.2°F | Ht 67.0 in | Wt 193.0 lb

## 2014-04-23 DIAGNOSIS — R058 Other specified cough: Secondary | ICD-10-CM | POA: Insufficient documentation

## 2014-04-23 DIAGNOSIS — R05 Cough: Secondary | ICD-10-CM

## 2014-04-23 MED ORDER — PANTOPRAZOLE SODIUM 40 MG PO TBEC: 40.0000 mg | DELAYED_RELEASE_TABLET | Freq: Every day | ORAL | Status: DC

## 2014-04-23 MED ORDER — FAMOTIDINE 20 MG PO TABS: ORAL_TABLET | ORAL | Status: DC

## 2014-04-23 MED ORDER — PREDNISONE 10 MG PO TABS: ORAL_TABLET | ORAL | Status: DC

## 2014-04-23 NOTE — Progress Notes (Signed)
   Subjective:    Patient ID: Tracy Perez, female    DOB: 1966/01/06  MRN: [id number]  HPI  22 yobf quit smoking 1991 and never sign resp problems until first noted cough with laughing around summer 2014  referred 04/23/2014 to pulmonary clinic by Dr Eldridge Abrahams for chronic cough    04/23/2014 1st [facility] office visit/ Marabelle Cushman  Chief Complaint  Patient presents with  . Pulmonary Consult    Referred per Eldridge Abrahams, NP. Pt c/o cough, wheezing and SOB for the past 8 months. Cough is occ prod with minimal clear sputum. Cough is triggered by laughing. She notices wheezing at night. She is using proair up to 3 x daily  cough is most notable at hs but better on saba transiently  Never rx for gerd / allergies or needing chronic maint asthma rx  No obvious other patterns in day to day or daytime variabilty or assoc chronic cough or cp or chest tightness, subjective wheeze overt sinus or hb symptoms. No unusual exp hx or h/o childhood pna/ asthma or knowledge of premature birth.    Also denies any obvious fluctuation of symptoms with weather or environmental changes or other aggravating or alleviating factors except as outlined above   Current Medications, Allergies, Complete Past Medical History, Past Surgical History, Family History, and Social History were reviewed in [employer] record.           Review of Systems  Constitutional: Negative for fever, chills and unexpected weight change.  HENT: Positive for dental problem. Negative for congestion, ear pain, nosebleeds, postnasal drip, rhinorrhea, sinus pressure, sneezing, sore throat, trouble swallowing and voice change.   Eyes: Negative for visual disturbance.  Respiratory: Positive for cough and shortness of breath. Negative for choking.   Cardiovascular: Negative for chest pain and leg swelling.  Gastrointestinal: Negative for vomiting, abdominal pain and diarrhea.  Genitourinary: Negative for difficulty  urinating.  Musculoskeletal: Negative for arthralgias.  Skin: Negative for rash.  Neurological: Negative for tremors, syncope and headaches.  Hematological: Does not bruise/bleed easily.       Objective:   Physical Exam amb bf nad with prominent pseudowheeze   Wt Readings from Last 3 Encounters:  04/23/14 193 lb (87.544 kg)  02/05/14 196 lb 3.2 oz (88.996 kg)  08/14/13 193 lb 11.2 oz (87.862 kg)      HEENT: nl dentition, turbinates, and orophanx. Nl external ear canals without cough reflex   NECK :  without JVD/Nodes/TM/ nl carotid upstrokes bilaterally   LUNGS: no acc muscle use, clear to A and P bilaterally with some  exp maneuvers but not reproducible at end exp ? True wheeze vs all transmitted pseudowheeze   CV:  RRR  no s3 or murmur or increase in P2, no edema   ABD:  soft and nontender with nl excursion in the supine position. No bruits or organomegaly, bowel sounds nl  MS:  warm without deformities, calf tenderness, cyanosis or clubbing  SKIN: warm and dry without lesions    NEURO:  alert, approp, no deficits          last cxr 3 m prior to OV  Reported nl by pt not in system  Assessment & Plan:

## 2014-04-23 NOTE — Patient Instructions (Addendum)
Prednisone 10 mg take  4 each am x 2 days,   2 each am x 2 days,  1 each am x 2 days and stop   Pantoprazole (protonix) 40 mg   Take 30-60 min before first meal of the day and Pepcid 20 mg one bedtime until return to office - this is the best way to tell whether stomach acid is contributing to your problem.    For throat tickle/ drainage take chlortrimeton (chlorpheniramine) 4 mg every 4 hours available over the counter (may cause drowsiness)   GERD (REFLUX)  is an extremely common cause of respiratory symptoms, many times with no significant heartburn at all.    It can be treated with medication, but also with lifestyle changes including avoidance of late meals, excessive alcohol, smoking cessation, and avoid fatty foods, chocolate, peppermint, colas, red wine, and acidic juices such as orange juice.  NO MINT OR MENTHOL PRODUCTS SO NO COUGH DROPS  USE SUGARLESS CANDY INSTEAD (jolley ranchers or Stover's or life saver candy)  NO OIL BASED VITAMINS - use powdered substitutes.   Proair up every 4 hours if needed with goal of twice or less per week - work on technique

## 2014-04-24 ENCOUNTER — Encounter: Payer: Self-pay | Admitting: Internal Medicine

## 2014-04-24 NOTE — Assessment & Plan Note (Signed)
The most common causes of chronic cough in immunocompetent adults include the following: upper airway cough syndrome (UACS), previously referred to as postnasal drip syndrome (PNDS), which is caused by variety of rhinosinus conditions; (2) asthma; (3) GERD; (4) chronic bronchitis from cigarette smoking or other inhaled environmental irritants; (5) nonasthmatic eosinophilic bronchitis; and (6) bronchiectasis.   These conditions, singly or in combination, have accounted for up to 94% of the causes of chronic cough in prospective studies.   Other conditions have constituted no >6% of the causes in prospective studies These have included bronchogenic carcinoma, chronic interstitial pneumonia, sarcoidosis, left ventricular failure, ACEI-induced cough, and aspiration from a condition associated with pharyngeal dysfunction.    Chronic cough is often simultaneously caused by more than one condition. A single cause has been found from 38 to 82% of the time, multiple causes from 18 to 62%. Multiply caused cough has been the result of three diseases up to 42% of the time.       Based on hx and exam, this is most likely:  Classic Upper airway cough syndrome, so named because it's frequently impossible to sort out how much is  CR/sinusitis with freq throat clearing (which can be related to primary GERD)   vs  causing  secondary (" extra esophageal")  GERD from wide swings in gastric pressure that occur with throat clearing, often  promoting self use of mint and menthol lozenges that reduce the lower esophageal sphincter tone and exacerbate the problem further in a cyclical fashion.   These are the same pts (now being labeled as having "irritable larynx syndrome" by some cough centers) who not infrequently have a history of having failed to tolerate ace inhibitors,  dry powder inhalers or biphosphonates or report having atypical reflux symptoms that don't respond to standard doses of PPI , and are easily confused as  having aecopd or asthma flares by even experienced allergists/ pulmonologists.   The first step is to maximize acid suppression and eliminate cyclical coughing then regroup if the cough persists or the perceived need for saba continues more than twice weekly as could have developed cough variant asthma as well   See instructions for specific recommendations which were reviewed directly with the patient who was given a copy with highlighter outlining the key components.

## 2014-05-23 ENCOUNTER — Encounter: Payer: Self-pay | Admitting: Internal Medicine

## 2014-05-23 ENCOUNTER — Encounter: Payer: Self-pay | Admitting: *Deleted

## 2014-05-23 ENCOUNTER — Ambulatory Visit (INDEPENDENT_AMBULATORY_CARE_PROVIDER_SITE_OTHER)
Admission: RE | Admit: 2014-05-23 | Discharge: 2014-05-23 | Disposition: A | Discharge status: Home / Self Care | Payer: BC Managed Care – PPO | Source: Ambulatory Visit | Attending: Internal Medicine | Admitting: Internal Medicine

## 2014-05-23 ENCOUNTER — Ambulatory Visit (INDEPENDENT_AMBULATORY_CARE_PROVIDER_SITE_OTHER): Payer: BC Managed Care – PPO | Admitting: Internal Medicine

## 2014-05-23 VITALS — BP 122/74 | HR 88 | Temp 98.1°F | Ht 67.0 in | Wt 196.4 lb

## 2014-05-23 DIAGNOSIS — R05 Cough: Secondary | ICD-10-CM

## 2014-05-23 DIAGNOSIS — R058 Other specified cough: Secondary | ICD-10-CM

## 2014-05-23 DIAGNOSIS — J45991 Cough variant asthma: Secondary | ICD-10-CM | POA: Insufficient documentation

## 2014-05-23 DIAGNOSIS — R059 Cough, unspecified: Secondary | ICD-10-CM

## 2014-05-23 MED ORDER — TRAMADOL HCL 50 MG PO TABS: ORAL_TABLET | ORAL | Status: DC

## 2014-05-23 MED ORDER — MOMETASONE FURO-FORMOTEROL FUM 100-5 MCG/ACT IN AERO: INHALATION_SPRAY | RESPIRATORY_TRACT | Status: DC

## 2014-05-23 MED ORDER — PREDNISONE 10 MG PO TABS: ORAL_TABLET | ORAL | Status: DC

## 2014-05-23 NOTE — Progress Notes (Signed)
Subjective:    Patient ID: Tracy Perez, female    DOB: [birth year]  MRN: [id number]   Brief patient profile:  67 yobf quit smoking 1991 and never sign resp problems until first noted cough with laughing around summer 2014  referred 04/23/2014 to pulmonary clinic by Dr Eldridge Abrahams for persistent cough     History of Present Illness  04/23/2014 1st [facility] office visit/ Yanitza Shvartsman  Chief Complaint  Patient presents with  . Pulmonary Consult    Referred per Eldridge Abrahams, NP. Pt c/o cough, wheezing and SOB for the past 8 months. Cough is occ prod with minimal clear sputum. Cough is triggered by laughing. She notices wheezing at night. She is using proair up to 3 x daily  cough is most notable at hs but better on saba transiently  Never rx for gerd / allergies or needing chronic maint asthma rx rec Prednisone 10 mg take  4 each am x 2 days,   2 each am x 2 days,  1 each am x 2 days and stop  Pantoprazole (protonix) 40 mg   Take 30-60 min before first meal of the day and Pepcid 20 mg one bedtime until return to office - this is the best way to tell whether stomach acid is contributing to your problem.   For throat tickle/ drainage take chlortrimeton (chlorpheniramine) 4 mg every 4 hours available over the counter (may cause drowsiness)  GERD  Diet  Proair up every 4 hours if needed with goal of twice or less per week - work on technique    05/23/2014 f/u ov/Nicko Daher re: uacs vs asthma  Chief Complaint  Patient presents with  . Follow-up    Cough is much improved, but still has "rattle" in chest and feels congested in her chest. Her breathing is some better, but not back to her baseline and she is using her rescue inhaler at least 2 x per day.   still needed proair while on prednisone but much less and then  More 3-5 d p  finished including 5 h prior to OV   Cough at hs is gone now and sleeping better  Using chlorpheniramine up to every 6 hours still with urge to clear the throat.   No  obvious day to day or daytime variabilty or assoc excess/ purulent sputm cp or   overt sinus or hb symptoms. No unusual exp hx or h/o childhood pna/ asthma or knowledge of premature birth.  Sleeping ok without nocturnal  or early am exacerbation  of respiratory  c/o's or need for noct saba. Also denies any obvious fluctuation of symptoms with weather or environmental changes or other aggravating or alleviating factors except as outlined above   Current Medications, Allergies, Complete Past Medical History, Past Surgical History, Family History, and Social History were reviewed in [employer] record.  ROS  The following are not active complaints unless bolded sore throat, dysphagia, dental problems, itching, sneezing,  nasal congestion or excess/ purulent secretions, ear ache,   fever, chills, sweats, unintended wt loss, pleuritic or exertional cp, hemoptysis,  orthopnea pnd or leg swelling, presyncope, palpitations, heartburn, abdominal pain, anorexia, nausea, vomiting, diarrhea  or change in bowel or urinary habits, change in stools or urine, dysuria,hematuria,  rash, arthralgias, visual complaints, headache, numbness weakness or ataxia or problems with walking or coordination,  change in mood/affect or memory.  Objective:   Physical Exam amb bf nad with prominent pseudowheeze/ somewhat hopeless affect     05/23/2014          196  Wt Readings from Last 3 Encounters:  04/23/14 193 lb (87.544 kg)  02/05/14 196 lb 3.2 oz (88.996 kg)  08/14/13 193 lb 11.2 oz (87.862 kg)      HEENT: nl dentition, turbinates, and orophanx. Nl external ear canals without cough reflex   NECK :  without JVD/Nodes/TM/ nl carotid upstrokes bilaterally   LUNGS: no acc muscle use, clear to A and P bilaterally with some  exp maneuvers but not reproducible at end exp ? True wheeze vs all transmitted pseudowheeze   CV:  RRR  no s3 or murmur or increase in P2, no edema    ABD:  soft and nontender with nl excursion in the supine position. No bruits or organomegaly, bowel sounds nl  MS:  warm without deformities, calf tenderness, cyanosis or clubbing  SKIN: warm and dry without lesions    NEURO:  alert, approp, no deficits     CXR  05/23/2014 : wnl     Assessment & Plan:

## 2014-05-23 NOTE — Assessment & Plan Note (Addendum)
Not clear how much of her symptoms are at least partly related to   Upper airway cough syndrome, so named because it's frequently impossible to sort out how much is  CR/sinusitis with freq throat clearing (which can be related to primary GERD)   vs  causing  secondary (" extra esophageal")  GERD from wide swings in gastric pressure that occur with throat clearing, often  promoting self use of mint and menthol lozenges that reduce the lower esophageal sphincter tone and exacerbate the problem further in a cyclical fashion.   These are the same pts (now being labeled as having "irritable larynx syndrome" by some cough centers) who not infrequently have a history of having failed to tolerate ace inhibitors,  dry powder inhalers or biphosphonates or report having atypical reflux symptoms that don't respond to standard doses of PPI , and are easily confused as having aecopd or asthma flares by even experienced allergists/ pulmonologists.   In this case she could certainly have both. For now continue max gerd rx, add 1st gen  H1 per guidelines and eliminate cyclical cough with tramadol then regroup

## 2014-05-23 NOTE — Assessment & Plan Note (Signed)
-   spirometry 05/23/2014  FEV1 0.93 (37%) ratio 55 but poor reproducibility / ok waveform   The response even briefly to prednisone was convincing enough to warrant trial of dulera 100 Take 2 puffs first thing in am and then another 2 puffs about 12 hours later.    The proper method of use, as well as anticipated side effects, of a metered-dose inhaler are discussed and demonstrated to the patient. Improved effectiveness after extensive coaching during this visit to a level of approximately  90%

## 2014-05-23 NOTE — Patient Instructions (Addendum)
Start dulera 100 Take 2 puffs first thing in am and then another 2 puffs about 12 hours later.    Work on inhaler technique:  relax and gently blow all the way out then take a nice smooth deep breath back in, triggering the inhaler at same time you start breathing in.  Hold for up to 5 seconds if you can.  Rinse and gargle with water when done     For drainage take chlortrimeton (chlorpheniramine) 4 mg every 4 hours available over the counter (may cause drowsiness)   For cough Take delsym two tsp every 12 hours and supplement if needed with  tramadol 50 mg up to 2 every 4 hours to suppress the urge to cough. Swallowing water or using ice chips/non mint and menthol containing candies (such as lifesavers or sugarless jolly ranchers) are also effective.  You should rest your voice and avoid activities that you know make you cough.  Once you have eliminated the cough for 3 straight days try reducing the tramadol first,  then the delsym as tolerated.    For breathing: Only use your albuterol (proair)as a rescue medication to be used if you can't catch your breath by resting or doing a relaxed purse lip breathing pattern.  - The less you use it, the better it will work when you need it. - Ok to use up to 2 puffs  every 4 hours if you must but call for immediate appointment if use goes up over your usual need - Don't leave home without it !!  (think of it like the spare tire for your car)    Please remember to go to the  x-ray department downstairs for your tests - we will call you with the results when they are available.  Please schedule a follow up office visit in 2  weeks, sooner if needed

## 2014-05-24 ENCOUNTER — Telehealth: Payer: Self-pay | Admitting: Internal Medicine

## 2014-05-24 NOTE — Progress Notes (Signed)
Quick Note:  Spoke with pt and notified of results per Dr. Wert. Pt verbalized understanding and denied any questions.  ______ 

## 2014-05-24 NOTE — Telephone Encounter (Signed)
I called and spoke with the pt and notified her of her cxr results  She verbalized understanding and nothing further needed

## 2014-05-24 NOTE — Progress Notes (Signed)
Quick Note:  lmtcb ______ 

## 2014-06-06 ENCOUNTER — Ambulatory Visit: Payer: BC Managed Care – PPO | Admitting: Internal Medicine

## 2014-06-11 ENCOUNTER — Ambulatory Visit (INDEPENDENT_AMBULATORY_CARE_PROVIDER_SITE_OTHER): Payer: BC Managed Care – PPO | Admitting: Internal Medicine

## 2014-06-11 ENCOUNTER — Encounter: Payer: Self-pay | Admitting: Internal Medicine

## 2014-06-11 VITALS — BP 124/84 | HR 90 | Temp 99.2°F | Ht 67.0 in | Wt 197.0 lb

## 2014-06-11 DIAGNOSIS — J45991 Cough variant asthma: Secondary | ICD-10-CM

## 2014-06-11 MED ORDER — MOMETASONE FURO-FORMOTEROL FUM 100-5 MCG/ACT IN AERO: INHALATION_SPRAY | RESPIRATORY_TRACT | Status: DC

## 2014-06-11 NOTE — Patient Instructions (Addendum)
Stay on dulera 100 Take 2 puffs first thing in am and then another 2 puffs about 12 hours later. (or formulary alternative/preferred)  For drainage take chlortrimeton (chlorpheniramine) 4 mg every 4 hours available over the counter (may cause drowsiness - if not tolerable zytrec then allegra/clarition.   Please schedule a follow up visit in 3 months but call sooner if needed

## 2014-06-11 NOTE — Assessment & Plan Note (Signed)
-   spirometry 05/23/2014  FEV1 0.93 (37%) ratio 55 but poor reproducibility / ok waveform   05/23/2014 p extensive coaching HFA effectiveness =    90%  - dulera 100 2 bid trial 05/23/2014 > better so this is clearly a steroid responsive problem and fits with asthma with an element of upper airway cough syndrome as well    Each maintenance medication was reviewed in detail including most importantly the difference between maintenance and as needed and under what circumstances the prns are to be used.  Please see instructions for details which were reviewed in writing and the patient given a copy.

## 2014-06-11 NOTE — Progress Notes (Signed)
Subjective:    Patient ID: Tracy Perez, female    DOB: 05-Jun-1966  MRN: [id number]   Brief patient profile:  69 yobf quit smoking 1991 and never sign resp problems until first noted cough with laughing around summer 2014  referred 04/23/2014 to pulmonary clinic by Dr Eldridge Abrahams for persistent cough     History of Present Illness  04/23/2014 1st [facility] office visit/ Tracy Perez  Chief Complaint  Patient presents with  . Pulmonary Consult    Referred per Eldridge Abrahams, NP. Pt c/o cough, wheezing and SOB for the past 8 months. Cough is occ prod with minimal clear sputum. Cough is triggered by laughing. She notices wheezing at night. She is using proair up to 3 x daily  cough is most notable at hs but better on saba transiently  Never rx for gerd / allergies or needing chronic maint asthma rx rec Prednisone 10 mg take  4 each am x 2 days,   2 each am x 2 days,  1 each am x 2 days and stop  Pantoprazole (protonix) 40 mg   Take 30-60 min before first meal of the day and Pepcid 20 mg one bedtime until return to office - this is the best way to tell whether stomach acid is contributing to your problem.   For throat tickle/ drainage take chlortrimeton (chlorpheniramine) 4 mg every 4 hours available over the counter (may cause drowsiness)  GERD  Diet  Proair up every 4 hours if needed with goal of twice or less per week - work on technique    05/23/2014 f/u ov/Tracy Perez re: uacs vs asthma  Chief Complaint  Patient presents with  . Follow-up    Cough is much improved, but still has "rattle" in chest and feels congested in her chest. Her breathing is some better, but not back to her baseline and she is using her rescue inhaler at least 2 x per day.   still needed proair while on prednisone but much less and then  More 3-5 d p  finished including 5 h prior to OV   Cough at hs is gone now and sleeping better  Using chlorpheniramine up to every 6 hours still with urge to clear the  throat. rec Start dulera 100 Take 2 puffs first thing in am and then another 2 puffs about 12 hours later.  Work on inhaler technique:     For drainage take chlortrimeton (chlorpheniramine) 4 mg every 4 hours available over the counter (may cause drowsiness) For cough Take delsym two tsp every 12 hours and supplement if needed with  tramadol 50 mg up to 2 every 4 hours to suppress the urge to cough. Swallowing water or using ice chips/non mint and menthol containing candies (such as lifesavers or sugarless jolly ranchers) are also effective.  You should rest your voice and avoid activities that you know make you cough.  Once you have eliminated the cough for 3 straight days try reducing the tramadol first,  then the delsym as tolerated.   For breathing: Only use your albuterol (proair)as a rescue medication   06/11/2014 f/u ov/Tracy Perez re: asthma > uacs  Chief Complaint  Patient presents with  . Follow-up    Pt states that her cough is much improved. Still has some minimal PND and dryness in her throat. Breathing is also much improved. She has not had to use rescue inhaler in the past wk.     All smiles, much brighter affect, no  longer dep on saba, no flare off pred so far, no need for tramadol    No obvious day to day or daytime variabilty or assoc excess/ purulent sputm cp or   overt sinus or hb symptoms. No unusual exp hx or h/o childhood pna/ asthma or knowledge of premature birth.  Sleeping ok without nocturnal  or early am exacerbation  of respiratory  c/o's or need for noct saba. Also denies any obvious fluctuation of symptoms with weather or environmental changes or other aggravating or alleviating factors except as outlined above   Current Medications, Allergies, Complete Past Medical History, Past Surgical History, Family History, and Social History were reviewed in [employer] record.  ROS  The following are not active complaints unless bolded sore throat,  dysphagia, dental problems, itching, sneezing,  nasal congestion or excess/ purulent secretions, ear ache,   fever, chills, sweats, unintended wt loss, pleuritic or exertional cp, hemoptysis,  orthopnea pnd or leg swelling, presyncope, palpitations, heartburn, abdominal pain, anorexia, nausea, vomiting, diarrhea  or change in bowel or urinary habits, change in stools or urine, dysuria,hematuria,  rash, arthralgias, visual complaints, headache, numbness weakness or ataxia or problems with walking or coordination,  change in mood/affect or memory.                      Objective:   Physical Exam amb bf nad      05/23/2014          196   > 06/11/2014   197  Wt Readings from Last 3 Encounters:  04/23/14 193 lb (87.544 kg)  02/05/14 196 lb 3.2 oz (88.996 kg)  08/14/13 193 lb 11.2 oz (87.862 kg)      HEENT: nl dentition, turbinates, and orophanx. Nl external ear canals without cough reflex   NECK :  without JVD/Nodes/TM/ nl carotid upstrokes bilaterally   LUNGS: no acc muscle use, clear to A and P bilaterally with some  exp maneuvers but not reproducible at end exp ? True wheeze vs all transmitted pseudowheeze   CV:  RRR  no s3 or murmur or increase in P2, no edema   ABD:  soft and nontender with nl excursion in the supine position. No bruits or organomegaly, bowel sounds nl  MS:  warm without deformities, calf tenderness, cyanosis or clubbing  SKIN: warm and dry without lesions    NEURO:  alert, approp, no deficits     CXR  05/23/2014 : wnl     Assessment & Plan:

## 2014-07-05 ENCOUNTER — Other Ambulatory Visit: Payer: Self-pay

## 2014-07-05 ENCOUNTER — Other Ambulatory Visit: Payer: Self-pay | Admitting: *Deleted

## 2014-07-05 DIAGNOSIS — C50119 Malignant neoplasm of central portion of unspecified female breast: Secondary | ICD-10-CM

## 2014-07-05 DIAGNOSIS — D0592 Unspecified type of carcinoma in situ of left breast: Secondary | ICD-10-CM

## 2014-07-05 MED ORDER — TAMOXIFEN CITRATE 20 MG PO TABS: 20.0000 mg | ORAL_TABLET | Freq: Every day | ORAL | Status: DC

## 2014-07-05 NOTE — Progress Notes (Signed)
Tamoxifen ordered by triage adequate to end of September.  6 mo follow up needed - pof sent.  Lab orders entered.

## 2014-07-10 ENCOUNTER — Telehealth: Payer: Self-pay | Admitting: Hematology and Oncology

## 2014-07-10 NOTE — Telephone Encounter (Signed)
, °

## 2014-08-07 ENCOUNTER — Telehealth: Payer: Self-pay | Admitting: Hematology and Oncology

## 2014-08-07 NOTE — Telephone Encounter (Signed)
Pt called to r/s can't make the date of the apt and also updated address, mailed out new sch per pt req...Marland KitchenMarland KitchenKJ

## 2014-08-09 ENCOUNTER — Other Ambulatory Visit: Payer: BC Managed Care – PPO

## 2014-08-09 ENCOUNTER — Ambulatory Visit: Payer: BC Managed Care – PPO | Admitting: Hematology and Oncology

## 2014-08-31 ENCOUNTER — Ambulatory Visit (HOSPITAL_BASED_OUTPATIENT_CLINIC_OR_DEPARTMENT_OTHER): Payer: BC Managed Care – PPO | Admitting: Hematology and Oncology

## 2014-08-31 ENCOUNTER — Other Ambulatory Visit (HOSPITAL_BASED_OUTPATIENT_CLINIC_OR_DEPARTMENT_OTHER): Payer: BC Managed Care – PPO

## 2014-08-31 ENCOUNTER — Telehealth: Payer: Self-pay | Admitting: Hematology and Oncology

## 2014-08-31 VITALS — BP 136/90 | HR 78 | Temp 99.4°F | Resp 18 | Ht 67.0 in | Wt 196.3 lb

## 2014-08-31 DIAGNOSIS — C50112 Malignant neoplasm of central portion of left female breast: Secondary | ICD-10-CM

## 2014-08-31 DIAGNOSIS — D0512 Intraductal carcinoma in situ of left breast: Secondary | ICD-10-CM

## 2014-08-31 DIAGNOSIS — C50119 Malignant neoplasm of central portion of unspecified female breast: Secondary | ICD-10-CM

## 2014-08-31 LAB — CBC WITH DIFFERENTIAL/PLATELET
BASO%: 0.4 % (ref 0.0–2.0)
Basophils Absolute: 0 10*3/uL (ref 0.0–0.1)
EOS%: 5.8 % (ref 0.0–7.0)
Eosinophils Absolute: 0.3 10*3/uL (ref 0.0–0.5)
HCT: 40.3 % (ref 34.8–46.6)
HGB: 13 g/dL (ref 11.6–15.9)
LYMPH%: 25 % (ref 14.0–49.7)
MCH: 28.6 pg (ref 25.1–34.0)
MCHC: 32.3 g/dL (ref 31.5–36.0)
MCV: 88.7 fL (ref 79.5–101.0)
MONO#: 0.5 10*3/uL (ref 0.1–0.9)
MONO%: 8.5 % (ref 0.0–14.0)
NEUT%: 60.3 % (ref 38.4–76.8)
NEUTROS ABS: 3.5 10*3/uL (ref 1.5–6.5)
PLATELETS: 234 10*3/uL (ref 145–400)
RBC: 4.55 10*6/uL (ref 3.70–5.45)
RDW: 12.4 % (ref 11.2–14.5)
WBC: 5.8 10*3/uL (ref 3.9–10.3)
lymph#: 1.5 10*3/uL (ref 0.9–3.3)

## 2014-08-31 LAB — COMPREHENSIVE METABOLIC PANEL (CC13)
ALBUMIN: 3.6 g/dL (ref 3.5–5.0)
ALT: 13 U/L (ref 0–55)
AST: 18 U/L (ref 5–34)
Alkaline Phosphatase: 50 U/L (ref 40–150)
Anion Gap: 6 mEq/L (ref 3–11)
BILIRUBIN TOTAL: 0.55 mg/dL (ref 0.20–1.20)
BUN: 9.4 mg/dL (ref 7.0–26.0)
CO2: 28 mEq/L (ref 22–29)
Calcium: 9.3 mg/dL (ref 8.4–10.4)
Chloride: 107 mEq/L (ref 98–109)
Creatinine: 0.8 mg/dL (ref 0.6–1.1)
Glucose: 99 mg/dl (ref 70–140)
Potassium: 4.1 mEq/L (ref 3.5–5.1)
Sodium: 142 mEq/L (ref 136–145)
Total Protein: 6.9 g/dL (ref 6.4–8.3)

## 2014-08-31 NOTE — Progress Notes (Signed)
Patient Care Team: Berkley Harvey, NP as PCP - General (Nurse Practitioner)  DIAGNOSIS: No matching staging information was found for the patient.  SUMMARY OF ONCOLOGIC HISTORY:  No history exists.    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 She is tolerating tamoxifen extremely well without any major problems. She does have occasional hot flashes and myalgias. She is also not had a menstrual period so she is undergoing menopausal changes as well. She denies any new lumps or nodules in the breast. Occasionally she gets a sharp pain in the left breast which had surgery.  REVIEW OF SYSTEMS:   Constitutional: Denies fevers, chills or abnormal weight loss Eyes: Denies blurriness of vision Ears, nose, mouth, throat, and face: Denies mucositis or sore throat Respiratory: Denies cough, dyspnea or wheezes Cardiovascular: Denies palpitation, chest discomfort or lower extremity swelling Gastrointestinal:  Denies nausea, heartburn or change in bowel habits Skin: Denies abnormal skin rashes Lymphatics: Denies new lymphadenopathy or easy bruising Neurological:Denies numbness, tingling or new weaknesses Behavioral/Psych: Mood is stable, no new changes  Breast: Occasional left breast pain  All other systems were reviewed with the patient and are negative.  I have reviewed the past medical history, past surgical history, social history and family history with the patient and they are unchanged from previous note.  ALLERGIES:  is allergic to gadolinium derivatives.  MEDICATIONS:  Current Outpatient Prescriptions  Medication Sig Dispense Refill  . chlorthalidone (HYGROTON) 25 MG tablet Take 12.5 mg by mouth daily.       . cholecalciferol (VITAMIN D) 1000 UNITS tablet Take 1,000 Units by mouth daily.       . Iron TABS Take 1 tablet by mouth daily.      Marland Kitchen losartan (COZAAR) 50 MG tablet Take 50 mg by mouth daily.       . tamoxifen (NOLVADEX) 20 MG tablet Take 1 tablet (20 mg total) by mouth daily.  90 tablet  0  . albuterol (PROAIR HFA) 108 (90 BASE) MCG/ACT inhaler Inhale 2 puffs into the lungs every 6 (six) hours as needed for wheezing or shortness of breath.      . famotidine (PEPCID) 20 MG tablet One at bedtime  30 tablet  2  . mometasone-formoterol (DULERA) 100-5 MCG/ACT AERO Take 2 puffs first thing in am and then another 2 puffs about 12 hours later.  1 Inhaler  11  . pantoprazole (PROTONIX) 40 MG tablet Take 1 tablet (40 mg total) by mouth daily. Take 30-60 min before first meal of the day  30 tablet  2  . traMADol (ULTRAM) 50 MG tablet 1-2 every 4 hours as needed for cough or pain  40 tablet  0   No current facility-administered medications for this visit.    PHYSICAL EXAMINATION: ECOG PERFORMANCE STATUS: 0 - Asymptomatic  Filed Vitals:   08/31/14 0915  BP: 136/90  Pulse: 78  Temp: 99.4 F (37.4 C)  Resp: 18   Filed Weights   08/31/14 0915  Weight: 196 lb 4.8 oz (89.041 kg)    GENERAL:alert, no distress and comfortable SKIN: skin color, texture, turgor are normal, no rashes or significant lesions EYES: normal, Conjunctiva are pink and non-injected, sclera clear OROPHARYNX:no exudate, no erythema and lips, buccal mucosa, and tongue normal  NECK: supple, thyroid normal size, non-tender, without nodularity LYMPH:  no palpable lymphadenopathy in the cervical, axillary or inguinal LUNGS: clear to auscultation and percussion with normal breathing effort HEART: regular rate & rhythm and no murmurs and no lower extremity edema ABDOMEN:abdomen soft, non-tender and normal bowel sounds Musculoskeletal:no cyanosis of digits and no clubbing  NEURO: alert & oriented x 3 with fluent speech, no focal motor/sensory deficits BREAST: No palpable masses or nodules in either right or left  breasts. No palpable axillary supraclavicular or infraclavicular adenopathy no breast tenderness or nipple discharge.   LABORATORY DATA:  I have reviewed the data as listed   Chemistry      Component Value Date/Time   NA 142 08/31/2014 0906   NA 140 01/12/2013 1430   K 4.1 08/31/2014 0906   K 4.1 01/12/2013 1430   CL 106 02/13/2013 1303   CL 101 01/12/2013 1430   CO2 28 08/31/2014 0906   CO2 29 01/12/2013 1430   BUN 9.4 08/31/2014 0906   BUN 9 01/12/2013 1430   CREATININE 0.8 08/31/2014 0906   CREATININE 0.75 01/12/2013 1430      Component Value Date/Time   CALCIUM 9.3 08/31/2014 0906   CALCIUM 10.0 01/12/2013 1430   ALKPHOS 50 08/31/2014 0906   AST 18 08/31/2014 0906   ALT 13 08/31/2014 0906   BILITOT 0.55 08/31/2014 0906       Lab Results  Component Value Date   WBC 5.8 08/31/2014   HGB 13.0 08/31/2014   HCT 40.3 08/31/2014   MCV 88.7 08/31/2014   PLT 234 08/31/2014   NEUTROABS 3.5 08/31/2014     RADIOGRAPHIC STUDIES: I have personally reviewed the radiology reports and agreed with their findings. No results found.   ASSESSMENT & PLAN:  Cancer of central portion of left female breast Left breast DCIS treated with lumpectomy and adjuvant radiation therapy and currently on tamoxifen since June 2014.  Tamoxifen counseling: Patient is tolerating tamoxifen extremely well without any major problems or concerns. She has menopausal symptoms with occasional hot flashes.  Surveillance: Today's breast exam was normal. I reviewed the mammogram done in February 2015 which is normal. She will set up a mammogram for February of 2016. I will see her back in 6 months for followup.  Survivorship: I strongly encouraged her to do more physical activity and exercise. She was very much willing to go back to doing exercise.     Orders Placed This Encounter  Procedures  . CBC with Differential    Standing Status: Future     Number of Occurrences:      Standing Expiration Date: 08/31/2015  .  Comprehensive metabolic panel (Cmet) - [facility]    Standing Status: Future     Number of Occurrences:      Standing Expiration Date: 08/31/2015   The patient has a good understanding of the overall plan. she agrees with it. She will call with any problems that may develop before her next visit here.  I spent 15 minutes counseling the patient face to face. The total time spent in the appointment was 20 minutes and more than 50% was on counseling and review of test results    Rulon Eisenmenger, MD 08/31/2014 10:06 AM

## 2014-08-31 NOTE — Assessment & Plan Note (Signed)
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  Tamoxifen counseling: Patient is tolerating tamoxifen extremely well without any major problems or concerns. She has menopausal symptoms with occasional hot flashes.  Surveillance: Today's breast exam was normal. I reviewed the mammogram done in February 2015 which is normal. She will set up a mammogram for February of 2016. I will see her back in 6 months for followup.  Survivorship: I strongly encouraged her to do more physical activity and exercise. She was very much willing to go back to doing exercise.

## 2014-08-31 NOTE — Telephone Encounter (Signed)
per pof to sch pt appt-gave pt copy of sch °

## 2014-09-24 ENCOUNTER — Encounter: Payer: Self-pay | Admitting: Internal Medicine

## 2014-12-07 ENCOUNTER — Other Ambulatory Visit: Payer: Self-pay | Admitting: *Deleted

## 2014-12-07 DIAGNOSIS — D0592 Unspecified type of carcinoma in situ of left breast: Secondary | ICD-10-CM

## 2014-12-07 MED ORDER — TAMOXIFEN CITRATE 20 MG PO TABS: 20.0000 mg | ORAL_TABLET | Freq: Every day | ORAL | Status: DC

## 2014-12-31 ENCOUNTER — Telehealth: Payer: Self-pay | Admitting: *Deleted

## 2014-12-31 NOTE — Telephone Encounter (Signed)
Patient callled wanting to know if it was ok to take this herbal product Garcinia-Combgia/green coffee cleanser for colon. Advised patient ok to take per Dr. Lindi Adie. Patient verbalized understanding.

## 2014-12-31 NOTE — Telephone Encounter (Signed)
PT. IS ON TAMOXIFEN. IS IT OK TO TAKE THE ABOVE HERBAL PRODUCTS? THIS NOTE WAS SENT TO [place] AND HIS NURSE, Boyd

## 2015-01-14 ENCOUNTER — Other Ambulatory Visit: Payer: Self-pay | Admitting: Hematology and Oncology

## 2015-01-14 ENCOUNTER — Other Ambulatory Visit: Payer: Self-pay | Admitting: Nurse Practitioner

## 2015-01-14 DIAGNOSIS — Z9889 Other specified postprocedural states: Secondary | ICD-10-CM

## 2015-01-14 DIAGNOSIS — Z853 Personal history of malignant neoplasm of breast: Secondary | ICD-10-CM

## 2015-01-21 ENCOUNTER — Ambulatory Visit
Admission: RE | Admit: 2015-01-21 | Discharge: 2015-01-21 | Disposition: A | Discharge status: Home / Self Care | Payer: BC Managed Care – PPO | Source: Ambulatory Visit | Attending: Hematology and Oncology | Admitting: Hematology and Oncology

## 2015-01-21 DIAGNOSIS — Z853 Personal history of malignant neoplasm of breast: Secondary | ICD-10-CM

## 2015-01-21 DIAGNOSIS — Z9889 Other specified postprocedural states: Secondary | ICD-10-CM

## 2015-02-07 ENCOUNTER — Ambulatory Visit: Payer: BC Managed Care – PPO | Admitting: Oncology

## 2015-02-07 ENCOUNTER — Other Ambulatory Visit: Payer: BC Managed Care – PPO

## 2015-02-12 ENCOUNTER — Telehealth: Payer: Self-pay | Admitting: Hematology and Oncology

## 2015-02-12 NOTE — Telephone Encounter (Signed)
Spoke with patient and she is aware of her new appointment   Tracy Perez

## 2015-03-06 ENCOUNTER — Telehealth: Payer: Self-pay | Admitting: Hematology and Oncology

## 2015-03-06 NOTE — Telephone Encounter (Signed)
Returned patients call as she has a chest cold and request to reschedule her appointmen,done  anne

## 2015-03-07 ENCOUNTER — Ambulatory Visit: Payer: BC Managed Care – PPO | Admitting: Hematology and Oncology

## 2015-03-07 ENCOUNTER — Other Ambulatory Visit: Payer: BC Managed Care – PPO

## 2015-03-08 ENCOUNTER — Other Ambulatory Visit: Payer: BC Managed Care – PPO

## 2015-03-08 ENCOUNTER — Ambulatory Visit: Payer: BC Managed Care – PPO | Admitting: Hematology and Oncology

## 2015-03-18 ENCOUNTER — Telehealth: Payer: Self-pay | Admitting: Hematology and Oncology

## 2015-03-18 ENCOUNTER — Ambulatory Visit (HOSPITAL_BASED_OUTPATIENT_CLINIC_OR_DEPARTMENT_OTHER): Payer: BC Managed Care – PPO | Admitting: Hematology and Oncology

## 2015-03-18 ENCOUNTER — Other Ambulatory Visit (HOSPITAL_BASED_OUTPATIENT_CLINIC_OR_DEPARTMENT_OTHER): Payer: BC Managed Care – PPO

## 2015-03-18 VITALS — BP 120/92 | HR 71 | Temp 98.8°F | Resp 18 | Ht 67.0 in | Wt 195.9 lb

## 2015-03-18 DIAGNOSIS — D0512 Intraductal carcinoma in situ of left breast: Secondary | ICD-10-CM

## 2015-03-18 DIAGNOSIS — C50112 Malignant neoplasm of central portion of left female breast: Secondary | ICD-10-CM

## 2015-03-18 LAB — CBC WITH DIFFERENTIAL/PLATELET
BASO%: 0.1 % (ref 0.0–2.0)
Basophils Absolute: 0 10*3/uL (ref 0.0–0.1)
EOS%: 1.7 % (ref 0.0–7.0)
Eosinophils Absolute: 0.1 10*3/uL (ref 0.0–0.5)
HEMATOCRIT: 39.5 % (ref 34.8–46.6)
HGB: 12.9 g/dL (ref 11.6–15.9)
LYMPH#: 1.8 10*3/uL (ref 0.9–3.3)
LYMPH%: 23.9 % (ref 14.0–49.7)
MCH: 29.6 pg (ref 25.1–34.0)
MCHC: 32.7 g/dL (ref 31.5–36.0)
MCV: 90.6 fL (ref 79.5–101.0)
MONO#: 0.6 10*3/uL (ref 0.1–0.9)
MONO%: 8.1 % (ref 0.0–14.0)
NEUT#: 5 10*3/uL (ref 1.5–6.5)
NEUT%: 66.2 % (ref 38.4–76.8)
PLATELETS: 245 10*3/uL (ref 145–400)
RBC: 4.36 10*6/uL (ref 3.70–5.45)
RDW: 12.4 % (ref 11.2–14.5)
WBC: 7.5 10*3/uL (ref 3.9–10.3)

## 2015-03-18 LAB — COMPREHENSIVE METABOLIC PANEL (CC13)
ALT: 38 U/L (ref 0–55)
AST: 31 U/L (ref 5–34)
Albumin: 3.4 g/dL — ABNORMAL LOW (ref 3.5–5.0)
Alkaline Phosphatase: 54 U/L (ref 40–150)
Anion Gap: 13 mEq/L — ABNORMAL HIGH (ref 3–11)
BUN: 8.8 mg/dL (ref 7.0–26.0)
CHLORIDE: 105 meq/L (ref 98–109)
CO2: 23 meq/L (ref 22–29)
Calcium: 8.7 mg/dL (ref 8.4–10.4)
Creatinine: 0.7 mg/dL (ref 0.6–1.1)
EGFR: >90 mL/min/{1.73_m2} (ref >90)
GLUCOSE: 91 mg/dL (ref 70–140)
Potassium: 3.8 mEq/L (ref 3.5–5.1)
Sodium: 141 mEq/L (ref 136–145)
TOTAL PROTEIN: 6.5 g/dL (ref 6.4–8.3)
Total Bilirubin: 0.45 mg/dL (ref 0.20–1.20)

## 2015-03-18 NOTE — Telephone Encounter (Signed)
Appointments made and avs printed for patient °

## 2015-03-18 NOTE — Progress Notes (Signed)
Patient Care Team: Berkley Harvey, NP as PCP - General (Nurse Practitioner)  DIAGNOSIS: DCIS Treatment plan: Tamoxifen started June 2014 CHIEF COMPLIANT:  Follow-up on tamoxifen  INTERVAL HISTORY: Tracy Perez is a 49 year old with above-mentioned history of DCIS who is currently being treated with adjuvant tamoxifen. She is tolerating it extremely well. She does have occasional hot flashes. But these are menopausal symptoms and she appears to be tolerating it fairly well. Denies any lumps or nodules in the breast. She had a mammogram in February which was normal.   REVIEW OF SYSTEMS:   Constitutional: Denies fevers, chills or abnormal weight loss Eyes: Denies blurriness of vision Ears, nose, mouth, throat, and face: Denies mucositis or sore throat Respiratory: Denies cough, dyspnea or wheezes Cardiovascular: Denies palpitation, chest discomfort or lower extremity swelling Gastrointestinal:  Denies nausea, heartburn or change in bowel habits Skin: Denies abnormal skin rashes Lymphatics: Denies new lymphadenopathy or easy bruising Neurological:Denies numbness, tingling or new weaknesses Behavioral/Psych: Mood is stable, no new changes  Breast:  denies any pain or lumps or nodules in either breasts All other systems were reviewed with the patient and are negative.  I have reviewed the past medical history, past surgical history, social history and family history with the patient and they are unchanged from previous note.  ALLERGIES:  is allergic to gadolinium derivatives.  MEDICATIONS:  Current Outpatient Prescriptions  Medication Sig Dispense Refill  . albuterol (PROAIR HFA) 108 (90 BASE) MCG/ACT inhaler Inhale 2 puffs into the lungs every 6 (six) hours as needed for wheezing or shortness of breath.    . chlorthalidone (HYGROTON) 25 MG tablet Take 12.5 mg by mouth daily.     . cholecalciferol (VITAMIN D) 1000 UNITS tablet Take 1,000 Units by mouth daily.    . famotidine (PEPCID)  20 MG tablet One at bedtime 30 tablet 2  . Iron TABS Take 1 tablet by mouth daily.    Marland Kitchen losartan (COZAAR) 50 MG tablet Take 50 mg by mouth daily.     . tamoxifen (NOLVADEX) 20 MG tablet Take 1 tablet (20 mg total) by mouth daily. 90 tablet 1  . traMADol (ULTRAM) 50 MG tablet 1-2 every 4 hours as needed for cough or pain 40 tablet 0   No current facility-administered medications for this visit.    PHYSICAL EXAMINATION: ECOG PERFORMANCE STATUS: 0 - Asymptomatic  Filed Vitals:   03/18/15 0841  BP: 120/92  Pulse: 71  Temp: 98.8 F (37.1 C)  Resp: 18   Filed Weights   03/18/15 0841  Weight: 195 lb 14.4 oz (88.86 kg)    GENERAL:alert, no distress and comfortable SKIN: skin color, texture, turgor are normal, no rashes or significant lesions EYES: normal, Conjunctiva are pink and non-injected, sclera clear OROPHARYNX:no exudate, no erythema and lips, buccal mucosa, and tongue normal  NECK: supple, thyroid normal size, non-tender, without nodularity LYMPH:  no palpable lymphadenopathy in the cervical, axillary or inguinal LUNGS: clear to auscultation and percussion with normal breathing effort HEART: regular rate & rhythm and no murmurs and no lower extremity edema ABDOMEN:abdomen soft, non-tender and normal bowel sounds Musculoskeletal:no cyanosis of digits and no clubbing  NEURO: alert & oriented x 3 with fluent speech, no focal motor/sensory deficits BREAST: No palpable masses or nodules in either right or left breasts. No palpable axillary supraclavicular or infraclavicular adenopathy no breast tenderness or nipple discharge. (exam performed in the presence of a chaperone)  LABORATORY DATA:  I have reviewed the data as listed  Chemistry      Component Value Date/Time   NA 142 08/31/2014 0906   NA 140 01/12/2013 1430   K 4.1 08/31/2014 0906   K 4.1 01/12/2013 1430   CL 106 02/13/2013 1303   CL 101 01/12/2013 1430   CO2 28 08/31/2014 0906   CO2 29 01/12/2013 1430   BUN  9.4 08/31/2014 0906   BUN 9 01/12/2013 1430   CREATININE 0.8 08/31/2014 0906   CREATININE 0.75 01/12/2013 1430      Component Value Date/Time   CALCIUM 9.3 08/31/2014 0906   CALCIUM 10.0 01/12/2013 1430   ALKPHOS 50 08/31/2014 0906   AST 18 08/31/2014 0906   ALT 13 08/31/2014 0906   BILITOT 0.55 08/31/2014 0906       Lab Results  Component Value Date   WBC 7.5 03/18/2015   HGB 12.9 03/18/2015   HCT 39.5 03/18/2015   MCV 90.6 03/18/2015   PLT 245 03/18/2015   NEUTROABS 5.0 03/18/2015     RADIOGRAPHIC STUDIES: I have personally reviewed the radiology reports and agreed with their findings. Mammogram February 2016 is normal  ASSESSMENT & PLAN: Left breast DCIS treated with lumpectomy and adjuvant radiation therapy and currently on tamoxifen since June 2014.  Tamoxifen counseling: Patient is tolerating tamoxifen extremely well without any major problems or concerns. She has menopausal symptoms with occasional hot flashes.  Breast Cancer Surveillance: 1. Breast exam 03/18/2015: Normal 2. Mammogram 01/21/2015 No abnormalities. Postsurgical changes. Breast Density Category C. I recommended that she get 3-D mammograms for surveillance. Discussed the differences between different breast density categories.  Return to clinic once a year for follow-up   Survivorship: I strongly encouraged her to do more physical activity and exercise. She was very much willing to go back to doing exercise.   No orders of the defined types were placed in this encounter.   The patient has a good understanding of the overall plan. she agrees with it. She will call with any problems that may develop before her next visit here.   Rulon Eisenmenger, MD

## 2015-07-25 DIAGNOSIS — R195 Other fecal abnormalities: Secondary | ICD-10-CM | POA: Insufficient documentation

## 2015-08-14 ENCOUNTER — Other Ambulatory Visit: Payer: Self-pay

## 2015-08-14 DIAGNOSIS — D0592 Unspecified type of carcinoma in situ of left breast: Secondary | ICD-10-CM

## 2015-08-14 MED ORDER — TAMOXIFEN CITRATE 20 MG PO TABS: 20.0000 mg | ORAL_TABLET | Freq: Every day | ORAL | Status: DC

## 2015-08-25 ENCOUNTER — Emergency Department (HOSPITAL_BASED_OUTPATIENT_CLINIC_OR_DEPARTMENT_OTHER)
Admission: EM | Admit: 2015-08-25 | Discharge: 2015-08-25 | Disposition: A | Discharge status: Home / Self Care | Payer: BC Managed Care – PPO | Attending: Emergency Medicine | Admitting: Emergency Medicine

## 2015-08-25 ENCOUNTER — Emergency Department (HOSPITAL_BASED_OUTPATIENT_CLINIC_OR_DEPARTMENT_OTHER): Payer: BC Managed Care – PPO

## 2015-08-25 ENCOUNTER — Encounter (HOSPITAL_BASED_OUTPATIENT_CLINIC_OR_DEPARTMENT_OTHER): Payer: Self-pay | Admitting: Emergency Medicine

## 2015-08-25 DIAGNOSIS — F419 Anxiety disorder, unspecified: Secondary | ICD-10-CM | POA: Insufficient documentation

## 2015-08-25 DIAGNOSIS — I10 Essential (primary) hypertension: Secondary | ICD-10-CM | POA: Diagnosis not present

## 2015-08-25 DIAGNOSIS — Z87891 Personal history of nicotine dependence: Secondary | ICD-10-CM | POA: Diagnosis not present

## 2015-08-25 DIAGNOSIS — Z79899 Other long term (current) drug therapy: Secondary | ICD-10-CM | POA: Diagnosis not present

## 2015-08-25 DIAGNOSIS — Z853 Personal history of malignant neoplasm of breast: Secondary | ICD-10-CM | POA: Diagnosis not present

## 2015-08-25 DIAGNOSIS — R109 Unspecified abdominal pain: Secondary | ICD-10-CM | POA: Diagnosis present

## 2015-08-25 DIAGNOSIS — M898X8 Other specified disorders of bone, other site: Secondary | ICD-10-CM

## 2015-08-25 DIAGNOSIS — M533 Sacrococcygeal disorders, not elsewhere classified: Secondary | ICD-10-CM | POA: Insufficient documentation

## 2015-08-25 LAB — URINALYSIS, ROUTINE W REFLEX MICROSCOPIC
BILIRUBIN URINE: NEGATIVE
GLUCOSE, UA: NEGATIVE mg/dL
HGB URINE DIPSTICK: NEGATIVE
KETONES UR: NEGATIVE mg/dL
Nitrite: NEGATIVE
PROTEIN: NEGATIVE mg/dL
Specific Gravity, Urine: 1.016 (ref 1.005–1.030)
Urobilinogen, UA: 1 mg/dL (ref 0.0–1.0)
pH: 6 (ref 5.0–8.0)

## 2015-08-25 LAB — URINE MICROSCOPIC-ADD ON

## 2015-08-25 MED ORDER — TRAMADOL HCL 50 MG PO TABS: 50.0000 mg | ORAL_TABLET | Freq: Four times a day (QID) | ORAL | Status: DC | PRN

## 2015-08-25 NOTE — ED Notes (Signed)
Patient returned from radiology

## 2015-08-25 NOTE — ED Notes (Signed)
L sided flank pain x 1 week. Denies fevers, chills, nausea, vomiting diarrhea. States she noticed some odor to urine recently, but then had yeast infection that she treated at home. Rates pain 5/10.

## 2015-08-25 NOTE — ED Provider Notes (Signed)
CSN: [id number]     Arrival date & time 08/25/15  0400 History   First MD Initiated Contact with Patient 08/25/15 0534     Chief Complaint  Patient presents with  . Flank Pain     (Consider location/radiation/quality/duration/timing/severity/associated sxs/prior Treatment) HPI  This is a 49 year old female with a one-week history of pain in her left iliac crest. Pain is moderate, rating it about 5 out of 10. It is only somewhat worse with movement or palpation. She describes it as a dull pain. She denies injury. She denies fever, chills, nausea, vomiting, diarrhea, dysuria or hematuria. She did recently self treat a vaginal yeast infection.  Past Medical History  Diagnosis Date  . Anxiety   . Hypertension   . 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 S/P radiation therapy 03/06/2013-04/20/2013    Left Breast  /50.4Gy in 28 fractions/ Left Breast Boost / 10 Gy in 5 fractions  . Use of tamoxifen (Nolvadex)    Past Surgical History  Procedure Laterality Date  . Cesarean section       x 2  . Appendectomy    . Leg surgery Left 1994    vascular  . Partial mastectomy with needle localization Left 01/16/2013    Procedure: PARTIAL MASTECTOMY WITH NEEDLE LOCALIZATION;  Surgeon: Adin Hector, MD;  Location: [place];  Service: General;  Laterality: Left;  Left partial mastectomy with needle localization at breast center of [place]    Family History  Problem Relation Age of Onset  . Cancer Maternal Aunt     breast  . Cancer Father     ? colon   . Asthma Mother   . Allergies Mother   . Allergies Daughter    Social History  Substance Use Topics  . Smoking status: Former Smoker -- 0.50 packs/day for 9 years    Types: Cigarettes    Quit date: 11/23/1989  . Smokeless tobacco: Never Used  . Alcohol Use: No   OB History    Gravida Para Term Preterm AB TAB SAB  Ectopic Multiple Living   2 2              Obstetric Comments    menarche at 16, premenopause, G3P3 age first at first birth 9     Review of Systems  All other systems reviewed and are negative.   Allergies  Gadolinium derivatives  Home Medications   Prior to Admission medications   Medication Sig Start Date End Date Taking? Authorizing Provider  albuterol (PROAIR HFA) 108 (90 BASE) MCG/ACT inhaler Inhale 2 puffs into the lungs every 6 (six) hours as needed for wheezing or shortness of breath.    Historical Provider, MD  chlorthalidone (HYGROTON) 25 MG tablet Take 12.5 mg by mouth daily.     Historical Provider, MD  cholecalciferol (VITAMIN D) 1000 UNITS tablet Take 1,000 Units by mouth daily.    Historical Provider, MD  famotidine (PEPCID) 20 MG tablet One at bedtime 04/23/14   Tanda Rockers, MD  Iron TABS Take 1 tablet by mouth daily.    Historical Provider, MD  losartan (COZAAR) 50 MG tablet Take 50 mg by mouth daily.  12/24/13   Historical Provider, MD  tamoxifen (NOLVADEX) 20 MG tablet Take 1 tablet (20 mg total) by mouth daily. 08/14/15   Nicholas Lose, MD  traMADol (ULTRAM) 50 MG tablet 1-2  every 4 hours as needed for cough or pain 05/23/14   Tanda Rockers, MD   BP 128/94 mmHg  Pulse 83  Temp(Src) 98.3 F (36.8 C)  Resp 16  Ht 5\' 7"  (1.702 m)  Wt 200 lb (90.719 kg)  BMI 31.32 kg/m2  SpO2 100%   Physical Exam  General: Well-developed, well-nourished female in no acute distress; appearance consistent with age of record HENT: normocephalic; atraumatic Eyes: pupils equal, round and reactive to light; extraocular muscles intact; arcus senilis bilaterally  Neck: supple Heart: regular rate and rhythm Lungs: clear to auscultation bilaterally Abdomen: soft; nondistended; nontender; no masses or hepatosplenomegaly; bowel sounds present; mild tenderness over left iliac crest GU: No CVA tenderness  Extremities: No deformity; full range of motion; pulses normal Neurologic: Awake,  alert and oriented; motor function intact in all extremities and symmetric; no facial droop Skin: Warm and dry Psychiatric: Normal mood and affect    ED Course  Procedures (including critical care time)   MDM   Nursing notes and vitals signs, including pulse oximetry, reviewed.  Summary of this visit's results, reviewed by myself:  Labs:  Results for orders placed or performed during the hospital encounter of 08/25/15 (from the past 24 hour(s))  Urinalysis, Routine w reflex microscopic (not at [facility])     Status: Abnormal   Collection Time: 08/25/15  4:23 AM  Result Value Ref Range   Color, Urine YELLOW YELLOW   APPearance CLEAR CLEAR   Specific Gravity, Urine 1.016 1.005 - 1.030   pH 6.0 5.0 - 8.0   Glucose, UA NEGATIVE NEGATIVE mg/dL   Hgb urine dipstick NEGATIVE NEGATIVE   Bilirubin Urine NEGATIVE NEGATIVE   Ketones, ur NEGATIVE NEGATIVE mg/dL   Protein, ur NEGATIVE NEGATIVE mg/dL   Urobilinogen, UA 1.0 0.0 - 1.0 mg/dL   Nitrite NEGATIVE NEGATIVE   Leukocytes, UA TRACE (A) NEGATIVE  Urine microscopic-add on     Status: Abnormal   Collection Time: 08/25/15  4:23 AM  Result Value Ref Range   Squamous Epithelial / LPF FEW (A) RARE   WBC, UA 0-2 <3 WBC/hpf   RBC / HPF 0-2 <3 RBC/hpf   Bacteria, UA RARE RARE    Imaging Studies: Dg Pelvis 1-2 Views  08/25/2015   CLINICAL DATA:  LEFT iliac crest and LEFT flank pain for 1 week, no trauma.  EXAM: PELVIS - 1-2 VIEW  COMPARISON:  None.  FINDINGS: There is no evidence of pelvic fracture or diastasis. No pelvic bone lesions are seen.  IMPRESSION: Negative.   Electronically Signed   By: Elon Alas M.D.   On: 08/25/2015 06:33       Shanon Rosser, MD 08/25/15 [phone]

## 2015-08-25 NOTE — ED Notes (Signed)
Patient gone to radiology  

## 2015-12-23 ENCOUNTER — Other Ambulatory Visit: Payer: Self-pay

## 2015-12-23 ENCOUNTER — Other Ambulatory Visit: Payer: Self-pay | Admitting: Hematology and Oncology

## 2015-12-23 DIAGNOSIS — Z853 Personal history of malignant neoplasm of breast: Secondary | ICD-10-CM

## 2016-01-24 ENCOUNTER — Ambulatory Visit
Admission: RE | Admit: 2016-01-24 | Discharge: 2016-01-24 | Disposition: A | Discharge status: Home / Self Care | Payer: BC Managed Care – PPO | Source: Ambulatory Visit | Attending: Hematology and Oncology | Admitting: Hematology and Oncology

## 2016-01-24 DIAGNOSIS — Z853 Personal history of malignant neoplasm of breast: Secondary | ICD-10-CM

## 2016-01-30 ENCOUNTER — Other Ambulatory Visit: Payer: Self-pay | Admitting: Obstetrics and Gynecology

## 2016-02-11 NOTE — Patient Instructions (Addendum)
Your procedure is scheduled on:  Tuesday, February 18, 2016  Enter through the Main Entrance of [facility] at:  11:30 AM  Pick up the phone at the desk and dial [phone].  Call this number if you have problems the morning of surgery: [phone].  Remember:  Do NOT eat food:  After Midnight Monday  Do NOT drink clear liquids after:  9:00 AM day of surgery  Take these medicines the morning of surgery with a SIP OF WATER:  None  Bring Asthma Inhaler day of surgery  Do NOT wear jewelry (body piercing), metal hair clips/bobby pins, make-up, or nail polish. Do NOT wear lotions, powders, or perfumes.  You may wear deodorant. Do NOT shave for 48 hours prior to surgery. Do NOT bring valuables to the hospital. Contacts, dentures, or bridgework may not be worn into surgery.  Have a responsible adult drive you home and stay with you for 24 hours after your procedure

## 2016-02-13 ENCOUNTER — Other Ambulatory Visit: Payer: Self-pay

## 2016-02-13 ENCOUNTER — Encounter (HOSPITAL_COMMUNITY)
Admission: RE | Admit: 2016-02-13 | Discharge: 2016-02-13 | Disposition: A | Discharge status: Home / Self Care | Payer: BC Managed Care – PPO | Source: Ambulatory Visit | Attending: Obstetrics and Gynecology | Admitting: Obstetrics and Gynecology

## 2016-02-13 ENCOUNTER — Encounter (HOSPITAL_COMMUNITY): Payer: Self-pay

## 2016-02-13 DIAGNOSIS — Z01812 Encounter for preprocedural laboratory examination: Secondary | ICD-10-CM | POA: Diagnosis not present

## 2016-02-13 DIAGNOSIS — Z01810 Encounter for preprocedural cardiovascular examination: Secondary | ICD-10-CM | POA: Insufficient documentation

## 2016-02-13 HISTORY — DX: Unspecified asthma, uncomplicated: J45.909

## 2016-02-13 HISTORY — DX: Benign neoplasm of connective and other soft tissue, unspecified: D21.9

## 2016-02-13 HISTORY — DX: Peripheral vascular disease, unspecified: I73.9

## 2016-02-13 HISTORY — DX: Gastro-esophageal reflux disease without esophagitis: K21.9

## 2016-02-13 LAB — CBC
HCT: 41.3 % (ref 36.0–46.0)
Hemoglobin: 13.5 g/dL (ref 12.0–15.0)
MCH: 29.1 pg (ref 26.0–34.0)
MCHC: 32.7 g/dL (ref 30.0–36.0)
MCV: 89 fL (ref 78.0–100.0)
Platelets: 297 10*3/uL (ref 150–400)
RBC: 4.64 MIL/uL (ref 3.87–5.11)
RDW: 12.6 % (ref 11.5–15.5)
WBC: 7.7 10*3/uL (ref 4.0–10.5)

## 2016-02-13 LAB — BASIC METABOLIC PANEL
ANION GAP: 8 (ref 5–15)
BUN: 14 mg/dL (ref 6–20)
CALCIUM: 9.4 mg/dL (ref 8.9–10.3)
CO2: 29 mmol/L (ref 22–32)
Chloride: 105 mmol/L (ref 101–111)
Creatinine, Ser: 0.65 mg/dL (ref 0.44–1.00)
Glucose, Bld: 92 mg/dL (ref 65–99)
Potassium: 3.7 mmol/L (ref 3.5–5.1)
SODIUM: 142 mmol/L (ref 135–145)

## 2016-02-17 ENCOUNTER — Ambulatory Visit (HOSPITAL_COMMUNITY)
Admission: RE | Admit: 2016-02-17 | Discharge: 2016-02-17 | Disposition: A | Discharge status: Home / Self Care | Payer: BC Managed Care – PPO | Source: Ambulatory Visit | Attending: Obstetrics and Gynecology | Admitting: Obstetrics and Gynecology

## 2016-02-17 ENCOUNTER — Other Ambulatory Visit (HOSPITAL_COMMUNITY): Payer: Self-pay | Admitting: Obstetrics and Gynecology

## 2016-02-17 DIAGNOSIS — F419 Anxiety disorder, unspecified: Secondary | ICD-10-CM | POA: Insufficient documentation

## 2016-02-17 DIAGNOSIS — M7989 Other specified soft tissue disorders: Principal | ICD-10-CM

## 2016-02-17 DIAGNOSIS — M79662 Pain in left lower leg: Secondary | ICD-10-CM

## 2016-02-17 DIAGNOSIS — Z9289 Personal history of other medical treatment: Secondary | ICD-10-CM

## 2016-02-17 DIAGNOSIS — I10 Essential (primary) hypertension: Secondary | ICD-10-CM | POA: Insufficient documentation

## 2016-02-17 DIAGNOSIS — K219 Gastro-esophageal reflux disease without esophagitis: Secondary | ICD-10-CM | POA: Diagnosis not present

## 2016-02-17 DIAGNOSIS — M79605 Pain in left leg: Secondary | ICD-10-CM

## 2016-02-17 DIAGNOSIS — I739 Peripheral vascular disease, unspecified: Secondary | ICD-10-CM | POA: Diagnosis not present

## 2016-02-17 HISTORY — DX: Personal history of other medical treatment: Z92.89

## 2016-02-17 NOTE — Progress Notes (Signed)
VASCULAR LAB PRELIMINARY  PRELIMINARY  PRELIMINARY  PRELIMINARY  Left lower extremity venous duplex completed.    Preliminary report:  Left:  No evidence of DVT, superficial thrombosis, or Baker's cyst.  Jarett Dralle, RVS 02/17/2016, 7:11 PM

## 2016-02-18 ENCOUNTER — Other Ambulatory Visit: Payer: Self-pay | Admitting: Obstetrics and Gynecology

## 2016-02-18 ENCOUNTER — Ambulatory Visit (HOSPITAL_COMMUNITY): Payer: BC Managed Care – PPO | Admitting: Anesthesiology

## 2016-02-18 ENCOUNTER — Encounter (HOSPITAL_COMMUNITY)
Admission: RE | Disposition: A | Discharge status: Home / Self Care | Payer: Self-pay | Source: Ambulatory Visit | Attending: Obstetrics and Gynecology

## 2016-02-18 ENCOUNTER — Encounter (HOSPITAL_COMMUNITY): Payer: Self-pay | Admitting: Anesthesiology

## 2016-02-18 ENCOUNTER — Ambulatory Visit (HOSPITAL_COMMUNITY)
Admission: RE | Admit: 2016-02-18 | Discharge: 2016-02-18 | Disposition: A | Discharge status: Home / Self Care | Payer: BC Managed Care – PPO | Source: Ambulatory Visit | Attending: Obstetrics and Gynecology | Admitting: Obstetrics and Gynecology

## 2016-02-18 DIAGNOSIS — N840 Polyp of corpus uteri: Secondary | ICD-10-CM | POA: Insufficient documentation

## 2016-02-18 DIAGNOSIS — R938 Abnormal findings on diagnostic imaging of other specified body structures: Secondary | ICD-10-CM | POA: Insufficient documentation

## 2016-02-18 DIAGNOSIS — Z87891 Personal history of nicotine dependence: Secondary | ICD-10-CM | POA: Diagnosis not present

## 2016-02-18 DIAGNOSIS — I10 Essential (primary) hypertension: Secondary | ICD-10-CM | POA: Insufficient documentation

## 2016-02-18 DIAGNOSIS — R9389 Abnormal findings on diagnostic imaging of other specified body structures: Secondary | ICD-10-CM

## 2016-02-18 DIAGNOSIS — K219 Gastro-esophageal reflux disease without esophagitis: Secondary | ICD-10-CM | POA: Diagnosis not present

## 2016-02-18 DIAGNOSIS — Z853 Personal history of malignant neoplasm of breast: Secondary | ICD-10-CM | POA: Insufficient documentation

## 2016-02-18 HISTORY — PX: DILATATION & CURETTAGE/HYSTEROSCOPY WITH MYOSURE: SHX6511

## 2016-02-18 HISTORY — DX: Personal history of other medical treatment: Z92.89

## 2016-02-18 SURGERY — DILATATION & CURETTAGE/HYSTEROSCOPY WITH MYOSURE
Anesthesia: General

## 2016-02-18 MED ORDER — PROPOFOL 10 MG/ML IV BOLUS
INTRAVENOUS | Status: DC | PRN
  Administered 2016-02-18: 150 mg via INTRAVENOUS

## 2016-02-18 MED ORDER — FENTANYL CITRATE (PF) 250 MCG/5ML IJ SOLN
INTRAMUSCULAR | Status: DC | PRN
  Administered 2016-02-18: 25 ug via INTRAVENOUS
  Administered 2016-02-18: 100 ug via INTRAVENOUS
  Administered 2016-02-18: 50 ug via INTRAVENOUS
  Administered 2016-02-18: 25 ug via INTRAVENOUS

## 2016-02-18 MED ORDER — DEXAMETHASONE SODIUM PHOSPHATE 10 MG/ML IJ SOLN
INTRAMUSCULAR | Status: AC
  Filled 2016-02-18: qty 1

## 2016-02-18 MED ORDER — GLYCOPYRROLATE 0.2 MG/ML IJ SOLN
INTRAMUSCULAR | Status: DC | PRN
  Administered 2016-02-18: 0.1 mg via INTRAVENOUS

## 2016-02-18 MED ORDER — MIDAZOLAM HCL 2 MG/2ML IJ SOLN
INTRAMUSCULAR | Status: AC
  Filled 2016-02-18: qty 2

## 2016-02-18 MED ORDER — FENTANYL CITRATE (PF) 100 MCG/2ML IJ SOLN
INTRAMUSCULAR | Status: AC
  Filled 2016-02-18: qty 4

## 2016-02-18 MED ORDER — PROPOFOL 10 MG/ML IV BOLUS
INTRAVENOUS | Status: AC
  Filled 2016-02-18: qty 20

## 2016-02-18 MED ORDER — FENTANYL CITRATE (PF) 100 MCG/2ML IJ SOLN: 25.0000 ug | INTRAMUSCULAR | Status: DC | PRN

## 2016-02-18 MED ORDER — IBUPROFEN 200 MG PO TABS
200.0000 mg | ORAL_TABLET | Freq: Four times a day (QID) | ORAL | Status: DC | PRN
  Filled 2016-02-18: qty 2

## 2016-02-18 MED ORDER — ONDANSETRON HCL 4 MG/2ML IJ SOLN
INTRAMUSCULAR | Status: AC
  Filled 2016-02-18: qty 2

## 2016-02-18 MED ORDER — MIDAZOLAM HCL 2 MG/2ML IJ SOLN
INTRAMUSCULAR | Status: DC | PRN
  Administered 2016-02-18: 2 mg via INTRAVENOUS

## 2016-02-18 MED ORDER — LIDOCAINE HCL (CARDIAC) 20 MG/ML IV SOLN
INTRAVENOUS | Status: AC
  Filled 2016-02-18: qty 5

## 2016-02-18 MED ORDER — SCOPOLAMINE 1 MG/3DAYS TD PT72
MEDICATED_PATCH | TRANSDERMAL | Status: AC
  Administered 2016-02-18: 1.5 mg via TRANSDERMAL
  Filled 2016-02-18: qty 1

## 2016-02-18 MED ORDER — PHENYLEPHRINE 40 MCG/ML (10ML) SYRINGE FOR IV PUSH (FOR BLOOD PRESSURE SUPPORT)
PREFILLED_SYRINGE | INTRAVENOUS | Status: AC
Start: 2016-02-18 — End: 2016-02-18
  Filled 2016-02-18: qty 10

## 2016-02-18 MED ORDER — MEPERIDINE HCL 25 MG/ML IJ SOLN: 6.2500 mg | INTRAMUSCULAR | Status: DC | PRN

## 2016-02-18 MED ORDER — LACTATED RINGERS IV SOLN
INTRAVENOUS | Status: DC
  Administered 2016-02-18 (×2): via INTRAVENOUS

## 2016-02-18 MED ORDER — ONDANSETRON HCL 4 MG/2ML IJ SOLN
INTRAMUSCULAR | Status: DC | PRN
  Administered 2016-02-18: 4 mg via INTRAVENOUS

## 2016-02-18 MED ORDER — IBUPROFEN 100 MG/5ML PO SUSP
200.0000 mg | Freq: Four times a day (QID) | ORAL | Status: DC | PRN
  Filled 2016-02-18: qty 20

## 2016-02-18 MED ORDER — ONDANSETRON HCL 4 MG/2ML IJ SOLN: 4.0000 mg | Freq: Once | INTRAMUSCULAR | Status: DC | PRN

## 2016-02-18 MED ORDER — KETOROLAC TROMETHAMINE 30 MG/ML IJ SOLN: 30.0000 mg | Freq: Once | INTRAMUSCULAR | Status: DC

## 2016-02-18 MED ORDER — KETOROLAC TROMETHAMINE 30 MG/ML IJ SOLN
INTRAMUSCULAR | Status: AC
  Filled 2016-02-18: qty 1

## 2016-02-18 MED ORDER — DEXAMETHASONE SODIUM PHOSPHATE 4 MG/ML IJ SOLN
INTRAMUSCULAR | Status: DC | PRN
  Administered 2016-02-18: 10 mg via INTRAVENOUS

## 2016-02-18 MED ORDER — SCOPOLAMINE 1 MG/3DAYS TD PT72
1.0000 | MEDICATED_PATCH | Freq: Once | TRANSDERMAL | Status: DC
Start: 2016-02-18 — End: 2016-02-18
  Administered 2016-02-18: 1.5 mg via TRANSDERMAL

## 2016-02-18 MED ORDER — KETOROLAC TROMETHAMINE 30 MG/ML IJ SOLN
INTRAMUSCULAR | Status: DC | PRN
  Administered 2016-02-18: 30 mg via INTRAMUSCULAR
  Administered 2016-02-18: 30 mg via INTRAVENOUS

## 2016-02-18 MED ORDER — HYDROCODONE-ACETAMINOPHEN 7.5-325 MG PO TABS
1.0000 | ORAL_TABLET | Freq: Once | ORAL | Status: AC | PRN
  Administered 2016-02-18: 1 via ORAL

## 2016-02-18 MED ORDER — PHENYLEPHRINE HCL 10 MG/ML IJ SOLN
INTRAMUSCULAR | Status: DC | PRN
  Administered 2016-02-18: 40 ug via INTRAVENOUS
  Administered 2016-02-18: 80 ug via INTRAVENOUS

## 2016-02-18 MED ORDER — HYDROCODONE-ACETAMINOPHEN 7.5-325 MG PO TABS
ORAL_TABLET | ORAL | Status: AC
  Administered 2016-02-18: 1 via ORAL
  Filled 2016-02-18: qty 1

## 2016-02-18 MED ORDER — KETOROLAC TROMETHAMINE 30 MG/ML IJ SOLN
INTRAMUSCULAR | Status: AC
Start: 2016-02-18 — End: 2016-02-18
  Filled 2016-02-18: qty 1

## 2016-02-18 MED ORDER — LIDOCAINE HCL (CARDIAC) 20 MG/ML IV SOLN
INTRAVENOUS | Status: DC | PRN
  Administered 2016-02-18: 100 mg via INTRAVENOUS

## 2016-02-18 SURGICAL SUPPLY — 21 items
CANISTER SUCT 3000ML (MISCELLANEOUS) ×3 IMPLANT
CATH ROBINSON RED A/P 16FR (CATHETERS) ×3 IMPLANT
CLOTH BEACON ORANGE TIMEOUT ST (SAFETY) ×3 IMPLANT
CONTAINER PREFILL 10% NBF 60ML (FORM) ×6 IMPLANT
DEVICE MYOSURE LITE (MISCELLANEOUS) IMPLANT
DEVICE MYOSURE REACH (MISCELLANEOUS) ×2 IMPLANT
DILATOR CANAL MILEX (MISCELLANEOUS) ×2 IMPLANT
ELECT REM PT RETURN 9FT ADLT (ELECTROSURGICAL) ×3
ELECTRODE REM PT RTRN 9FT ADLT (ELECTROSURGICAL) ×1 IMPLANT
FILTER ARTHROSCOPY CONVERTOR (FILTER) ×3 IMPLANT
GLOVE BIOGEL PI IND STRL 7.0 (GLOVE) ×3 IMPLANT
GLOVE BIOGEL PI INDICATOR 7.0 (GLOVE) ×6
GLOVE ECLIPSE 6.5 STRL STRAW (GLOVE) ×3 IMPLANT
GOWN STRL REUS W/TWL LRG LVL3 (GOWN DISPOSABLE) ×6 IMPLANT
PACK VAGINAL MINOR WOMEN LF (CUSTOM PROCEDURE TRAY) ×3 IMPLANT
PAD OB MATERNITY 4.3X12.25 (PERSONAL CARE ITEMS) ×3 IMPLANT
SEAL ROD LENS SCOPE MYOSURE (ABLATOR) ×3 IMPLANT
TOWEL OR 17X24 6PK STRL BLUE (TOWEL DISPOSABLE) ×6 IMPLANT
TUBING AQUILEX INFLOW (TUBING) ×3 IMPLANT
TUBING AQUILEX OUTFLOW (TUBING) ×3 IMPLANT
WATER STERILE IRR 1000ML POUR (IV SOLUTION) ×3 IMPLANT

## 2016-02-18 NOTE — Anesthesia Procedure Notes (Signed)
Procedure Name: LMA Insertion Date/Time: 02/18/2016 1:16 PM Performed by: Flossie Dibble Pre-anesthesia Checklist: Patient identified, Patient being monitored, Timeout performed, Emergency Drugs available and Suction available Patient Re-evaluated:Patient Re-evaluated prior to inductionOxygen Delivery Method: Circle system utilized Preoxygenation: Pre-oxygenation with 100% oxygen Intubation Type: IV induction Ventilation: Mask ventilation without difficulty LMA: LMA inserted LMA Size: 4.0 Number of attempts: 1 Placement Confirmation: breath sounds checked- equal and bilateral and positive ETCO2 Tube secured with: Tape Dental Injury: Teeth and Oropharynx as per pre-operative assessment

## 2016-02-18 NOTE — Anesthesia Preprocedure Evaluation (Addendum)
Anesthesia Evaluation  Patient identified by MRN, date of birth, ID band Patient awake    Reviewed: Allergy & Precautions, H&P , NPO status , Patient's Chart, lab work & pertinent test results, reviewed documented beta blocker date and time   Airway Mallampati: I  TM Distance: >3 FB Neck ROM: full    Dental no notable dental hx. (+) Teeth Intact   Pulmonary former smoker,    Pulmonary exam normal        Cardiovascular hypertension, Pt. on medications and Pt. on home beta blockers Normal cardiovascular exam     Neuro/Psych negative neurological ROS     GI/Hepatic Neg liver ROS, GERD  ,  Endo/Other  negative endocrine ROS  Renal/GU negative Renal ROS     Musculoskeletal   Abdominal (+) + obese,   Peds  Hematology negative hematology ROS (+)   Anesthesia Other Findings   Reproductive/Obstetrics negative OB ROS                             Anesthesia Physical Anesthesia Plan  ASA: II  Anesthesia Plan: General   Post-op Pain Management:    Induction: Intravenous  Airway Management Planned: LMA  Additional Equipment:   Intra-op Plan:   Post-operative Plan:   Informed Consent: I have reviewed the patients History and Physical, chart, labs and discussed the procedure including the risks, benefits and alternatives for the proposed anesthesia with the patient or authorized representative who has indicated his/her understanding and acceptance.     Plan Discussed with: CRNA and Surgeon  Anesthesia Plan Comments:         Anesthesia Quick Evaluation

## 2016-02-18 NOTE — Discharge Instructions (Signed)
CALL  IF TEMP>100.4, NOTHING PER VAGINA X 1 WK, CALL IF SOAKING A MAXI  PAD EVERY HOUR OR MORE FREQUENTLY 

## 2016-02-18 NOTE — Brief Op Note (Signed)
02/18/2016  2:06 PM  PATIENT:  Tracy Perez  50 y.o. female  PRE-OPERATIVE DIAGNOSIS:  Endometrial Thickening on Ultrasound, Endometrial Mass, History of Breast Cancer  POST-OPERATIVE DIAGNOSIS:  Endometrial Thickening on Ultrasound, Endometrial Mass  PROCEDURE:  Diagnostic hysteroscopy, hysteroscopic resection of endometrial polyp, dilation and curettage  SURGEON:  Surgeon(s) and Role:    * Servando Salina, MD - Primary  PHYSICIAN ASSISTANT:   ASSISTANTS: none   ANESTHESIA:   general Findings: large polypoid mass arising from left lateral wall and extend into the endocervical canal. Sclerosed tubal ostias EBL:  Total I/O In: 1600 [I.V.:1600] Out: 110 [Urine:100; Blood:10]  BLOOD ADMINISTERED:none  DRAINS: none   LOCAL MEDICATIONS USED:  NONE  SPECIMEN:  Source of Specimen:  emc, endometrial polyp  DISPOSITION OF SPECIMEN:  PATHOLOGY  COUNTS:  YES  TOURNIQUET:  * No tourniquets in log *  DICTATION: .Other Dictation: Dictation Number [id number]  PLAN OF CARE: Discharge to home after PACU  PATIENT DISPOSITION:  PACU - hemodynamically stable.   Delay start of Pharmacological VTE agent (>24hrs) due to surgical blood loss or risk of bleeding: no

## 2016-02-18 NOTE — Transfer of Care (Signed)
Immediate Anesthesia Transfer of Care Note  Patient: Tracy Perez  Procedure(s) Performed: Procedure(s): DILATATION & CURETTAGE/HYSTEROSCOPY WITH MYOSURE (N/A)  Patient Location: PACU  Anesthesia Type:General  Level of Consciousness: awake, alert  and oriented  Airway & Oxygen Therapy: Patient Spontanous Breathing and Patient connected to nasal cannula oxygen  Post-op Assessment: Report given to RN and Post -op Vital signs reviewed and stable  Post vital signs: Reviewed and stable  Last Vitals:  Filed Vitals:   02/18/16 1132  BP: 132/96  Pulse: 89  Temp: 37.2 C  Resp: 18    Complications: No apparent anesthesia complications

## 2016-02-18 NOTE — Anesthesia Postprocedure Evaluation (Signed)
Anesthesia Post Note  Patient: Tracy Perez  Procedure(s) Performed: Procedure(s) (LRB): DILATATION & CURETTAGE/HYSTEROSCOPY WITH MYOSURE (N/A)  Patient location during evaluation: PACU Anesthesia Type: General Level of consciousness: awake Pain management: pain level controlled Vital Signs Assessment: post-procedure vital signs reviewed and stable Respiratory status: spontaneous breathing Cardiovascular status: stable Postop Assessment: no signs of nausea or vomiting Anesthetic complications: no    Last Vitals:  Filed Vitals:   02/18/16 1430 02/18/16 1445  BP: 124/81 126/77  Pulse: 79 85  Temp:    Resp: 17 17    Last Pain:  Filed Vitals:   02/18/16 1452  PainSc: [place]

## 2016-02-19 ENCOUNTER — Encounter (HOSPITAL_COMMUNITY): Payer: Self-pay | Admitting: Obstetrics and Gynecology

## 2016-02-19 NOTE — Op Note (Signed)
[name], Tracy Perez           ACCOUNT NO.:  [id number]  MEDICAL RECORD NO.:  [id number]  LOCATION:                                 FACILITY:  PHYSICIAN:  Servando Salina, M.D.DATE OF BIRTH:  [birth year]  DATE OF PROCEDURE:  02/18/2016 DATE OF DISCHARGE:                              OPERATIVE REPORT   PREOPERATIVE DIAGNOSIS:  Endometrial thickening on sonogram, endometrial mass, history of breast cancer.  PROCEDURE:  Diagnostic hysteroscopy, hysteroscopic resection of endometrial mass using myosure, dilation and curettage  POSTOPERATIVE DIAGNOSES:  Endometrial polyp, endometrial thickening on ultrasound, history of breast cancer.  ANESTHESIA:  General.  SURGEON:  Servando Salina, M.D.  ASSISTANT:  None.  DESCRIPTION OF PROCEDURE:  Under adequate general anesthesia, the patient was placed in the dorsal lithotomy position.  She was sterilely prepped and draped in usual fashion.  Bladder was catheterized for large amount of urine.  Examination under anesthesia revealed an anteflexed uterus.  No adnexal masses could be appreciated.  A bivalve speculum placed in the vagina.  Single-tooth tenaculum was placed on the anterior lip of the cervix.  The cervix was dilated up to ultimately #25 [facility] dilator, and the MyoSure resectoscope apparatus was introduced into the uterine cavity.  At which time, a large polypoid mass arising from the left lateral wall and extending into the endocervical canal was noted. The distal end of it was bruised.  Replacing the internal sheath with the long resectoscope, the mass was resected all the way up to its finding on the lateral wall.  At that point, the resection was continued until the base was flushed to the endometrial wall.  Tubal ostia were not seen, appeared to be sclerosed, the cavity was otherwise without any other lesions.  The MyoSure resectoscope was then removed. The cavity was then curetted.  Subsequently, all instruments  were then removed from the vagina.  SPECIMEN:  Labeled endometrial polyp and endometrial curetting was sent to Pathology.  ESTIMATED BLOOD LOSS:  Less than 10 mL.  FLUIDS:  500 mL.  URINE OUTPUT:  100 mL.  FLUID DEFICIT:  Was 600 mL.  COUNTS:  Sponge and instrument counts x2 were correct.  COMPLICATION:  None.  The patient tolerated the procedure well and was transferred to recovery in stable condition.     Servando Salina, M.D.     [place]/MEDQ  D:  02/18/2016  T:  02/19/2016  Job:  [id number]

## 2016-02-21 DIAGNOSIS — Z1211 Encounter for screening for malignant neoplasm of colon: Secondary | ICD-10-CM | POA: Insufficient documentation

## 2016-02-26 ENCOUNTER — Encounter: Payer: Self-pay | Admitting: Internal Medicine

## 2016-03-19 ENCOUNTER — Ambulatory Visit: Payer: BC Managed Care – PPO | Admitting: Hematology and Oncology

## 2016-04-10 ENCOUNTER — Telehealth: Payer: Self-pay | Admitting: Hematology and Oncology

## 2016-04-10 ENCOUNTER — Ambulatory Visit (HOSPITAL_BASED_OUTPATIENT_CLINIC_OR_DEPARTMENT_OTHER): Payer: BC Managed Care – PPO | Admitting: Hematology and Oncology

## 2016-04-10 ENCOUNTER — Encounter: Payer: Self-pay | Admitting: Hematology and Oncology

## 2016-04-10 VITALS — BP 134/83 | HR 81 | Temp 100.5°F | Resp 18 | Wt 202.1 lb

## 2016-04-10 DIAGNOSIS — C50112 Malignant neoplasm of central portion of left female breast: Secondary | ICD-10-CM

## 2016-04-10 DIAGNOSIS — D0512 Intraductal carcinoma in situ of left breast: Secondary | ICD-10-CM | POA: Diagnosis not present

## 2016-04-10 DIAGNOSIS — D0592 Unspecified type of carcinoma in situ of left breast: Secondary | ICD-10-CM

## 2016-04-10 MED ORDER — TAMOXIFEN CITRATE 20 MG PO TABS: 20.0000 mg | ORAL_TABLET | Freq: Every day | ORAL | Status: DC

## 2016-04-10 NOTE — Telephone Encounter (Signed)
appt made and avs printed °

## 2016-04-10 NOTE — Assessment & Plan Note (Signed)
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  Tamoxifen counseling: Patient is tolerating tamoxifen extremely well without any major problems or concerns. She has menopausal symptoms with occasional hot flashes.  Breast Cancer Surveillance: 1. Breast exam 04/10/2016: Normal 2. Mammogram 01/24/2016 No abnormalities. Postsurgical changes. Breast Density Category C. I recommended that she get 3-D mammograms for surveillance. Discussed the differences between different breast density categories.  Return to clinic once a year for follow-up

## 2016-04-10 NOTE — Progress Notes (Signed)
Patient Care Team: Berkley Harvey, NP as PCP - General (Nurse Practitioner)  SUMMARY OF ONCOLOGIC HISTORY:   Cancer of central portion of left female breast ([place])   01/16/2013 Initial Diagnosis Left lumpectomy: DCIS with necrosis, grade 2, ER 100%, PR 85%, Tis N0 stage 0   03/06/2013 - 04/20/2013 Radiation Therapy Adjuvant radiation therapy   05/11/2013 -  Anti-estrogen oral therapy Tamoxifen 20 mg daily    CHIEF COMPLIANT: Follow-up on tamoxifen  INTERVAL HISTORY: Tracy Perez is a 50 year old with above-mentioned history of left breast DCIS underwent lumpectomy and radiation is currently on tamoxifen. She appears to be tolerating tamoxifen fairly well. She denies any major problems with hot flashes. She had a uterine D&C performed which did not show any abnormalities. She denies any musculoskeletal aches or pains. Denies any lumps or nodules in the breasts.  REVIEW OF SYSTEMS:   Constitutional: Denies fevers, chills or abnormal weight loss Eyes: Denies blurriness of vision Ears, nose, mouth, throat, and face: Denies mucositis or sore throat Respiratory: Denies cough, dyspnea or wheezes Cardiovascular: Denies palpitation, chest discomfort Gastrointestinal:  Denies nausea, heartburn or change in bowel habits Skin: Denies abnormal skin rashes Lymphatics: Denies new lymphadenopathy or easy bruising Neurological:Denies numbness, tingling or new weaknesses Behavioral/Psych: Mood is stable, no new changes  Extremities: No lower extremity edema Breast:  denies any pain or lumps or nodules in either breasts All other systems were reviewed with the patient and are negative.  I have reviewed the past medical history, past surgical history, social history and family history with the patient and they are unchanged from previous note.  ALLERGIES:  is allergic to gadolinium derivatives.  MEDICATIONS:  Current Outpatient Prescriptions  Medication Sig Dispense Refill  . albuterol (PROAIR  HFA) 108 (90 BASE) MCG/ACT inhaler Inhale 2 puffs into the lungs every 6 (six) hours as needed for wheezing or shortness of breath.    . chlorthalidone (HYGROTON) 25 MG tablet Take 12.5 mg by mouth daily.     . cholecalciferol (VITAMIN D) 1000 UNITS tablet Take 1,000 Units by mouth daily.    . Iron TABS Take 1 tablet by mouth daily.    Marland Kitchen losartan (COZAAR) 50 MG tablet Take 50 mg by mouth daily.     . naproxen sodium (ANAPROX) 220 MG tablet Take 220 mg by mouth 2 (two) times daily with a meal.    . tamoxifen (NOLVADEX) 20 MG tablet Take 1 tablet (20 mg total) by mouth daily. 90 tablet 1   No current facility-administered medications for this visit.    PHYSICAL EXAMINATION: ECOG PERFORMANCE STATUS: 0 - Asymptomatic  There were no vitals filed for this visit. There were no vitals filed for this visit.  GENERAL:alert, no distress and comfortable SKIN: skin color, texture, turgor are normal, no rashes or significant lesions EYES: normal, Conjunctiva are pink and non-injected, sclera clear OROPHARYNX:no exudate, no erythema and lips, buccal mucosa, and tongue normal  NECK: supple, thyroid normal size, non-tender, without nodularity LYMPH:  no palpable lymphadenopathy in the cervical, axillary or inguinal LUNGS: clear to auscultation and percussion with normal breathing effort HEART: regular rate & rhythm and no murmurs and no lower extremity edema ABDOMEN:abdomen soft, non-tender and normal bowel sounds MUSCULOSKELETAL:no cyanosis of digits and no clubbing  NEURO: alert & oriented x 3 with fluent speech, no focal motor/sensory deficits EXTREMITIES: No lower extremity edema BREAST: No palpable masses or nodules in either right or left breasts. No palpable axillary supraclavicular or infraclavicular adenopathy no  breast tenderness or nipple discharge. (exam performed in the presence of a chaperone)  LABORATORY DATA:  I have reviewed the data as listed   Chemistry      Component Value  Date/Time   NA 142 02/13/2016 0910   NA 141 03/18/2015 0823   K 3.7 02/13/2016 0910   K 3.8 03/18/2015 0823   CL 105 02/13/2016 0910   CL 106 02/13/2013 1303   CO2 29 02/13/2016 0910   CO2 23 03/18/2015 0823   BUN 14 02/13/2016 0910   BUN 8.8 03/18/2015 0823   CREATININE 0.65 02/13/2016 0910   CREATININE 0.7 03/18/2015 0823      Component Value Date/Time   CALCIUM 9.4 02/13/2016 0910   CALCIUM 8.7 03/18/2015 0823   ALKPHOS 54 03/18/2015 0823   AST 31 03/18/2015 0823   ALT 38 03/18/2015 0823   BILITOT 0.45 03/18/2015 0823       Lab Results  Component Value Date   WBC 7.7 02/13/2016   HGB 13.5 02/13/2016   HCT 41.3 02/13/2016   MCV 89.0 02/13/2016   PLT 297 02/13/2016   NEUTROABS 5.0 03/18/2015     ASSESSMENT & PLAN:  Cancer of central portion of left female breast 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  Tamoxifen counseling: Patient is tolerating tamoxifen extremely well without any major problems or concerns. She has menopausal symptoms with occasional hot flashes.  Breast Cancer Surveillance: 1. Breast exam 04/10/2016: Normal 2. Mammogram 01/24/2016 No abnormalities. Postsurgical changes. Breast Density Category C. I recommended that she get 3-D mammograms for surveillance. Discussed the differences between different breast density categories.  I discussed importance of exercise in decreasing the risk of breast cancer. Return to clinic once a year for follow-up  No orders of the defined types were placed in this encounter.   The patient has a good understanding of the overall plan. she agrees with it. she will call with any problems that may develop before the next visit here.   Rulon Eisenmenger, MD 04/10/2016

## 2016-04-10 NOTE — Progress Notes (Signed)
Vitals obtained by tech.  Chart already closed.  Addendum made for vital entry by this RN.

## 2016-04-14 ENCOUNTER — Ambulatory Visit (AMBULATORY_SURGERY_CENTER): Payer: Self-pay | Admitting: *Deleted

## 2016-04-14 VITALS — Ht 67.0 in | Wt 210.0 lb

## 2016-04-14 DIAGNOSIS — Z1211 Encounter for screening for malignant neoplasm of colon: Secondary | ICD-10-CM

## 2016-04-14 MED ORDER — NA SULFATE-K SULFATE-MG SULF 17.5-3.13-1.6 GM/177ML PO SOLN: 1.0000 | Freq: Once | ORAL | Status: DC

## 2016-04-14 NOTE — Progress Notes (Signed)
No egg or soy allergy known to patient  No issues with past sedation with any surgeries  or procedures, no intubation problems  No diet pills per patient No home 02 use per patient  No blood thinners per patient  Pt denies issues with constipation  No bp/ stick left side from breast cancer and partial mastectomy emmi to e mail

## 2016-04-16 ENCOUNTER — Encounter: Payer: Self-pay | Admitting: Internal Medicine

## 2016-04-28 ENCOUNTER — Ambulatory Visit (AMBULATORY_SURGERY_CENTER): Payer: BC Managed Care – PPO | Admitting: Internal Medicine

## 2016-04-28 ENCOUNTER — Encounter: Payer: Self-pay | Admitting: Internal Medicine

## 2016-04-28 VITALS — BP 115/73 | HR 61 | Temp 98.4°F | Resp 18 | Ht 67.0 in | Wt 210.0 lb

## 2016-04-28 DIAGNOSIS — D125 Benign neoplasm of sigmoid colon: Secondary | ICD-10-CM | POA: Diagnosis not present

## 2016-04-28 DIAGNOSIS — Z1211 Encounter for screening for malignant neoplasm of colon: Secondary | ICD-10-CM | POA: Diagnosis not present

## 2016-04-28 MED ORDER — SODIUM CHLORIDE 0.9 % IV SOLN: 500.0000 mL | INTRAVENOUS | Status: DC

## 2016-04-28 NOTE — Patient Instructions (Signed)
YOU HAD AN ENDOSCOPIC PROCEDURE TODAY AT THE [facility]:   Refer to the procedure report that was given to you for any specific questions about what was found during the examination.  If the procedure report does not answer your questions, please call your gastroenterologist to clarify.  If you requested that your care partner not be given the details of your procedure findings, then the procedure report has been included in a sealed envelope for you to review at your convenience later.  YOU SHOULD EXPECT: Some feelings of bloating in the abdomen. Passage of more gas than usual.  Walking can help get rid of the air that was put into your GI tract during the procedure and reduce the bloating. If you had a lower endoscopy (such as a colonoscopy or flexible sigmoidoscopy) you may notice spotting of blood in your stool or on the toilet paper. If you underwent a bowel prep for your procedure, you may not have a normal bowel movement for a few days.  Please Note:  You might notice some irritation and congestion in your nose or some drainage.  This is from the oxygen used during your procedure.  There is no need for concern and it should clear up in a day or so.  SYMPTOMS TO REPORT IMMEDIATELY:   Following lower endoscopy (colonoscopy or flexible sigmoidoscopy):  Excessive amounts of blood in the stool  Significant tenderness or worsening of abdominal pains  Swelling of the abdomen that is new, acute  Fever of 100F or higher   For urgent or emergent issues, a gastroenterologist can be reached at any hour by calling [phone].   DIET: Your first meal following the procedure should be a small meal and then it is ok to progress to your normal diet. Heavy or fried foods are harder to digest and may make you feel nauseous or bloated.  Likewise, meals heavy in dairy and vegetables can increase bloating.  Drink plenty of fluids but you should avoid alcoholic beverages for 24  hours.  ACTIVITY:  You should plan to take it easy for the rest of today and you should NOT DRIVE or use heavy machinery until tomorrow (because of the sedation medicines used during the test).    FOLLOW UP: Our staff will call the number listed on your records the next business day following your procedure to check on you and address any questions or concerns that you may have regarding the information given to you following your procedure. If we do not reach you, we will leave a message.  However, if you are feeling well and you are not experiencing any problems, there is no need to return our call.  We will assume that you have returned to your regular daily activities without incident.  If any biopsies were taken you will be contacted by phone or by letter within the next 1-3 weeks.  Please call us at [phone] if you have not heard about the biopsies in 3 weeks.    SIGNATURES/CONFIDENTIALITY: You and/or your care partner have signed paperwork which will be entered into your electronic medical record.  These signatures attest to the fact that that the information above on your After Visit Summary has been reviewed and is understood.  Full responsibility of the confidentiality of this discharge information lies with you and/or your care-partner.    Resume medications. Information given on polyps. 

## 2016-04-28 NOTE — Progress Notes (Signed)
Called to room to assist during endoscopic procedure.  Patient ID and intended procedure confirmed with present staff. Received instructions for my participation in the procedure from the performing physician.  

## 2016-04-28 NOTE — Progress Notes (Signed)
To PACU  Awake and alert.  Report to RN 

## 2016-04-28 NOTE — Op Note (Signed)
[place] Patient Name: Tracy Perez Procedure Date: 04/28/2016 11:00 AM MRN: [id number] Endoscopist: Jerene Bears , MD Age: 50 Referring MD:  Date of Birth: Sep 05, 1966 Gender: Female Procedure:                Colonoscopy Indications:              Screening for colorectal malignant neoplasm Medicines:                Monitored Anesthesia Care Procedure:                Pre-Anesthesia Assessment:                           - Prior to the procedure, a History and Physical                            was performed, and patient medications and                            allergies were reviewed. The patient's tolerance of                            previous anesthesia was also reviewed. The risks                            and benefits of the procedure and the sedation                            options and risks were discussed with the patient.                            All questions were answered, and informed consent                            was obtained. Prior Anticoagulants: The patient has                            taken no previous anticoagulant or antiplatelet                            agents. ASA Grade Assessment: III - A patient with                            severe systemic disease. After reviewing the risks                            and benefits, the patient was deemed in                            satisfactory condition to undergo the procedure.                           After obtaining informed consent, the colonoscope  was passed under direct vision. Throughout the                            procedure, the patient's blood pressure, pulse, and                            oxygen saturations were monitored continuously. The                            Model PCF-[id number]) scope was introduced                            through the anus and advanced to the the cecum,                            identified by appendiceal orifice  and ileocecal                            valve. The colonoscopy was performed without                            difficulty. The patient tolerated the procedure                            well. The quality of the bowel preparation was                            excellent. The ileocecal valve, appendiceal                            orifice, and rectum were photographed. The bowel                            preparation used was SUPREP. Scope In: 11:10:55 AM Scope Out: 11:21:28 AM Scope Withdrawal Time: 0 hours 9 minutes 1 second  Total Procedure Duration: 0 hours 10 minutes 33 seconds  Findings:                 The digital rectal exam was normal.                           A 5 mm polyp was found in the sigmoid colon. The                            polyp was sessile. The polyp was removed with a                            cold snare. Resection and retrieval were complete.                           The exam was otherwise without abnormality on                            direct and retroflexion views. Complications:  No immediate complications. Estimated Blood Loss:     Estimated blood loss: none. Impression:               - One 5 mm polyp in the sigmoid colon, removed with                            a cold snare. Resected and retrieved.                           - The examination was otherwise normal on direct                            and retroflexion views. Recommendation:           - Patient has a contact number available for                            emergencies. The signs and symptoms of potential                            delayed complications were discussed with the                            patient. Return to normal activities tomorrow.                            Written discharge instructions were provided to the                            patient.                           - Resume previous diet.                           - Continue present medications.                            - Await pathology results.                           - Repeat colonoscopy is recommended. The                            colonoscopy date will be determined after pathology                            results from today's exam become available for                            review. Jerene Bears, MD 04/28/2016 11:24:03 AM This report has been signed electronically.

## 2016-04-29 ENCOUNTER — Telehealth: Payer: Self-pay

## 2016-04-29 NOTE — Telephone Encounter (Signed)
Attempt post procedure follow up call, no answer, left voice mail message.  

## 2016-05-05 ENCOUNTER — Encounter: Payer: Self-pay | Admitting: Internal Medicine

## 2016-05-11 ENCOUNTER — Telehealth: Payer: Self-pay | Admitting: Internal Medicine

## 2016-05-11 NOTE — Telephone Encounter (Signed)
Spoke with pt and her questions were answered.

## 2016-07-22 ENCOUNTER — Other Ambulatory Visit: Payer: Self-pay | Admitting: Hematology and Oncology

## 2016-07-22 DIAGNOSIS — D0592 Unspecified type of carcinoma in situ of left breast: Secondary | ICD-10-CM

## 2017-01-07 ENCOUNTER — Other Ambulatory Visit: Payer: Self-pay | Admitting: Hematology and Oncology

## 2017-01-07 DIAGNOSIS — D0592 Unspecified type of carcinoma in situ of left breast: Secondary | ICD-10-CM

## 2017-01-21 ENCOUNTER — Other Ambulatory Visit: Payer: Self-pay | Admitting: Nurse Practitioner

## 2017-01-21 ENCOUNTER — Other Ambulatory Visit: Payer: Self-pay | Admitting: Hematology and Oncology

## 2017-01-21 DIAGNOSIS — Z853 Personal history of malignant neoplasm of breast: Secondary | ICD-10-CM

## 2017-01-28 ENCOUNTER — Ambulatory Visit
Admission: RE | Admit: 2017-01-28 | Discharge: 2017-01-28 | Disposition: A | Discharge status: Home / Self Care | Payer: BC Managed Care – PPO | Source: Ambulatory Visit | Attending: Nurse Practitioner | Admitting: Nurse Practitioner

## 2017-01-28 ENCOUNTER — Other Ambulatory Visit: Payer: Self-pay | Admitting: Nurse Practitioner

## 2017-01-28 DIAGNOSIS — Z853 Personal history of malignant neoplasm of breast: Secondary | ICD-10-CM

## 2017-02-01 ENCOUNTER — Other Ambulatory Visit: Payer: Self-pay | Admitting: Nurse Practitioner

## 2017-02-01 ENCOUNTER — Ambulatory Visit
Admission: RE | Admit: 2017-02-01 | Discharge: 2017-02-01 | Disposition: A | Discharge status: Home / Self Care | Payer: BC Managed Care – PPO | Source: Ambulatory Visit | Attending: Nurse Practitioner | Admitting: Nurse Practitioner

## 2017-02-01 DIAGNOSIS — Z853 Personal history of malignant neoplasm of breast: Secondary | ICD-10-CM

## 2017-04-08 ENCOUNTER — Telehealth: Payer: Self-pay | Admitting: Hematology and Oncology

## 2017-04-08 NOTE — Telephone Encounter (Signed)
Pt called to r/s appt to 5/25 at 0900

## 2017-04-09 ENCOUNTER — Ambulatory Visit: Payer: BC Managed Care – PPO | Admitting: Hematology and Oncology

## 2017-04-16 ENCOUNTER — Encounter: Payer: Self-pay | Admitting: Hematology and Oncology

## 2017-04-16 ENCOUNTER — Telehealth: Payer: Self-pay | Admitting: Hematology and Oncology

## 2017-04-16 ENCOUNTER — Ambulatory Visit (HOSPITAL_BASED_OUTPATIENT_CLINIC_OR_DEPARTMENT_OTHER): Payer: BC Managed Care – PPO | Admitting: Hematology and Oncology

## 2017-04-16 DIAGNOSIS — C50112 Malignant neoplasm of central portion of left female breast: Secondary | ICD-10-CM

## 2017-04-16 DIAGNOSIS — Z170 Estrogen receptor positive status [ER+]: Secondary | ICD-10-CM

## 2017-04-16 DIAGNOSIS — F329 Major depressive disorder, single episode, unspecified: Secondary | ICD-10-CM

## 2017-04-16 DIAGNOSIS — D0512 Intraductal carcinoma in situ of left breast: Secondary | ICD-10-CM | POA: Diagnosis not present

## 2017-04-16 DIAGNOSIS — N951 Menopausal and female climacteric states: Secondary | ICD-10-CM | POA: Diagnosis not present

## 2017-04-16 MED ORDER — VENLAFAXINE HCL ER 37.5 MG PO CP24: 37.5000 mg | ORAL_CAPSULE | Freq: Every day | ORAL | 11 refills | Status: DC

## 2017-04-16 MED ORDER — TAMOXIFEN CITRATE 20 MG PO TABS: 20.0000 mg | ORAL_TABLET | Freq: Every day | ORAL | 3 refills | Status: DC

## 2017-04-16 NOTE — Progress Notes (Signed)
Patient Care Team: Berkley Harvey, NP as PCP - General (Nurse Practitioner)  DIAGNOSIS:  Encounter Diagnosis  Name Primary?  . Malignant neoplasm of central portion of left breast in female, estrogen receptor positive ([place])     SUMMARY OF ONCOLOGIC HISTORY:   Cancer of central portion of left female breast ([place])   01/16/2013 Initial Diagnosis    Left lumpectomy: DCIS with necrosis, grade 2, ER 100%, PR 85%, Tis N0 stage 0      03/06/2013 - 04/20/2013 Radiation Therapy    Adjuvant radiation therapy      05/11/2013 -  Anti-estrogen oral therapy    Tamoxifen 20 mg daily       CHIEF COMPLIANT: Follow-up on tamoxifen therapy complaining of depression symptoms  INTERVAL HISTORY: Tracy Perez is a 51 year old with above-mentioned history of left breast DCIS who underwent lumpectomy followed by radiation and is now on tamoxifen therapy. She is tolerating tamoxifen moderately well. She has hot flashes but more importantly she has profound mood swings and that she is complaining of depression. She is even met with a counselor once but decided not to continue with that but she was tearful in my office for no reason. She tells me that her family situation is perfectly fine but she cannot stop crying intermittently. She is also been eating too much junk food for comfort and that's causing her weight gain issues.  REVIEW OF SYSTEMS:   Constitutional: Denies fevers, chills or abnormal weight loss Eyes: Denies blurriness of vision Ears, nose, mouth, throat, and face: Denies mucositis or sore throat Respiratory: Denies cough, dyspnea or wheezes Cardiovascular: Denies palpitation, chest discomfort Gastrointestinal:  Denies nausea, heartburn or change in bowel habits Skin: Denies abnormal skin rashes Lymphatics: Denies new lymphadenopathy or easy bruising Neurological:Denies numbness, tingling or new weaknesses Behavioral/Psych: Depression  Extremities: No lower extremity edema Breast:   denies any pain or lumps or nodules in either breasts All other systems were reviewed with the patient and are negative.  I have reviewed the past medical history, past surgical history, social history and family history with the patient and they are unchanged from previous note.  ALLERGIES:  is allergic to gadolinium derivatives.  MEDICATIONS:  Current Outpatient Prescriptions  Medication Sig Dispense Refill  . albuterol (PROAIR HFA) 108 (90 BASE) MCG/ACT inhaler Inhale 2 puffs into the lungs every 6 (six) hours as needed for wheezing or shortness of breath.    . chlorthalidone (HYGROTON) 25 MG tablet Take 12.5 mg by mouth daily. Reported on 04/28/2016    . cholecalciferol (VITAMIN D) 1000 UNITS tablet Take 1,000 Units by mouth daily. Reported on 04/28/2016    . Iron TABS Take 1 tablet by mouth daily.    Marland Kitchen losartan (COZAAR) 50 MG tablet Take 50 mg by mouth daily.     . tamoxifen (NOLVADEX) 20 MG tablet Take 1 tablet (20 mg total) by mouth daily. 90 tablet 3  . tamoxifen (NOLVADEX) 20 MG tablet TAKE 1 TABLET(20 MG) BY MOUTH DAILY 90 tablet 0   No current facility-administered medications for this visit.     PHYSICAL EXAMINATION: ECOG PERFORMANCE STATUS: 1 - Symptomatic but completely ambulatory  Vitals:   04/16/17 0835  BP: (!) 149/83  Pulse: 98  Resp: 18  Temp: 99 F (37.2 C)   Filed Weights   04/16/17 0835  Weight: 201 lb 8 oz (91.4 kg)    GENERAL:alert, no distress and comfortable SKIN: skin color, texture, turgor are normal, no rashes or significant  lesions EYES: normal, Conjunctiva are pink and non-injected, sclera clear OROPHARYNX:no exudate, no erythema and lips, buccal mucosa, and tongue normal  NECK: supple, thyroid normal size, non-tender, without nodularity LYMPH:  no palpable lymphadenopathy in the cervical, axillary or inguinal LUNGS: clear to auscultation and percussion with normal breathing effort HEART: regular rate & rhythm and no murmurs and no lower  extremity edema ABDOMEN:abdomen soft, non-tender and normal bowel sounds MUSCULOSKELETAL:no cyanosis of digits and no clubbing  NEURO: alert & oriented x 3 with fluent speech, no focal motor/sensory deficits EXTREMITIES: No lower extremity edema BREAST: No palpable masses or nodules in either right or left breasts. No palpable axillary supraclavicular or infraclavicular adenopathy no breast tenderness or nipple discharge. (exam performed in the presence of a chaperone)  LABORATORY DATA:  I have reviewed the data as listed   Chemistry      Component Value Date/Time   NA 142 02/13/2016 0910   NA 141 03/18/2015 0823   K 3.7 02/13/2016 0910   K 3.8 03/18/2015 0823   CL 105 02/13/2016 0910   CL 106 02/13/2013 1303   CO2 29 02/13/2016 0910   CO2 23 03/18/2015 0823   BUN 14 02/13/2016 0910   BUN 8.8 03/18/2015 0823   CREATININE 0.65 02/13/2016 0910   CREATININE 0.7 03/18/2015 0823      Component Value Date/Time   CALCIUM 9.4 02/13/2016 0910   CALCIUM 8.7 03/18/2015 0823   ALKPHOS 54 03/18/2015 0823   AST 31 03/18/2015 0823   ALT 38 03/18/2015 0823   BILITOT 0.45 03/18/2015 0823       Lab Results  Component Value Date   WBC 7.7 02/13/2016   HGB 13.5 02/13/2016   HCT 41.3 02/13/2016   MCV 89.0 02/13/2016   PLT 297 02/13/2016   NEUTROABS 5.0 03/18/2015    ASSESSMENT & PLAN:  Cancer of central portion of left female breast Left breast DCIS treated with lumpectomy and adjuvant radiation therapy and currently on tamoxifen since June 2014.  Tamoxifen toxicities:  1. Hot flashes 2. major depression: Patient is tearful and going through lots of mood swings. I started her on Effexor X are 37.5 mg daily. I offered her counseling. 3. Weight gain due to eating comfort food: I counseled her extensively about exercise and watching her diet.  We discussed the role of 10 years of tamoxifen versus 5 years based on the atlas clinical trial. However with DCIS patient does not need to  stay on it beyond 5 years.  Breast Cancer Surveillance: 1. Breast exam 04/16/2017: Normal 2. Mammogram and ultrasound 01/23/2017: Interval development of low density mass lateral aspect of the right breast biopsy revealed cystic and micropapillary apocrine metaplasia, no malignancy  I discussed importance of exercise in decreasing the risk of breast cancer.  Return to clinic once a year for follow-up  I spent 25 minutes talking to the patient of which more than half was spent in counseling and coordination of care.  No orders of the defined types were placed in this encounter.  The patient has a good understanding of the overall plan. she agrees with it. she will call with any problems that may develop before the next visit here.   Rulon Eisenmenger, MD 04/16/17

## 2017-04-16 NOTE — Telephone Encounter (Signed)
Gave patient avs report and appointments for May 2019.

## 2017-04-16 NOTE — Assessment & Plan Note (Signed)
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  Tamoxifen toxicities: Patient is tolerating tamoxifen extremely well without any major problems or concerns. She has menopausal symptoms with occasional hot flashes.  Breast Cancer Surveillance: 1. Breast exam 04/16/2017: Normal 2. Mammogram and ultrasound 01/23/2017: Interval development of low density mass lateral aspect of the right breast biopsy revealed cystic and micropapillary apocrine metaplasia, no malignancy  I discussed importance of exercise in decreasing the risk of breast cancer.  Return to clinic once a year for follow-up

## 2017-06-13 ENCOUNTER — Other Ambulatory Visit: Payer: Self-pay | Admitting: Hematology and Oncology

## 2017-06-13 DIAGNOSIS — D0592 Unspecified type of carcinoma in situ of left breast: Secondary | ICD-10-CM

## 2017-06-22 ENCOUNTER — Other Ambulatory Visit: Payer: Self-pay | Admitting: Nurse Practitioner

## 2017-06-22 DIAGNOSIS — N6310 Unspecified lump in the right breast, unspecified quadrant: Secondary | ICD-10-CM

## 2017-08-05 ENCOUNTER — Ambulatory Visit
Admission: RE | Admit: 2017-08-05 | Discharge: 2017-08-05 | Disposition: A | Discharge status: Home / Self Care | Payer: BC Managed Care – PPO | Source: Ambulatory Visit | Attending: Nurse Practitioner | Admitting: Nurse Practitioner

## 2017-08-05 DIAGNOSIS — N6310 Unspecified lump in the right breast, unspecified quadrant: Secondary | ICD-10-CM

## 2017-09-08 ENCOUNTER — Other Ambulatory Visit: Payer: Self-pay | Admitting: Hematology and Oncology

## 2017-09-08 DIAGNOSIS — D0592 Unspecified type of carcinoma in situ of left breast: Secondary | ICD-10-CM

## 2018-02-04 ENCOUNTER — Other Ambulatory Visit: Payer: Self-pay | Admitting: Nurse Practitioner

## 2018-02-04 DIAGNOSIS — N63 Unspecified lump in unspecified breast: Secondary | ICD-10-CM

## 2018-02-10 ENCOUNTER — Ambulatory Visit
Admission: RE | Admit: 2018-02-10 | Discharge: 2018-02-10 | Disposition: A | Discharge status: Home / Self Care | Payer: BC Managed Care – PPO | Source: Ambulatory Visit | Attending: Nurse Practitioner | Admitting: Nurse Practitioner

## 2018-02-10 ENCOUNTER — Ambulatory Visit: Payer: BC Managed Care – PPO

## 2018-02-10 DIAGNOSIS — N63 Unspecified lump in unspecified breast: Secondary | ICD-10-CM

## 2018-04-29 ENCOUNTER — Encounter (HOSPITAL_COMMUNITY): Payer: Self-pay | Admitting: *Deleted

## 2018-04-29 ENCOUNTER — Inpatient Hospital Stay: Payer: BC Managed Care – PPO | Attending: Hematology and Oncology | Admitting: Hematology and Oncology

## 2018-04-29 ENCOUNTER — Emergency Department (HOSPITAL_COMMUNITY)
Admission: EM | Admit: 2018-04-29 | Discharge: 2018-04-29 | Disposition: A | Discharge status: Home / Self Care | Payer: BC Managed Care – PPO | Attending: Emergency Medicine | Admitting: Emergency Medicine

## 2018-04-29 ENCOUNTER — Other Ambulatory Visit: Payer: Self-pay

## 2018-04-29 DIAGNOSIS — Z5321 Procedure and treatment not carried out due to patient leaving prior to being seen by health care provider: Secondary | ICD-10-CM | POA: Diagnosis not present

## 2018-04-29 DIAGNOSIS — Z0489 Encounter for examination and observation for other specified reasons: Secondary | ICD-10-CM | POA: Diagnosis present

## 2018-04-29 NOTE — Progress Notes (Deleted)
Patient Care Team: Berkley Harvey, NP as PCP - General (Nurse Practitioner)  DIAGNOSIS:  Encounter Diagnosis  Name Primary?  . Malignant neoplasm of central portion of left breast in female, estrogen receptor positive ([place])     SUMMARY OF ONCOLOGIC HISTORY:   Cancer of central portion of left female breast ([place])   01/16/2013 Initial Diagnosis    Left lumpectomy: DCIS with necrosis, grade 2, ER 100%, PR 85%, Tis N0 stage 0      03/06/2013 - 04/20/2013 Radiation Therapy    Adjuvant radiation therapy      05/11/2013 - 04/29/2018 Anti-estrogen oral therapy    Tamoxifen 20 mg daily       CHIEF COMPLIANT: Follow-up on tamoxifen therapy  INTERVAL HISTORY: Tracy Perez is a 52 year old with above-mentioned history of DCIS left breast underwent lumpectomy radiation is currently on tamoxifen.  She completed 5 years of tamoxifen therapy.  Her major problems were related to depression.  She is taking Effexor for depression.  She denies any lumps or nodules in the breast.  REVIEW OF SYSTEMS:   Constitutional: Denies fevers, chills or abnormal weight loss Eyes: Denies blurriness of vision Ears, nose, mouth, throat, and face: Denies mucositis or sore throat Respiratory: Denies cough, dyspnea or wheezes Cardiovascular: Denies palpitation, chest discomfort Gastrointestinal:  Denies nausea, heartburn or change in bowel habits Skin: Denies abnormal skin rashes Lymphatics: Denies new lymphadenopathy or easy bruising Neurological:Denies numbness, tingling or new weaknesses Behavioral/Psych: Major depression Extremities: No lower extremity edema Breast:  denies any pain or lumps or nodules in either breasts All other systems were reviewed with the patient and are negative.  I have reviewed the past medical history, past surgical history, social history and family history with the patient and they are unchanged from previous note.  ALLERGIES:  is allergic to gadolinium  derivatives.  MEDICATIONS:  Current Outpatient Medications  Medication Sig Dispense Refill  . albuterol (PROAIR HFA) 108 (90 BASE) MCG/ACT inhaler Inhale 2 puffs into the lungs every 6 (six) hours as needed for wheezing or shortness of breath.    . chlorthalidone (HYGROTON) 25 MG tablet Take 12.5 mg by mouth daily. Reported on 04/28/2016    . cholecalciferol (VITAMIN D) 1000 UNITS tablet Take 1,000 Units by mouth daily. Reported on 04/28/2016    . Iron TABS Take 1 tablet by mouth daily.    Marland Kitchen losartan (COZAAR) 50 MG tablet Take 50 mg by mouth daily.     . tamoxifen (NOLVADEX) 20 MG tablet Take 1 tablet (20 mg total) by mouth daily. 90 tablet 3  . tamoxifen (NOLVADEX) 20 MG tablet TAKE 1 TABLET BY MOUTH DAILY 90 tablet 0  . venlafaxine XR (EFFEXOR-XR) 37.5 MG 24 hr capsule Take 1 capsule (37.5 mg total) by mouth daily with breakfast. 30 capsule 11   No current facility-administered medications for this visit.     PHYSICAL EXAMINATION: ECOG PERFORMANCE STATUS: 1 - Symptomatic but completely ambulatory  There were no vitals filed for this visit. There were no vitals filed for this visit.  GENERAL:alert, no distress and comfortable SKIN: skin color, texture, turgor are normal, no rashes or significant lesions EYES: normal, Conjunctiva are pink and non-injected, sclera clear OROPHARYNX:no exudate, no erythema and lips, buccal mucosa, and tongue normal  NECK: supple, thyroid normal size, non-tender, without nodularity LYMPH:  no palpable lymphadenopathy in the cervical, axillary or inguinal LUNGS: clear to auscultation and percussion with normal breathing effort HEART: regular rate & rhythm and no murmurs and  no lower extremity edema ABDOMEN:abdomen soft, non-tender and normal bowel sounds MUSCULOSKELETAL:no cyanosis of digits and no clubbing  NEURO: alert & oriented x 3 with fluent speech, no focal motor/sensory deficits EXTREMITIES: No lower extremity edema BREAST: No palpable masses or  nodules in either right or left breasts. No palpable axillary supraclavicular or infraclavicular adenopathy no breast tenderness or nipple discharge. (exam performed in the presence of a chaperone)  LABORATORY DATA:  I have reviewed the data as listed CMP Latest Ref Rng & Units 02/13/2016 03/18/2015 08/31/2014  Glucose 65 - 99 mg/dL 92 91 99  BUN 6 - 20 mg/dL 14 8.8 9.4  Creatinine 0.44 - 1.00 mg/dL 0.65 0.7 0.8  Sodium 135 - 145 mmol/L 142 141 142  Potassium 3.5 - 5.1 mmol/L 3.7 3.8 4.1  Chloride 101 - 111 mmol/L 105 - -  CO2 22 - 32 mmol/L 29 23 28   Calcium 8.9 - 10.3 mg/dL 9.4 8.7 9.3  Total Protein 6.4 - 8.3 g/dL - 6.5 6.9  Total Bilirubin 0.20 - 1.20 mg/dL - 0.45 0.55  Alkaline Phos 40 - 150 U/L - 54 50  AST 5 - 34 U/L - 31 18  ALT 0 - 55 U/L - 38 13    Lab Results  Component Value Date   WBC 7.7 02/13/2016   HGB 13.5 02/13/2016   HCT 41.3 02/13/2016   MCV 89.0 02/13/2016   PLT 297 02/13/2016   NEUTROABS 5.0 03/18/2015    ASSESSMENT & PLAN:  Cancer of central portion of left female breast currently on tamoxifen since June 2014.  Tamoxifen toxicities:  1. Hot flashes 2. major depression: on Effexor X are 37.5 mg daily.  3. Weight gain due to eating comfort food: I counseled her extensively about exercise and watching her diet.  This completes the tamoxifen therapy.  I instructed the patient that she can stop taking it.  Breast Cancer Surveillance: 1. Breast exam 04/29/2018: Normal 2. Mammogram and ultrasound 02/10/2018: Benign-appearing mass UOQ right breast stable, breast density category C      No orders of the defined types were placed in this encounter.  The patient has a good understanding of the overall plan. she agrees with it. she will call with any problems that may develop before the next visit here.   Harriette Ohara, MD 04/29/18

## 2018-04-29 NOTE — Assessment & Plan Note (Deleted)
currently on tamoxifen since June 2014.  Tamoxifen toxicities:  1. Hot flashes 2. major depression: on Effexor X are 37.5 mg daily.  3. Weight gain due to eating comfort food: I counseled her extensively about exercise and watching her diet.  This completes the tamoxifen therapy.  I instructed the patient that she can stop taking it.  Breast Cancer Surveillance: 1. Breast exam 04/29/2018: Normal 2. Mammogram and ultrasound 02/10/2018: Benign-appearing mass UOQ right breast stable, breast density category C

## 2018-04-29 NOTE — ED Notes (Signed)
Pt denied blood draw, notified Chris(RN)

## 2018-04-29 NOTE — ED Triage Notes (Signed)
The pt is here for si and hi  No plan  She has had these feelings for a year.  She has never been in  A mental facility

## 2018-04-29 NOTE — ED Triage Notes (Signed)
The pt is not agreeable to any of the above orders she wants leave  Her daughter is with her and will take her to mental health now  shje is leaving

## 2018-08-08 ENCOUNTER — Other Ambulatory Visit: Payer: Self-pay | Admitting: Obstetrics and Gynecology

## 2019-01-20 ENCOUNTER — Other Ambulatory Visit: Payer: Self-pay | Admitting: Nurse Practitioner

## 2019-01-20 DIAGNOSIS — N6310 Unspecified lump in the right breast, unspecified quadrant: Secondary | ICD-10-CM

## 2019-02-16 ENCOUNTER — Ambulatory Visit
Admission: RE | Admit: 2019-02-16 | Discharge: 2019-02-16 | Disposition: A | Discharge status: Home / Self Care | Payer: BC Managed Care – PPO | Source: Ambulatory Visit | Attending: Nurse Practitioner | Admitting: Nurse Practitioner

## 2019-02-16 ENCOUNTER — Other Ambulatory Visit: Payer: Self-pay

## 2019-02-16 DIAGNOSIS — N6310 Unspecified lump in the right breast, unspecified quadrant: Secondary | ICD-10-CM

## 2019-04-08 ENCOUNTER — Encounter (HOSPITAL_BASED_OUTPATIENT_CLINIC_OR_DEPARTMENT_OTHER): Payer: Self-pay | Admitting: Emergency Medicine

## 2019-04-08 ENCOUNTER — Other Ambulatory Visit: Payer: Self-pay

## 2019-04-08 ENCOUNTER — Emergency Department (HOSPITAL_BASED_OUTPATIENT_CLINIC_OR_DEPARTMENT_OTHER)
Admission: EM | Admit: 2019-04-08 | Discharge: 2019-04-08 | Disposition: A | Discharge status: Home / Self Care | Payer: BC Managed Care – PPO | Attending: Emergency Medicine | Admitting: Emergency Medicine

## 2019-04-08 DIAGNOSIS — I10 Essential (primary) hypertension: Secondary | ICD-10-CM | POA: Diagnosis not present

## 2019-04-08 DIAGNOSIS — Z923 Personal history of irradiation: Secondary | ICD-10-CM | POA: Diagnosis not present

## 2019-04-08 DIAGNOSIS — R82998 Other abnormal findings in urine: Secondary | ICD-10-CM | POA: Diagnosis present

## 2019-04-08 DIAGNOSIS — Z87891 Personal history of nicotine dependence: Secondary | ICD-10-CM | POA: Diagnosis not present

## 2019-04-08 DIAGNOSIS — Z79899 Other long term (current) drug therapy: Secondary | ICD-10-CM | POA: Insufficient documentation

## 2019-04-08 DIAGNOSIS — J45909 Unspecified asthma, uncomplicated: Secondary | ICD-10-CM | POA: Insufficient documentation

## 2019-04-08 DIAGNOSIS — Z853 Personal history of malignant neoplasm of breast: Secondary | ICD-10-CM | POA: Diagnosis not present

## 2019-04-08 DIAGNOSIS — N390 Urinary tract infection, site not specified: Secondary | ICD-10-CM

## 2019-04-08 LAB — URINALYSIS, ROUTINE W REFLEX MICROSCOPIC
Glucose, UA: NEGATIVE mg/dL
Hgb urine dipstick: NEGATIVE
Ketones, ur: 15 mg/dL — AB
Nitrite: POSITIVE — AB
Protein, ur: 100 mg/dL — AB
Specific Gravity, Urine: 1.025 (ref 1.005–1.030)
pH: 5.5 (ref 5.0–8.0)

## 2019-04-08 LAB — URINALYSIS, MICROSCOPIC (REFLEX): RBC / HPF: NONE SEEN RBC/hpf (ref 0–5)

## 2019-04-08 MED ORDER — CEPHALEXIN 500 MG PO CAPS: 500.0000 mg | ORAL_CAPSULE | Freq: Two times a day (BID) | ORAL | 0 refills | Status: AC

## 2019-04-08 NOTE — ED Triage Notes (Signed)
Pt reports she noticed her urine to be orange once last week and again today. Denies dysuria. Reports she recently added flax seed into her diet.

## 2019-04-08 NOTE — ED Provider Notes (Signed)
[facility] EMERGENCY DEPARTMENT Provider Note   CSN: [id number] Arrival date & time: 04/08/19  1221    History   Chief Complaint Chief Complaint  Patient presents with  . urine color change    HPI Tracy Perez is a 53 y.o. female.     The history is provided by the patient.  Illness  Location:  Urine Severity:  Mild Onset quality:  Gradual Timing:  Intermittent Chronicity:  New Context:  Patient concerned for orange urine. Second time this has happened in weeks. No different foods or medications. No abdominal pain or urinary symptoms. Relieved by:  Nothing Worsened by:  Nothing Associated symptoms: no abdominal pain, no congestion, no cough, no headaches and no shortness of breath     Past Medical History:  Diagnosis Date  . Allergy   . Anxiety   . Asthma   . 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 Fibroids   . GERD (gastroesophageal reflux disease)   . H/O Doppler ultrasound 02/17/16   left lower leg and right upper leg  . Hypertension   . Peripheral vascular disease ([place])   . S/P radiation therapy 03/06/2013-04/20/2013   Left Breast  /50.4Gy in 28 fractions/ Left Breast Boost / 10 Gy in 5 fractions  . Use of tamoxifen (Nolvadex)     Patient Active Problem List   Diagnosis Date Noted  . Cough variant asthma 05/23/2014  . Upper airway cough syndrome 04/23/2014  . Cancer of central portion of left female breast ([place]) 02/09/2013    Past Surgical History:  Procedure Laterality Date  . APPENDECTOMY    . BREAST LUMPECTOMY Left   . BREAST SURGERY    . CESAREAN SECTION      x 2  . COLONOSCOPY     06-07 was [place] in [place]  . DILATATION & CURETTAGE/HYSTEROSCOPY WITH MYOSURE N/A 02/18/2016   Procedure: DILATATION & CURETTAGE/HYSTEROSCOPY WITH MYOSURE;  Surgeon: Servando Salina, MD;  Location: [facility];  Service: Gynecology;   Laterality: N/A;  . LEG SURGERY Left 1994   vascular  . PARTIAL MASTECTOMY WITH NEEDLE LOCALIZATION Left 01/16/2013   Procedure: PARTIAL MASTECTOMY WITH NEEDLE LOCALIZATION;  Surgeon: Adin Hector, MD;  Location: [facility];  Service: General;  Laterality: Left;  Left partial mastectomy with needle localization at breast center of [place]      OB History    Gravida  2   Para  2   Term      Preterm      AB      Living        SAB      TAB      Ectopic      Multiple      Live Births           Obstetric Comments   menarche at 79, premenopause, G3P3 age first at first birth 47         Home Medications    Prior to Admission medications   Medication Sig Start Date End Date Taking? Authorizing Provider  albuterol (PROAIR HFA) 108 (90 BASE) MCG/ACT inhaler Inhale 2 puffs into the lungs every 6 (six) hours as needed for wheezing or shortness of breath.    [provider]  cephALEXin (KEFLEX) 500 MG capsule Take 1 capsule (500 mg total) by mouth 2 (two) times daily for 5 days.  04/08/19 04/13/19  Rachael Ferrie, DO  chlorthalidone (HYGROTON) 25 MG tablet Take 12.5 mg by mouth daily. Reported on 04/28/2016    [provider]  cholecalciferol (VITAMIN D) 1000 UNITS tablet Take 1,000 Units by mouth daily. Reported on 04/28/2016    [provider]  Iron TABS Take 1 tablet by mouth daily.    [provider]  losartan (COZAAR) 50 MG tablet Take 50 mg by mouth daily.  12/24/13   [provider]  tamoxifen (NOLVADEX) 20 MG tablet Take 1 tablet (20 mg total) by mouth daily. 04/16/17   Nicholas Lose, MD  tamoxifen (NOLVADEX) 20 MG tablet TAKE 1 TABLET BY MOUTH DAILY 09/08/17   Nicholas Lose, MD  venlafaxine XR (EFFEXOR-XR) 37.5 MG 24 hr capsule Take 1 capsule (37.5 mg total) by mouth daily with breakfast. 04/16/17   Nicholas Lose, MD    Family History Family History  Problem Relation Age of Onset  . Cancer Father        ? colon    . Asthma Mother   . Allergies Mother   . Cancer Maternal Aunt        breast  . Allergies Daughter   . Colon cancer Neg Hx   . Colon polyps Neg Hx   . Rectal cancer Neg Hx   . Stomach cancer Neg Hx     Social History Social History   Tobacco Use  . Smoking status: Former Smoker    Packs/day: 0.50    Years: 9.00    Pack years: 4.50    Types: Cigarettes    Last attempt to quit: 11/23/1989    Years since quitting: 29.3  . Smokeless tobacco: Never Used  Substance Use Topics  . Alcohol use: Yes    Alcohol/week: 0.0 standard drinks    Comment: social  . Drug use: No     Allergies   Gadolinium derivatives   Review of Systems Review of Systems  HENT: Negative for congestion.   Respiratory: Negative for cough and shortness of breath.   Gastrointestinal: Negative for abdominal pain.  Genitourinary: Negative for decreased urine volume, difficulty urinating, dyspareunia, dysuria, enuresis, flank pain, frequency, genital sores, hematuria, menstrual problem, pelvic pain, urgency, vaginal bleeding, vaginal discharge and vaginal pain.  Neurological: Negative for headaches.     Physical Exam Updated Vital Signs  ED Triage Vitals  Enc Vitals Group     BP 04/08/19 1226 (!) 169/91     Pulse Rate 04/08/19 1226 (!) 106     Resp 04/08/19 1226 16     Temp 04/08/19 1228 98.6 F (37 C)     Temp Source 04/08/19 1228 Oral     SpO2 04/08/19 1226 98 %     Weight 04/08/19 1226 200 lb (90.7 kg)     Height 04/08/19 1226 5\' 7"  (1.702 m)     Head Circumference --      Peak Flow --      Pain Score --      Pain Loc --      Pain Edu? --      Excl. in [place]? --     Physical Exam Constitutional:      Appearance: Normal appearance.  Abdominal:     General: Abdomen is flat. There is no distension.     Palpations: There is no mass.     Tenderness: There is no abdominal tenderness. There is no guarding or rebound.     Hernia: No hernia is present.  Skin:  Capillary Refill: Capillary  refill takes less than 2 seconds.  Neurological:     General: No focal deficit present.     Mental Status: She is alert.      ED Treatments / Results  Labs (all labs ordered are listed, but only abnormal results are displayed) Labs Reviewed  URINALYSIS, ROUTINE W REFLEX MICROSCOPIC - Abnormal; Notable for the following components:      Result Value   Color, Urine AMBER (*)    APPearance CLOUDY (*)    Bilirubin Urine MODERATE (*)    Ketones, ur 15 (*)    Protein, ur 100 (*)    Nitrite POSITIVE (*)    Leukocytes,Ua SMALL (*)    All other components within normal limits  URINALYSIS, MICROSCOPIC (REFLEX) - Abnormal; Notable for the following components:   Bacteria, UA MANY (*)    All other components within normal limits    EKG None  Radiology No results found.  Procedures Procedures (including critical care time)  Medications Ordered in ED Medications - No data to display   Initial Impression / Assessment and Plan / ED Course  I have reviewed the triage vital signs and the nursing notes.  Pertinent labs & imaging results that were available during my care of the patient were reviewed by me and considered in my medical decision making (see chart for details).     LEVA BAINE is a 53 year old female with history of breast cancer who presents to the ED due to change in her urine color.  Patient with unremarkable vitals.  No fever.  Patient states that her urine is more orange than normal today.  This happened several weeks ago.  She denies any abdominal pain, urinary symptoms, no symptoms to suggest dehydration.  This happened first thing when she woke up in the morning.  Suspicion that this is likely secondary to more concentrated urine than usual.  Will check urinalysis.  She denies any flank pain, hematuria, kidney stones.  Urinalysis positive for ketones, nitrates, many bacteria.  Suspect likely UTI causing color change.  Patient does have some bilirubin as well  and there.  She does not appear jaundice.  No fatigue.  Will treat UTI with Keflex.  Recommend that she follow-up with primary care doctor for repeat urinalysis and at that time may need a CMP as well.  Discharged in ED in good condition.  Suspect the symptoms are just likely from urinary tract infection.  This chart was dictated using voice recognition software.  Despite best efforts to proofread,  errors can occur which can change the documentation meaning.    Final Clinical Impressions(s) / ED Diagnoses   Final diagnoses:  Urinary tract infection without hematuria, site unspecified    ED Discharge Orders         Ordered    cephALEXin (KEFLEX) 500 MG capsule  2 times daily     04/08/19 [place], [place], DO 04/08/19 1305

## 2019-05-04 ENCOUNTER — Telehealth (HOSPITAL_COMMUNITY): Payer: Self-pay | Admitting: Psychiatry

## 2019-06-19 ENCOUNTER — Other Ambulatory Visit: Payer: Self-pay

## 2019-06-19 ENCOUNTER — Ambulatory Visit (INDEPENDENT_AMBULATORY_CARE_PROVIDER_SITE_OTHER): Payer: BC Managed Care – PPO | Admitting: Mental Health

## 2019-06-19 DIAGNOSIS — F332 Major depressive disorder, recurrent severe without psychotic features: Secondary | ICD-10-CM

## 2019-06-19 NOTE — Progress Notes (Signed)
ADULT PSYCHOSOCIAL ASSESSMENT     Name:   Tracy Perez, Tracy Perez DOB:   September 21, 2066 Date: 06/19/19 Time spent: 55 minutes  Informant:  Patient     Reason for Admission /Presenting Problem:   Pt stated she is stressed, crying daily, tired daily. She is in care w/ Eldridge Abrahams MD, [facility] ([place] location). She was rx'd medication (cannot recall name) about a year ago, stopped soon after and began using alcohol. She was having SI at that time, Dr. Ronnald Ramp tried to intervene but Pt did not go inpatient and was supported emotionally by her adult children. She is going through a divorce, to be finalized in about 2 months. She signed a financial agreement w/ her husband, she now regrets this and is coping w/ financial stress. They have been separated for about 14 months. He filed for divorce this past May. they were married for 33 years. Her husband is a Theme park manager of a church for the past 23 years. She stated he was having an affair w/ a woman who was a Company secretary at [employer] for the past 4 years. Pt tried to have church leadership intervene; they did not. Stated she feels hurt, angry, confused.  Mental Status Exam Appearance: appropriate Motor: WNL Speech/Language: Normal rate/rhythm Mood: congruent, depressed, anxious Affect:  tearful, constricted Thought process: Logical, linear, goal directed Thought content: denies SI/HI, denies AVH Perceptual disturbances: Denies AVH, no observable response to internal stimuli Orientation: X4 Attention: Focused Concentration: WNL Memory: Recent intact, reports impairments Fund of knowledge: WNL Insight: developing Judgment: good Impulse Control: good  Reported Symptoms:    depressed mood, tearful, anxiety, sleep deficits (waking at night), low motivation, anhedonia  Risk Assessment: Danger to Self:  denies Self-injurious Behavior: scratches her hands when anxious Danger to Others: denies Duty to Warn:   no Physical Aggression / Violence: denies Access to Firearms a concern:  no Gang Involvement: no          Patient / guardian was educated about steps to take if suicide or homicide risk level increases between visits. While future psychiatric events cannot be accurately predicted, the patient does not currently require acute inpatient psychiatric care and does not currently meet [facility] involuntary commitment criteria. Substance Abuse History: Current substance abuse:  None  Diagnosis: Major depressive disorder, recurrent severe without psychotic features ([place])  Individualized Plan of Care: (To complete part II of assessment next session) 1.  Patient to continue engage in a psychiatric evaluation to follow medication regimen. 2.  Patient to engage in individual psychotherapy. 3.  Patient to identify and apply coping skills learned in session to decrease depression and anxiety. 4.  Patient to learn and apply CBT, mindfulness skills and strategies learned in session decrease symptoms. 5.  Patient to contact this office, go to local ED or call 911 if a crisis or emergency develops between visits.  Anson [place], [facility]

## 2019-06-29 ENCOUNTER — Encounter (INDEPENDENT_AMBULATORY_CARE_PROVIDER_SITE_OTHER): Payer: Self-pay

## 2019-06-29 ENCOUNTER — Other Ambulatory Visit: Payer: Self-pay

## 2019-06-29 ENCOUNTER — Ambulatory Visit (INDEPENDENT_AMBULATORY_CARE_PROVIDER_SITE_OTHER): Payer: BC Managed Care – PPO | Admitting: Adult Health

## 2019-06-29 DIAGNOSIS — F331 Major depressive disorder, recurrent, moderate: Secondary | ICD-10-CM | POA: Diagnosis not present

## 2019-06-29 DIAGNOSIS — F411 Generalized anxiety disorder: Secondary | ICD-10-CM | POA: Diagnosis not present

## 2019-06-29 DIAGNOSIS — G4700 Insomnia, unspecified: Secondary | ICD-10-CM | POA: Diagnosis not present

## 2019-06-30 ENCOUNTER — Ambulatory Visit: Payer: BC Managed Care – PPO | Admitting: Mental Health

## 2019-07-05 ENCOUNTER — Ambulatory Visit: Payer: BC Managed Care – PPO | Admitting: Mental Health

## 2019-07-13 ENCOUNTER — Other Ambulatory Visit: Payer: Self-pay

## 2019-07-13 ENCOUNTER — Ambulatory Visit (INDEPENDENT_AMBULATORY_CARE_PROVIDER_SITE_OTHER): Payer: BC Managed Care – PPO | Admitting: Mental Health

## 2019-07-13 DIAGNOSIS — F332 Major depressive disorder, recurrent severe without psychotic features: Secondary | ICD-10-CM | POA: Diagnosis not present

## 2019-07-13 NOTE — Progress Notes (Signed)
[facility] Psychotherapy Note     Name:   Tracy, Perez DOB:   11-28-2065 Date: 07/13/19 Time spent: 53 minutes  Mental Status Exam Appearance: appropriate Motor: WNL Speech/Language: Normal rate/rhythm Mood: congruent, depressed, anxious Affect:  tearful, constricted Thought process: Logical, linear, goal directed Thought content: denies SI/HI, denies AVH Perceptual disturbances: Denies AVH, no observable response to internal stimuli Orientation: X4 Attention: Focused Concentration: WNL Memory: Recent intact, reports impairments Fund of knowledge: WNL Insight: developing Judgment: good Impulse Control: good  Reported Symptoms:    depressed mood, tearful, anxiety, sleep deficits (waking at night), low motivation, anhedonia  Risk Assessment: Danger to Self:  denies Self-injurious Behavior: scratches her hands when anxious Danger to Others: denies Duty to Warn:  no Physical Aggression / Violence: denies Access to Firearms a concern:  no Gang Involvement: no          Patient / guardian was educated about steps to take if suicide or homicide risk level increases between visits. While future psychiatric events cannot be accurately predicted, the patient does not currently require acute inpatient psychiatric care and does not currently meet [facility] involuntary commitment criteria.  Substance Abuse History: reports some alcohol use about 2-3x/week "a few beers"  Abuse History: Victim - none Report needed: No. Victim of Neglect:No. Perpetrator of none  Witness / Exposure to Domestic Violence: No   Protective Services Involvement: No  Witness to [employer] Violence:  No   Family History: Raised by both parents.   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  Patient presents with a sad mood, tearful.  She shared how her mood has been "about the same".  She continues to have support from her children emotionally as well as a close friend.  Provide support as she processed feelings related to the change in her relationships, her marriage and feelings of abandonment and judgment from her church community in which her husband was a Theme park manager.  She expressed feelings of not knowing who she is at this point in her life.  Encouraged patient to identify self strengths between sessions giving examples.  Patient also was reminded of the importance she is to family specifically her children in which she is shared thus far in therapy.  She stated her daughter recently asked to have Sunday dinners weekly at her home as they once did for many years prior to she and her husband separating.  She shared how her children have had to adjust to their father not being as involved in their lives.  An example was his not being present for a get together they had arranged for him for Father's Day.  Encouraged patient to identify between sessions steps toward self-care.  Gave patient examples in the areas of exercise, engaging in activities, interests.  Shared how she attended her psychiatric evaluation but declined on taking medications at this time but was encouraged to call our office if this changed.  Interventions:  CBT, supportive therapy, mindfulness concepts  Diagnosis: Major depressive disorder, recurrent severe without psychotic features ([place])  Individualized Plan of Care:  1.  Patient to continue engage in a psychiatric evaluation to follow medication regimen. 2.  Patient to engage in individual psychotherapy. 3.  Patient to identify and apply coping skills learned in session to decrease depression and anxiety. 4.  Patient to learn and apply CBT, mindfulness skills and strategies learned in session decrease symptoms. 5.  Patient to contact this office, go to local ED or call 911 if a crisis or emergency develops between visits.  Anson [place], [facility]

## 2019-07-15 NOTE — Progress Notes (Signed)
[facility] MD/PA/NP Initial Note  07/16/2019 12:17 AM Tracy Perez  MRN:  [id number]  Chief Complaint: Depression, Anxiety, Insomnia   HPI:   Describes mood today as "not the best". Pleasant. Flat. Mood symptoms - reports depression, anxiety, and irritability for the past 2 and 1/2 years. Stating "I have been dealing with so much". She and husband have separated after 33 years. Stating "there is just a bunch of stuff going on". Has spoken with therapist about medication. Stating "I'm just paranoid about medications". She would like to try and "handle" things without it for now. Children are talking to her and have been very supportive. Decreased interest and motivation.  Energy levels stable. Active, does not have a regular exercise routine. Is walking some days. Enjoys some usual interests and activities. Spending time with family - children. Appetite decreased. Weight loss. Stating "my children are staying on me".  Sleeps well most nights. Averages 6 to 8 hours. Focus and concentration stable - "it's not like it used to be". Completing tasks. Managing aspects of household.   Denies SI or HI. Denies AH or VH.  Visit Diagnosis:    ICD-10-CM   1. Generalized anxiety disorder  F41.1   2. Major depressive disorder, recurrent episode, moderate (HCC)  F33.1   3. Insomnia, unspecified type  G47.00     Past Psychiatric History:   Past Medical History:  Past Medical History:  Diagnosis Date  . Allergy   . Anxiety   . Asthma   . 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 Fibroids   . GERD (gastroesophageal reflux disease)   . H/O Doppler ultrasound 02/17/16   left lower leg and right upper leg  . Hypertension   . Peripheral vascular disease ([place])   . S/P radiation therapy 03/06/2013-04/20/2013   Left Breast  /50.4Gy in 28 fractions/ Left Breast Boost / 10 Gy in 5 fractions   . Use of tamoxifen (Nolvadex)     Past Surgical History:  Procedure Laterality Date  . APPENDECTOMY    . BREAST LUMPECTOMY Left   . BREAST SURGERY    . CESAREAN SECTION      x 2  . COLONOSCOPY     06-07 was normal in [place]  . DILATATION & CURETTAGE/HYSTEROSCOPY WITH MYOSURE N/A 02/18/2016   Procedure: DILATATION & CURETTAGE/HYSTEROSCOPY WITH MYOSURE;  Surgeon: Servando Salina, MD;  Location: [facility];  Service: Gynecology;  Laterality: N/A;  . LEG SURGERY Left 1994   vascular  . PARTIAL MASTECTOMY WITH NEEDLE LOCALIZATION Left 01/16/2013   Procedure: PARTIAL MASTECTOMY WITH NEEDLE LOCALIZATION;  Surgeon: Adin Hector, MD;  Location: [facility];  Service: General;  Laterality: Left;  Left partial mastectomy with needle localization at breast center of [place]     Family Psychiatric History: Denies  Family History:  Family History  Problem Relation Age of Onset  . Cancer Father        ? colon   . Asthma Mother   . Allergies Mother   . Cancer Maternal Aunt        breast  . Allergies Daughter   . Colon cancer Neg Hx   . Colon polyps Neg Hx   . Rectal cancer Neg Hx   . Stomach cancer Neg Hx     Social History:  Social History   Socioeconomic History  . Marital status: Married  Spouse name: Not on file  . Number of children: Not on file  . Years of education: Not on file  . Highest education level: Not on file  Occupational History  . Occupation: Company secretary at [employer]  . Financial resource strain: Not on file  . Food insecurity    Worry: Not on file    Inability: Not on file  . Transportation needs    Medical: Not on file    Non-medical: Not on file  Tobacco Use  . Smoking status: Former Smoker    Packs/day: 0.50    Years: 9.00    Pack years: 4.50    Types: Cigarettes    Quit date: 11/23/1989    Years since quitting: 29.6  . Smokeless tobacco: Never Used  Substance and Sexual Activity  .  Alcohol use: Yes    Alcohol/week: 0.0 standard drinks    Comment: social  . Drug use: No  . Sexual activity: Yes    Birth control/protection: None    Comment: menarche age 51, G44, P60, 1st birth age 84  Lifestyle  . Physical activity    Days per week: Not on file    Minutes per session: Not on file  . Stress: Not on file  Relationships  . Social Herbalist on phone: Not on file    Gets together: Not on file    Attends religious service: Not on file    Active member of club or organization: Not on file    Attends meetings of clubs or organizations: Not on file    Relationship status: Not on file  Other Topics Concern  . Not on file  Social History Narrative  . Not on file    Allergies:  Allergies  Allergen Reactions  . Gadolinium Derivatives Nausea And Vomiting    Nausea and vomiting immediately following injection.  MRI Dye     Metabolic Disorder Labs: No results found for: HGBA1C, MPG No results found for: PROLACTIN No results found for: CHOL, TRIG, HDL, CHOLHDL, VLDL, LDLCALC No results found for: TSH  Therapeutic Level Labs: No results found for: LITHIUM No results found for: VALPROATE No components found for:  CBMZ  Current Medications: Current Outpatient Medications  Medication Sig Dispense Refill  . albuterol (PROAIR HFA) 108 (90 BASE) MCG/ACT inhaler Inhale 2 puffs into the lungs every 6 (six) hours as needed for wheezing or shortness of breath.    . chlorthalidone (HYGROTON) 25 MG tablet Take 12.5 mg by mouth daily. Reported on 04/28/2016    . cholecalciferol (VITAMIN D) 1000 UNITS tablet Take 1,000 Units by mouth daily. Reported on 04/28/2016    . Iron TABS Take 1 tablet by mouth daily.    Marland Kitchen losartan (COZAAR) 50 MG tablet Take 50 mg by mouth daily.     . tamoxifen (NOLVADEX) 20 MG tablet Take 1 tablet (20 mg total) by mouth daily. 90 tablet 3  . tamoxifen (NOLVADEX) 20 MG tablet TAKE 1 TABLET BY MOUTH DAILY 90 tablet 0  . venlafaxine XR  (EFFEXOR-XR) 37.5 MG 24 hr capsule Take 1 capsule (37.5 mg total) by mouth daily with breakfast. 30 capsule 11   No current facility-administered medications for this visit.     Medication Side Effects: none  Orders placed this visit:  No orders of the defined types were placed in this encounter.   Psychiatric Specialty Exam:  ROS  There were no vitals taken  for this visit.There is no height or weight on file to calculate BMI.  General Appearance: Neat and Well Groomed  Eye Contact:  Fair  Speech:  Clear and Coherent  Volume:  Normal  Mood:  Depressed  Affect:  Congruent  Thought Process:  Coherent  Orientation:  Full (Time, Place, and Person)  Thought Content: Logical   Suicidal Thoughts:  No  Homicidal Thoughts:  No  Memory:  WNL  Judgement:  Fair  Insight:  Fair  Psychomotor Activity:  Normal  Concentration:  Concentration: Fair  Recall:  NA  Fund of Knowledge: Good  Language: Good  Assets:  Communication Skills Desire for Improvement Financial Resources/Insurance Housing Intimacy Leisure Time Physical Health Resilience Social Support Talents/Skills Transportation  ADL's:  Intact  Cognition: WNL  Prognosis:  Fair   Screenings:   Receiving Psychotherapy: Yes   Treatment Plan/Recommendations:   Plan:  1. Discussed medications to stabilize mood - patient refused 2. Plans to continue therapy  RTC 4 weeks  Patient advised to contact office with any questions, adverse effects, or acute worsening in signs and symptoms.    Aloha Gell, NP

## 2019-07-16 ENCOUNTER — Encounter: Payer: Self-pay | Admitting: Adult Health

## 2019-07-27 ENCOUNTER — Ambulatory Visit: Payer: BC Managed Care – PPO | Admitting: Mental Health

## 2019-08-15 ENCOUNTER — Ambulatory Visit (INDEPENDENT_AMBULATORY_CARE_PROVIDER_SITE_OTHER): Payer: BC Managed Care – PPO | Admitting: Mental Health

## 2019-08-15 ENCOUNTER — Other Ambulatory Visit: Payer: Self-pay

## 2019-08-15 DIAGNOSIS — F332 Major depressive disorder, recurrent severe without psychotic features: Secondary | ICD-10-CM | POA: Diagnosis not present

## 2019-08-15 NOTE — Progress Notes (Signed)
[facility] Note     Name:   Tracy Perez, Tracy Perez DOB:   2066/03/26 Date: 08/15/19 Time spent: 54 minutes  Treatment: Individual therapy  Mental Status Exam Appearance: appropriate Motor: WNL Speech/Language: Normal rate/rhythm Mood: congruent, depressed, anxious Affect:  tearful, constricted Thought process: Logical, linear, goal directed Thought content: denies SI/HI, denies AVH Perceptual disturbances: Denies AVH, no observable response to internal stimuli Orientation: X4 Attention: Focused Concentration: WNL Memory: Recent intact, reports impairments Fund of knowledge: WNL Insight: developing Judgment: good Impulse Control: good  Reported Symptoms:    depressed mood, tearful, anxiety, sleep deficits (waking at night), low motivation, anhedonia  Risk Assessment: Danger to Self:  denies Self-injurious Behavior: scratches her hands when anxious Danger to Others: denies Duty to Warn:  no Physical Aggression / Violence: denies Access to Firearms a concern:  no Gang Involvement: no          Patient / guardian was educated about steps to take if suicide or homicide risk level increases between visits. While future psychiatric events cannot be accurately predicted, the patient does not currently require acute inpatient psychiatric care and does not currently meet [facility] involuntary commitment criteria.  Subjective:   Patient presented to session on time.  Discussed progress and events since last session.  She shared how she has been feeling "numb".  She continues to share how she has been depressed and is tearful during session, she continues to adjust to her marital separation.  She reports financial stress concerns about her future in this area.  She shared how she "wants to stop thinking about it, stop having feelings for him".  She expresses regret for how she reacted at times, how it affected her children.  She has been able to talk to them and she  stated they have provided her support and reassurance.  We discussed the relationship, stages of grief and how they can marriage the loss of her relationship particularly a marriage 1 of which lasting 30 years.  She shared how she wishes she had known when her husband decided to move on, how he easily transitioned it seemed after they separated to being with another woman.  She vented how she wants to be "strong again" be a role model and the main support for her children not the other way around.  We explore how we define strength.  We discussed ways to take steps toward acceptance.  Encouraged her to identify significant self strengths, purpose between sessions.  Interventions: CBT, supportive therapy, mindfulness concepts  Diagnosis: Major depressive disorder, recurrent severe without psychotic features ([place])  Individualized Plan of Care:  1.  Patient to continue engage in a psychiatric evaluation to follow medication regimen. 2.  Patient to engage in individual psychotherapy. 3.  Patient to identify and apply coping skills learned in session to decrease depression and anxiety. 4.  Patient to learn and apply CBT, mindfulness skills and strategies learned in session decrease symptoms. 5.  Patient to contact this office, go to local ED or call 911 if a crisis or emergency develops between visits.  Anson [place], [facility]

## 2019-08-30 ENCOUNTER — Ambulatory Visit: Payer: BC Managed Care – PPO | Admitting: Mental Health

## 2020-01-15 ENCOUNTER — Other Ambulatory Visit: Payer: Self-pay | Admitting: Nurse Practitioner

## 2020-01-15 DIAGNOSIS — Z1231 Encounter for screening mammogram for malignant neoplasm of breast: Secondary | ICD-10-CM

## 2020-02-19 ENCOUNTER — Other Ambulatory Visit: Payer: Self-pay

## 2020-02-19 ENCOUNTER — Ambulatory Visit
Admission: RE | Admit: 2020-02-19 | Discharge: 2020-02-19 | Disposition: A | Discharge status: Home / Self Care | Payer: BC Managed Care – PPO | Source: Ambulatory Visit | Attending: Nurse Practitioner | Admitting: Nurse Practitioner

## 2020-02-19 DIAGNOSIS — Z1231 Encounter for screening mammogram for malignant neoplasm of breast: Secondary | ICD-10-CM

## 2021-01-27 ENCOUNTER — Other Ambulatory Visit: Payer: Self-pay | Admitting: Nurse Practitioner

## 2021-01-27 DIAGNOSIS — Z1231 Encounter for screening mammogram for malignant neoplasm of breast: Secondary | ICD-10-CM

## 2021-03-21 ENCOUNTER — Other Ambulatory Visit: Payer: Self-pay

## 2021-03-21 ENCOUNTER — Ambulatory Visit
Admission: RE | Admit: 2021-03-21 | Discharge: 2021-03-21 | Disposition: A | Discharge status: Home / Self Care | Payer: BC Managed Care – PPO | Source: Ambulatory Visit | Attending: Nurse Practitioner | Admitting: Nurse Practitioner

## 2021-03-21 DIAGNOSIS — Z1231 Encounter for screening mammogram for malignant neoplasm of breast: Secondary | ICD-10-CM

## 2021-05-06 ENCOUNTER — Encounter: Payer: Self-pay | Admitting: Physician Assistant

## 2021-06-04 ENCOUNTER — Encounter: Payer: Self-pay | Admitting: Physician Assistant

## 2021-06-04 ENCOUNTER — Ambulatory Visit: Payer: BC Managed Care – PPO | Admitting: Physician Assistant

## 2021-06-04 VITALS — BP 122/78 | HR 80 | Ht 67.0 in | Wt 200.5 lb

## 2021-06-04 DIAGNOSIS — K219 Gastro-esophageal reflux disease without esophagitis: Secondary | ICD-10-CM | POA: Diagnosis not present

## 2021-06-04 DIAGNOSIS — R194 Change in bowel habit: Secondary | ICD-10-CM | POA: Diagnosis not present

## 2021-06-04 DIAGNOSIS — Z86010 Personal history of colonic polyps: Secondary | ICD-10-CM | POA: Diagnosis not present

## 2021-06-04 DIAGNOSIS — Z860101 Personal history of adenomatous and serrated colon polyps: Secondary | ICD-10-CM

## 2021-06-04 MED ORDER — CLENPIQ 10-3.5-12 MG-GM -GM/160ML PO SOLN: 1.0000 | Freq: Once | ORAL | 0 refills | Status: AC

## 2021-06-04 NOTE — Progress Notes (Signed)
Chief Complaint: Constipation and epigastric pain  HPI:    Tracy Perez is a 55 year old African-American female with a past medical history of breast cancer, GERD and others listed below, known to Dr. Hilarie Fredrickson, who was referred to me by Berkley Harvey, NP for a complaint of constipation and epigastric pain.    04/28/2016 colonoscopy for screening with one 5 mm polyp in the sigmoid colon, and otherwise normal.  Pathology showed tubular adenoma.  Repeat colonoscopy recommended in 5 years.    03/04/2021 CBC and CMP normal.    Today, the patient presents to clinic and tells me that about 6 months ago or so she had a change in bowel habits toward constipation and also noticed a color change but around that time her PCP told her to take Pepto-Bismol which she noted later had caused her stools to look darker.  She also tended towards constipation and started Benefiber recently which is made her now fairly normal over the past couple of months as well as consistent.    Along with this describes reflux, apparently had issues years ago where "I was put on a medicine", apparently this "made it all better", and she stopped taking the medicine but over the past few months she has had intermittent symptoms maybe once a week depending on what she eats.    Denies fever, chills, weight loss, dysphagia, abdominal pain or symptoms that awaken her from sleep.  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, Macrocalcifications identified / ER 100%, PR 85%   Fibroids    GERD (gastroesophageal reflux disease)    H/O Doppler ultrasound 02/17/16   left lower leg and right upper leg   Hypertension    Peripheral vascular disease (HCC)    S/P radiation therapy 03/06/2013-04/20/2013   Left Breast  /50.4Gy in 28 fractions/ Left Breast Boost / 10 Gy in 5 fractions   Use of tamoxifen (Nolvadex)      Past Surgical History:  Procedure Laterality Date   APPENDECTOMY     BREAST LUMPECTOMY Left    BREAST SURGERY     CESAREAN SECTION      x 2   COLONOSCOPY     06-07 was normal in [place]   [place] N/A 02/18/2016   Procedure: DILATATION & CURETTAGE/HYSTEROSCOPY WITH MYOSURE;  Surgeon: Servando Salina, MD;  Location: [facility];  Service: Gynecology;  Laterality: N/A;   LEG SURGERY Left 1994   vascular   PARTIAL MASTECTOMY WITH NEEDLE LOCALIZATION Left 01/16/2013   Procedure: PARTIAL MASTECTOMY WITH NEEDLE LOCALIZATION;  Surgeon: Adin Hector, MD;  Location: [facility];  Service: General;  Laterality: Left;  Left partial mastectomy with needle localization at breast center of [place]     Current Outpatient Medications  Medication Sig Dispense Refill   amLODipine (NORVASC) 10 MG tablet Take 1 tablet by mouth daily.     albuterol (PROAIR HFA) 108 (90 BASE) MCG/ACT inhaler Inhale 2 puffs into the lungs every 6 (six) hours as needed for wheezing or shortness of breath.     chlorthalidone (HYGROTON) 25 MG tablet Take 12.5 mg by mouth daily. Reported on 04/28/2016     cholecalciferol (VITAMIN D) 1000 UNITS tablet Take 1,000 Units by mouth daily. Reported on 04/28/2016     escitalopram (LEXAPRO) 20 MG tablet  Take 20 mg by mouth daily.     losartan (COZAAR) 50 MG tablet Take 50 mg by mouth daily.      potassium chloride (KLOR-CON) 10 MEQ tablet Take 10 mEq by mouth daily.     No current facility-administered medications for this visit.    Allergies as of 06/04/2021 - Review Complete 06/04/2021  Allergen Reaction Noted   Gadolinium derivatives Nausea And Vomiting 01/24/2013    Family History  Problem Relation Age of Onset   Cancer Father        ? colon    Asthma Mother    Allergies Mother    Cancer Maternal Aunt        breast   Allergies Daughter    Colon cancer Neg Hx    Colon polyps Neg Hx    Rectal cancer Neg Hx     Stomach cancer Neg Hx     Social History   Socioeconomic History   Marital status: Married    Spouse name: Not on file   Number of children: Not on file   Years of education: Not on file   Highest education level: Not on file  Occupational History   Occupation: Company secretary at [employer]    Employer: [employer]  Tobacco Use   Smoking status: Former    Packs/day: 0.50    Years: 9.00    Pack years: 4.50    Types: Cigarettes    Quit date: 11/23/1989    Years since quitting: 31.5   Smokeless tobacco: Never  Substance and Sexual Activity   Alcohol use: Yes    Alcohol/week: 0.0 standard drinks    Comment: social   Drug use: No   Sexual activity: Yes    Birth control/protection: None    Comment: menarche age 15, G38, P31, 1st birth age 22  Other Topics Concern   Not on file  Social History Narrative   Not on file   Social Determinants of Health   Financial Resource Strain: Not on file  Food Insecurity: Not on file  Transportation Needs: Not on file  Physical Activity: Not on file  Stress: Not on file  Social Connections: Not on file  Intimate Partner Violence: Not on file    Review of Systems:    Constitutional: No weight loss, fever or chills Skin: No rash Cardiovascular: No chest pain Respiratory: No SOB  Gastrointestinal: See HPI and otherwise negative Genitourinary: No dysuria Neurological: No headache, dizziness or syncope Musculoskeletal: No new muscle or joint pain Hematologic: No bleeding  Psychiatric: No history of depression or anxiety   Physical Exam:  Vital signs: BP 122/78 (BP Location: Right Arm, Patient Position: Sitting, Cuff Size: Normal)   Pulse 80   Ht 5\' 7"  (1.702 m) Comment: height measured without shoes  Wt 200 lb 8 oz (90.9 kg)   BMI 31.40 kg/m    Constitutional:   Pleasant AA female appears to be in NAD, Well developed, Well nourished, alert and cooperative Head:  Normocephalic and atraumatic. Eyes:   PEERL, EOMI. No icterus.  Conjunctiva pink. Ears:  Normal auditory acuity. Neck:  Supple Throat: Oral cavity and pharynx without inflammation, swelling or lesion.  Respiratory: Respirations even and unlabored. Lungs clear to auscultation bilaterally.   No wheezes, crackles, or rhonchi.  Cardiovascular: Normal S1, S2. No MRG. Regular rate and rhythm. No peripheral edema, cyanosis or pallor.  Gastrointestinal:  Soft, nondistended, nontender. No rebound or guarding. Normal bowel sounds. No appreciable masses or hepatomegaly. Rectal:  Not performed.  Msk:  Symmetrical without gross deformities. Without edema, no deformity or joint abnormality.  Neurologic:  Alert and  oriented x4;  grossly normal neurologically.  Skin:   Dry and intact without significant lesions or rashes. Psychiatric: Demonstrates good judgement and reason without abnormal affect or behaviors.  NO recent labs.  Assessment: 1.  History of tubular adenomatous colon: Last colonoscopy 5 years ago with recommendations to repeat in 5 years 2.  Change in bowel habits: Towards constipation, better with fiber supplement daily 3.  GERD: Maybe once a week symptoms depending on what she eats, prior history with what sounds like a PPI; consider gastritis  Plan: 1.  Scheduled patient for a surveillance colonoscopy due to history of adenomatous polyps as well as a diagnostic EGD given history of reflux with Dr. Hilarie Fredrickson.  Patient requested a Monday and this was scheduled at the end of August.  Did provide the patient with a detailed list of risks for the procedures and she agrees to proceed. 2.  Discussed using Pepcid 10 to 20 mg over-the-counter as needed for reflux symptoms and/or Tums as they do not happen very often. 3.  Encouraged the patient to continue her Benefiber which has made her regular. 4.  Patient to follow in clinic per recommendations from Dr. Hilarie Fredrickson after time of procedures.  Ellouise Newer, PA-C [facility] 06/04/2021, 8:53 AM  Cc:  Berkley Harvey, NP

## 2021-06-04 NOTE — Patient Instructions (Signed)
Try Pepcid or Tums for occasional reflux.   You have been scheduled for a colonoscopy. Please follow written instructions given to you at your visit today.  Please pick up your prep supplies at the pharmacy within the next 1-3 days. If you use inhalers (even only as needed), please bring them with you on the day of your procedure.  If you are age 56 or older, your body mass index should be between 23-30. Your Body mass index is 31.4 kg/m. If this is out of the aforementioned range listed, please consider follow up with your Primary Care Provider.  If you are age 32 or younger, your body mass index should be between 19-25. Your Body mass index is 31.4 kg/m. If this is out of the aformentioned range listed, please consider follow up with your Primary Care Provider.   __________________________________________________________  The [facility] providers would like to encourage you to use [facility] to communicate with providers for non-urgent requests or questions.  Due to long hold times on the telephone, sending your provider a message by [facility] may be a faster and more efficient way to get a response.  Please allow 48 business hours for a response.  Please remember that this is for non-urgent requests.

## 2021-06-20 NOTE — Progress Notes (Signed)
Addendum: Reviewed and agree with assessment and management plan. Halana Deisher M, MD  

## 2021-07-14 ENCOUNTER — Other Ambulatory Visit: Payer: Self-pay | Admitting: Obstetrics and Gynecology

## 2021-07-21 ENCOUNTER — Ambulatory Visit (AMBULATORY_SURGERY_CENTER): Payer: BC Managed Care – PPO | Admitting: Internal Medicine

## 2021-07-21 ENCOUNTER — Other Ambulatory Visit: Payer: Self-pay

## 2021-07-21 ENCOUNTER — Encounter: Payer: Self-pay | Admitting: Internal Medicine

## 2021-07-21 VITALS — BP 121/82 | HR 68 | Temp 97.1°F | Resp 16 | Ht 67.0 in | Wt 200.0 lb

## 2021-07-21 DIAGNOSIS — D128 Benign neoplasm of rectum: Secondary | ICD-10-CM | POA: Diagnosis not present

## 2021-07-21 DIAGNOSIS — K2960 Other gastritis without bleeding: Secondary | ICD-10-CM | POA: Diagnosis not present

## 2021-07-21 DIAGNOSIS — K253 Acute gastric ulcer without hemorrhage or perforation: Secondary | ICD-10-CM | POA: Diagnosis not present

## 2021-07-21 DIAGNOSIS — Z86010 Personal history of colonic polyps: Secondary | ICD-10-CM | POA: Diagnosis not present

## 2021-07-21 DIAGNOSIS — D123 Benign neoplasm of transverse colon: Secondary | ICD-10-CM

## 2021-07-21 DIAGNOSIS — B9681 Helicobacter pylori [H. pylori] as the cause of diseases classified elsewhere: Secondary | ICD-10-CM

## 2021-07-21 DIAGNOSIS — K635 Polyp of colon: Secondary | ICD-10-CM | POA: Diagnosis not present

## 2021-07-21 DIAGNOSIS — R194 Change in bowel habit: Secondary | ICD-10-CM

## 2021-07-21 DIAGNOSIS — K219 Gastro-esophageal reflux disease without esophagitis: Secondary | ICD-10-CM

## 2021-07-21 DIAGNOSIS — R1013 Epigastric pain: Secondary | ICD-10-CM

## 2021-07-21 DIAGNOSIS — K279 Peptic ulcer, site unspecified, unspecified as acute or chronic, without hemorrhage or perforation: Secondary | ICD-10-CM

## 2021-07-21 DIAGNOSIS — K2970 Gastritis, unspecified, without bleeding: Secondary | ICD-10-CM

## 2021-07-21 DIAGNOSIS — D122 Benign neoplasm of ascending colon: Secondary | ICD-10-CM

## 2021-07-21 HISTORY — DX: Peptic ulcer, site unspecified, unspecified as acute or chronic, without hemorrhage or perforation: B96.81

## 2021-07-21 HISTORY — DX: Peptic ulcer, site unspecified, unspecified as acute or chronic, without hemorrhage or perforation: K27.9

## 2021-07-21 MED ORDER — PANTOPRAZOLE SODIUM 40 MG PO TBEC: 40.0000 mg | DELAYED_RELEASE_TABLET | Freq: Two times a day (BID) | ORAL | 2 refills | Status: DC

## 2021-07-21 MED ORDER — SODIUM CHLORIDE 0.9 % IV SOLN: 500.0000 mL | Freq: Once | INTRAVENOUS | Status: DC

## 2021-07-21 NOTE — Op Note (Signed)
[facility] Patient Name: Tracy Perez Procedure Date: 07/21/2021 8:03 AM MRN: [id number] Endoscopist: Jerene Bears , MD Age: 55 Referring MD:  Date of Birth: 01-29-1966 Gender: Female Account #: [id number] Procedure:                Colonoscopy Indications:              Surveillance: Personal history of adenomatous                            polyps on last colonoscopy 5 years ago, Last                            colonoscopy: June 2017 Medicines:                Monitored Anesthesia Care Procedure:                Pre-Anesthesia Assessment:                           - Prior to the procedure, a History and Physical                            was performed, and patient medications and                            allergies were reviewed. The patient's tolerance of                            previous anesthesia was also reviewed. The risks                            and benefits of the procedure and the sedation                            options and risks were discussed with the patient.                            All questions were answered, and informed consent                            was obtained. Prior Anticoagulants: The patient has                            taken no previous anticoagulant or antiplatelet                            agents. ASA Grade Assessment: III - A patient with                            severe systemic disease. After reviewing the risks                            and benefits, the patient was deemed in  satisfactory condition to undergo the procedure.                           After obtaining informed consent, the colonoscope                            was passed under direct vision. Throughout the                            procedure, the patient's blood pressure, pulse, and                            oxygen saturations were monitored continuously. The                            PCF-HQ190L Colonoscope was introduced  through the                            anus and advanced to the cecum, identified by                            appendiceal orifice and ileocecal valve. The                            colonoscopy was performed without difficulty. The                            patient tolerated the procedure well. The quality                            of the bowel preparation was good. The ileocecal                            valve, appendiceal orifice, and rectum were                            photographed. Scope In: 8:26:30 AM Scope Out: 8:45:58 AM Scope Withdrawal Time: 0 hours 16 minutes 52 seconds  Total Procedure Duration: 0 hours 19 minutes 28 seconds  Findings:                 The digital rectal exam was normal.                           Two sessile polyps were found in the ascending                            colon. The polyps were 2 to 3 mm in size. These                            polyps were removed with a cold snare. Resection                            and retrieval were complete.  A 6 mm polyp was found in the transverse colon. The                            polyp was sessile. The polyp was removed with a                            cold snare. Resection and retrieval were complete.                           A 4 mm polyp was found in the proximal rectum. The                            polyp was sessile. The polyp was removed with a                            cold snare. Resection and retrieval were complete.                           Internal hemorrhoids were found during                            retroflexion. The hemorrhoids were small. Complications:            No immediate complications. Estimated Blood Loss:     Estimated blood loss was minimal. Impression:               - Two 2 to 3 mm polyps in the ascending colon,                            removed with a cold snare. Resected and retrieved.                           - One 6 mm polyp in the transverse  colon, removed                            with a cold snare. Resected and retrieved.                           - One 4 mm polyp in the proximal rectum, removed                            with a cold snare. Resected and retrieved.                           - Small internal hemorrhoids. Recommendation:           - Patient has a contact number available for                            emergencies. The signs and symptoms of potential                            delayed complications were discussed with the  patient. Return to normal activities tomorrow.                            Written discharge instructions were provided to the                            patient.                           - Resume previous diet.                           - Continue present medications.                           - Await pathology results.                           - Repeat colonoscopy is recommended for                            surveillance. The colonoscopy date will be                            determined after pathology results from today's                            exam become available for review. Jerene Bears, MD 07/21/2021 8:56:02 AM This report has been signed electronically.

## 2021-07-21 NOTE — Progress Notes (Signed)
GASTROENTEROLOGY PROCEDURE H&P NOTE   Primary Care Physician: Berkley Harvey, NP    Reason for Procedure:   GERD, epigastric pain, hx of colon polyps, change in bowel habits   Plan:    EGD/colon  Patient is appropriate for endoscopic procedure(s) in the ambulatory ([place]) setting.  The nature of the procedure, as well as the risks, benefits, and alternatives were carefully and thoroughly reviewed with the patient. Ample time for discussion and questions allowed. The patient understood, was satisfied, and agreed to proceed.     HPI: Tracy Perez is a 55 y.o. female who presents for egd/colon.  Hx as below.  No changes in hx since OV.  No complaints today with no cp, dyspnea.  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, Macrocalcifications identified / ER 100%, PR 85%   Colon polyps    Depression    Fibroids    GERD (gastroesophageal reflux disease)    H/O Doppler ultrasound 02/17/2016   left lower leg and right upper leg   HLD (hyperlipidemia)    Hypertension    Peripheral vascular disease (HCC)    S/P radiation therapy 03/06/2013-04/20/2013   Left Breast  /50.4Gy in 28 fractions/ Left Breast Boost / 10 Gy in 5 fractions   Use of tamoxifen (Nolvadex)     Past Surgical History:  Procedure Laterality Date   APPENDECTOMY     CERVICAL CONE BIOPSY  1987   CESAREAN SECTION      x 2   COLONOSCOPY     06-07 was normal in [place]   [place] N/A 02/18/2016   Procedure: [place];  Surgeon: Servando Salina, MD;  Location: [facility];  Service: Gynecology;  Laterality: N/A;   LEG SURGERY Left 1994   vascular   PARTIAL MASTECTOMY WITH NEEDLE LOCALIZATION Left 01/16/2013   Procedure: PARTIAL MASTECTOMY WITH NEEDLE LOCALIZATION;  Surgeon: Adin Hector, MD;   Location: [facility];  Service: General;  Laterality: Left;  Left partial mastectomy with needle localization at breast center of [place]     Prior to Admission medications   Medication Sig Start Date End Date Taking? Authorizing Provider  albuterol (VENTOLIN HFA) 108 (90 Base) MCG/ACT inhaler Inhale 2 puffs into the lungs every 6 (six) hours as needed for wheezing or shortness of breath.   Yes [provider]  amLODipine (NORVASC) 10 MG tablet Take 1 tablet by mouth daily. 05/28/20  Yes [provider]  chlorthalidone (HYGROTON) 25 MG tablet Take 12.5 mg by mouth daily. Reported on 04/28/2016   Yes [provider]  cholecalciferol (VITAMIN D) 1000 UNITS tablet Take 1,000 Units by mouth daily. Reported on 04/28/2016   Yes [provider]  escitalopram (LEXAPRO) 20 MG tablet Take 20 mg by mouth daily. 04/07/21  Yes [provider]  losartan (COZAAR) 50 MG tablet Take 50 mg by mouth daily.  12/24/13  Yes [provider]  potassium chloride (KLOR-CON) 10 MEQ tablet Take 10 mEq by mouth daily. 04/04/21  Yes [provider]  pravastatin (PRAVACHOL) 10 MG tablet Take 1 tablet by mouth daily. 06/10/21  Yes [provider]    Current Outpatient Medications  Medication Sig Dispense Refill   albuterol (VENTOLIN HFA) 108 (90 Base) MCG/ACT inhaler Inhale 2 puffs into  the lungs every 6 (six) hours as needed for wheezing or shortness of breath.     amLODipine (NORVASC) 10 MG tablet Take 1 tablet by mouth daily.     chlorthalidone (HYGROTON) 25 MG tablet Take 12.5 mg by mouth daily. Reported on 04/28/2016     cholecalciferol (VITAMIN D) 1000 UNITS tablet Take 1,000 Units by mouth daily. Reported on 04/28/2016     escitalopram (LEXAPRO) 20 MG tablet Take 20 mg by mouth daily.     losartan (COZAAR) 50 MG tablet Take 50 mg by mouth daily.      potassium chloride (KLOR-CON) 10 MEQ tablet Take 10 mEq by mouth daily.     pravastatin (PRAVACHOL)  10 MG tablet Take 1 tablet by mouth daily.     Current Facility-Administered Medications  Medication Dose Route Frequency Provider Last Rate Last Admin   0.9 %  sodium chloride infusion  500 mL Intravenous Once Martise Waddell, Lajuan Lines, MD        Allergies as of 07/21/2021 - Review Complete 07/21/2021  Allergen Reaction Noted   Gadolinium derivatives Nausea And Vomiting 01/24/2013    Family History  Problem Relation Age of Onset   Asthma Mother    Allergies Mother    Hypertension Mother    Anuerysm Mother        brain   Hyperlipidemia Mother    Cancer Father        type unknown   Allergies Daughter    Hypertension Daughter    Breast cancer Maternal Aunt    Arthritis Maternal Aunt    Bipolar disorder Maternal Aunt    Depression Maternal Aunt    Arthritis Maternal Aunt    Hypertension Son    Colon cancer Neg Hx    Colon polyps Neg Hx    Rectal cancer Neg Hx    Stomach cancer Neg Hx     Social History   Socioeconomic History   Marital status: Married    Spouse name: Not on file   Number of children: 3   Years of education: Not on file   Highest education level: Not on file  Occupational History   Occupation: Stage manager: [employer]  Tobacco Use   Smoking status: Former    Packs/day: 0.50    Years: 9.00    Pack years: 4.50    Types: Cigarettes    Quit date: 11/23/1989    Years since quitting: 31.6   Smokeless tobacco: Never  Vaping Use   Vaping Use: Never used  Substance and Sexual Activity   Alcohol use: Yes    Alcohol/week: 0.0 standard drinks    Comment: social   Drug use: No   Sexual activity: Yes    Birth control/protection: None    Comment: menarche age 62, G43, P40, 1st birth age 17  Other Topics Concern   Not on file  Social History Narrative   Not on file   Social Determinants of Health   Financial Resource Strain: Not on file  Food Insecurity: Not on file  Transportation Needs: Not on file  Physical Activity: Not on file  Stress: Not on  file  Social Connections: Not on file  Intimate Partner Violence: Not on file    Physical Exam: Vital signs in last 24 hours: '@BP'$  139/87   Pulse 78   Temp (!) 97.1 F (36.2 C)   Resp 12   Ht '5\' 7"'$  (1.702 m)   Wt 200 lb (90.7 kg)   SpO2  100%   BMI 31.32 kg/m  GEN: NAD EYE: Sclerae anicteric ENT: MMM CV: Non-tachycardic Pulm: CTA b/l GI: Soft, NT/ND NEURO:  Alert & Oriented x 3   Zenovia Jarred, MD [facility]  07/21/2021 8:08 AM

## 2021-07-21 NOTE — Progress Notes (Signed)
To PACU, VSS. Report to [email or web address] 

## 2021-07-21 NOTE — Patient Instructions (Signed)
Please read handouts provided. Continue present medications. Await pathology results. Begin pantoprazole 40 mg twice daily Before first and last meal of the day ) for 4 weeks, then once daily for 8 weeks. Avoid aspirin, ibuprofen, naproxen, or other non-steriodal anti-inflammatory drugs.   YOU HAD AN ENDOSCOPIC PROCEDURE TODAY AT [facility]:   Refer to the procedure report that was given to you for any specific questions about what was found during the examination.  If the procedure report does not answer your questions, please call your gastroenterologist to clarify.  If you requested that your care partner not be given the details of your procedure findings, then the procedure report has been included in a sealed envelope for you to review at your convenience later.  YOU SHOULD EXPECT: Some feelings of bloating in the abdomen. Passage of more gas than usual.  Walking can help get rid of the air that was put into your GI tract during the procedure and reduce the bloating. If you had a lower endoscopy (such as a colonoscopy or flexible sigmoidoscopy) you may notice spotting of blood in your stool or on the toilet paper. If you underwent a bowel prep for your procedure, you may not have a normal bowel movement for a few days.  Please Note:  You might notice some irritation and congestion in your nose or some drainage.  This is from the oxygen used during your procedure.  There is no need for concern and it should clear up in a day or so.  SYMPTOMS TO REPORT IMMEDIATELY:  Following lower endoscopy (colonoscopy or flexible sigmoidoscopy):  Excessive amounts of blood in the stool  Significant tenderness or worsening of abdominal pains  Swelling of the abdomen that is new, acute  Fever of 100F or higher  Following upper endoscopy (EGD)  Vomiting of blood or coffee ground material  New chest pain or pain under the shoulder blades  Painful or persistently difficult  swallowing  New shortness of breath  Fever of 100F or higher  Black, tarry-looking stools  For urgent or emergent issues, a gastroenterologist can be reached at any hour by calling [phone]. Do not use MyChart messaging for urgent concerns.    DIET:  We do recommend a small meal at first, but then you may proceed to your regular diet.  Drink plenty of fluids but you should avoid alcoholic beverages for 24 hours.  ACTIVITY:  You should plan to take it easy for the rest of today and you should NOT DRIVE or use heavy machinery until tomorrow (because of the sedation medicines used during the test).    FOLLOW UP: Our staff will call the number listed on your records 48-72 hours following your procedure to check on you and address any questions or concerns that you may have regarding the information given to you following your procedure. If we do not reach you, we will leave a message.  We will attempt to reach you two times.  During this call, we will ask if you have developed any symptoms of COVID 19. If you develop any symptoms (ie: fever, flu-like symptoms, shortness of breath, cough etc.) before then, please call [phone].  If you test positive for Covid 19 in the 2 weeks post procedure, please call and report this information to [place].    If any biopsies were taken you will be contacted by phone or by letter within the next 1-3 weeks.  Please call us at [phone] if you  have not heard about the biopsies in 3 weeks.    SIGNATURES/CONFIDENTIALITY: You and/or your care partner have signed paperwork which will be entered into your electronic medical record.  These signatures attest to the fact that that the information above on your After Visit Summary has been reviewed and is understood.  Full responsibility of the confidentiality of this discharge information lies with you and/or your care-partner.

## 2021-07-21 NOTE — Op Note (Signed)
[facility] Patient Name: Tracy Perez Procedure Date: 07/21/2021 8:04 AM MRN: [id number] Endoscopist: Jerene Bears , MD Age: 55 Referring MD:  Date of Birth: [birth year] Gender: Female Account #: [id number] Procedure:                Upper GI endoscopy Indications:              Epigastric abdominal pain, Suspected                            gastro-esophageal reflux disease Medicines:                Monitored Anesthesia Care Procedure:                Pre-Anesthesia Assessment:                           - Prior to the procedure, a History and Physical                            was performed, and patient medications and                            allergies were reviewed. The patient's tolerance of                            previous anesthesia was also reviewed. The risks                            and benefits of the procedure and the sedation                            options and risks were discussed with the patient.                            All questions were answered, and informed consent                            was obtained. Prior Anticoagulants: The patient has                            taken no previous anticoagulant or antiplatelet                            agents. ASA Grade Assessment: III - A patient with                            severe systemic disease. After reviewing the risks                            and benefits, the patient was deemed in                            satisfactory condition to undergo the procedure.  After obtaining informed consent, the endoscope was                            passed under direct vision. Throughout the                            procedure, the patient's blood pressure, pulse, and                            oxygen saturations were monitored continuously. The                            Endoscope was introduced through the mouth, and                            advanced to the second part of  duodenum. The upper                            GI endoscopy was accomplished without difficulty.                            The patient tolerated the procedure well. Scope In: Scope Out: Findings:                 The examined esophagus was normal.                           Two non-bleeding cratered gastric ulcers with no                            stigmata of bleeding were found in the gastric                            antrum. The largest lesion was 10 mm in largest                            dimension. Biopsies were taken with a cold forceps                            for histology.                           The cardia, gastric fundus, gastric body and                            incisura were normal. Biopsies were taken with a                            cold forceps for histology and Helicobacter pylori                            testing.                           The examined duodenum was normal. Complications:  No immediate complications. Estimated Blood Loss:     Estimated blood loss was minimal. Impression:               - Normal esophagus.                           - Non-bleeding gastric ulcers with no stigmata of                            bleeding. Biopsied.                           - Normal cardia, gastric fundus, gastric body and                            incisura. Biopsied.                           - Normal examined duodenum. Recommendation:           - Patient has a contact number available for                            emergencies. The signs and symptoms of potential                            delayed complications were discussed with the                            patient. Return to normal activities tomorrow.                            Written discharge instructions were provided to the                            patient.                           - Resume previous diet.                           - Continue present medications.                           -  Begin pantoprazole 40 mg twice daily (before 1st                            and last meal of the day) x 4 weeks, then once                            daily x 8 weeks for gastric ulcer.                           - Await pathology results.                           - Avoid aspirin, ibuprofen, naproxen, or  other                            non-steroidal anti-inflammatory drugs. Jerene Bears, MD 07/21/2021 8:52:58 AM This report has been signed electronically.

## 2021-07-21 NOTE — Progress Notes (Signed)
Pt's states no medical or surgical changes since previsit or office visit. 

## 2021-07-21 NOTE — Progress Notes (Signed)
Called to room to assist during endoscopic procedure.  Patient ID and intended procedure confirmed with present staff. Received instructions for my participation in the procedure from the performing physician.  

## 2021-07-21 NOTE — Progress Notes (Signed)
C.W. vital signs. 

## 2021-07-23 ENCOUNTER — Telehealth: Payer: Self-pay

## 2021-07-23 ENCOUNTER — Encounter: Payer: Self-pay | Admitting: Internal Medicine

## 2021-07-23 NOTE — Telephone Encounter (Signed)
  Follow up Call-  Call back number 07/21/2021  Post procedure Call Back phone  # [phone]  Permission to leave phone message Yes  Some recent data might be hidden     Patient questions:  Do you have a fever, pain , or abdominal swelling? No. Pain Score  0 *  Have you tolerated food without any problems? Yes.    Have you been able to return to your normal activities? Yes.    Do you have any questions about your discharge instructions: Diet   No. Medications  No. Follow up visit  No.  Do you have questions or concerns about your Care? No.  Actions: * If pain score is 4 or above: No action needed, pain <4.  Have you developed a fever since your procedure? no  2.   Have you had an respiratory symptoms (SOB or cough) since your procedure? no  3.   Have you tested positive for COVID 19 since your procedure no  4.   Have you had any family members/close contacts diagnosed with the COVID 19 since your procedure?  no   If yes to any of these questions please route to Joylene John, RN and Joella Prince, RN

## 2021-07-31 ENCOUNTER — Other Ambulatory Visit: Payer: Self-pay

## 2021-07-31 MED ORDER — PYLERA 140-125-125 MG PO CAPS: 3.0000 | ORAL_CAPSULE | Freq: Three times a day (TID) | ORAL | 0 refills | Status: DC

## 2021-08-01 ENCOUNTER — Telehealth: Payer: Self-pay | Admitting: Internal Medicine

## 2021-08-01 ENCOUNTER — Other Ambulatory Visit: Payer: Self-pay

## 2021-08-01 ENCOUNTER — Telehealth: Payer: Self-pay

## 2021-08-01 MED ORDER — PYLERA 140-125-125 MG PO CAPS: 3.0000 | ORAL_CAPSULE | Freq: Three times a day (TID) | ORAL | 0 refills | Status: DC

## 2021-08-01 NOTE — Telephone Encounter (Signed)
Left message for pt to call back  °

## 2021-08-01 NOTE — Progress Notes (Signed)
Pt stated that the [facility] pharmacy that she uses did not have medication in stock. Prescription was to the [facility] at [place]. Pt made aware

## 2021-08-04 ENCOUNTER — Telehealth: Payer: Self-pay | Admitting: Internal Medicine

## 2021-08-04 NOTE — Telephone Encounter (Signed)
Patient questions whether she can take Pylera with meals as she says her script tells her to take before meals and this upsets her stomach. I advised her to take after meals instead. She also notes dark stool. I advised that Pylera contains bismuth which can cause dark or green colored stool. I did caution her that if the stool becomes tarry looking, she should let us know. She verbalizes understanding of this information.

## 2021-08-04 NOTE — Telephone Encounter (Signed)
Inbound call from requesting call please.  Has questions about Pylera medication.

## 2021-08-05 MED ORDER — PYLERA 140-125-125 MG PO CAPS: 3.0000 | ORAL_CAPSULE | Freq: Three times a day (TID) | ORAL | 0 refills | Status: DC

## 2021-08-05 NOTE — Telephone Encounter (Signed)
Solved

## 2021-08-12 ENCOUNTER — Telehealth: Payer: Self-pay | Admitting: Internal Medicine

## 2021-08-12 NOTE — Telephone Encounter (Signed)
Pt states that she can no longer take her Pylera due to the fact that is experiencing head aches, lower back pain and nausea from the medication. Pt states that she has took 9 days work and unable to take the last day. Please advise

## 2021-08-12 NOTE — Telephone Encounter (Signed)
Very likely she has taken enough of the abx for benefit She can not take the last day We will confirm eradication of infection at her followup EGD in Dec 2022

## 2021-08-12 NOTE — Telephone Encounter (Signed)
Pls call pt, She has to give you an update on her progress taking Pylera.

## 2021-08-12 NOTE — Telephone Encounter (Signed)
Pt notified of Dr. Hilarie Fredrickson recommendations. Pt verbalized understanding

## 2021-09-09 ENCOUNTER — Encounter: Payer: Self-pay | Admitting: *Deleted

## 2021-09-26 ENCOUNTER — Encounter: Payer: Self-pay | Admitting: Internal Medicine

## 2021-09-26 ENCOUNTER — Other Ambulatory Visit: Payer: Self-pay

## 2021-09-26 ENCOUNTER — Ambulatory Visit (AMBULATORY_SURGERY_CENTER): Payer: Self-pay | Admitting: *Deleted

## 2021-09-26 VITALS — Ht 67.5 in | Wt 209.0 lb

## 2021-09-26 DIAGNOSIS — K253 Acute gastric ulcer without hemorrhage or perforation: Secondary | ICD-10-CM

## 2021-09-26 NOTE — Progress Notes (Signed)

## 2021-10-24 ENCOUNTER — Encounter (HOSPITAL_BASED_OUTPATIENT_CLINIC_OR_DEPARTMENT_OTHER): Payer: Self-pay | Admitting: *Deleted

## 2021-10-24 ENCOUNTER — Other Ambulatory Visit: Payer: Self-pay

## 2021-10-24 ENCOUNTER — Emergency Department (HOSPITAL_BASED_OUTPATIENT_CLINIC_OR_DEPARTMENT_OTHER)
Admission: EM | Admit: 2021-10-24 | Discharge: 2021-10-24 | Disposition: A | Discharge status: Home / Self Care | Payer: BC Managed Care – PPO | Attending: Emergency Medicine | Admitting: Emergency Medicine

## 2021-10-24 ENCOUNTER — Emergency Department (HOSPITAL_BASED_OUTPATIENT_CLINIC_OR_DEPARTMENT_OTHER): Payer: BC Managed Care – PPO

## 2021-10-24 DIAGNOSIS — Z853 Personal history of malignant neoplasm of breast: Secondary | ICD-10-CM | POA: Insufficient documentation

## 2021-10-24 DIAGNOSIS — M79605 Pain in left leg: Secondary | ICD-10-CM | POA: Diagnosis present

## 2021-10-24 DIAGNOSIS — Z79899 Other long term (current) drug therapy: Secondary | ICD-10-CM | POA: Insufficient documentation

## 2021-10-24 DIAGNOSIS — I10 Essential (primary) hypertension: Secondary | ICD-10-CM | POA: Diagnosis not present

## 2021-10-24 DIAGNOSIS — Z87891 Personal history of nicotine dependence: Secondary | ICD-10-CM | POA: Diagnosis not present

## 2021-10-24 DIAGNOSIS — J45909 Unspecified asthma, uncomplicated: Secondary | ICD-10-CM | POA: Diagnosis not present

## 2021-10-24 MED ORDER — KETOROLAC TROMETHAMINE 60 MG/2ML IM SOLN
30.0000 mg | Freq: Once | INTRAMUSCULAR | Status: AC
  Administered 2021-10-24: 30 mg via INTRAMUSCULAR
  Filled 2021-10-24: qty 2

## 2021-10-24 MED ORDER — IBUPROFEN 600 MG PO TABS: 600.0000 mg | ORAL_TABLET | Freq: Four times a day (QID) | ORAL | 0 refills | Status: DC | PRN

## 2021-10-24 MED ORDER — METHOCARBAMOL 500 MG PO TABS: 500.0000 mg | ORAL_TABLET | Freq: Two times a day (BID) | ORAL | 0 refills | Status: DC

## 2021-10-24 MED ORDER — LIDOCAINE 4 % EX PTCH: 1.0000 | MEDICATED_PATCH | Freq: Every day | CUTANEOUS | 0 refills | Status: DC | PRN

## 2021-10-24 NOTE — ED Triage Notes (Signed)
Left upper pain x 1 week  denies inj  pain increased w pressure

## 2021-10-24 NOTE — Discharge Instructions (Addendum)
You were seen here today for further evaluation of your left leg pain.  You had a negative ultrasound and x-ray.  Your exam was overall reassuring.  I have prescribed you ibuprofen and a muscle laxer to take as needed for pain.  Please follow-up with your PCP for reevaluation of this problem.  If you have any concern, new or worsening symptoms, please return to the nearest emergency department for reevaluation.

## 2021-10-24 NOTE — ED Notes (Signed)
ED Provider at bedside. 

## 2021-10-24 NOTE — ED Notes (Signed)
Pt transported to [facility]

## 2021-10-24 NOTE — ED Provider Notes (Signed)
[facility] Provider Note   CSN: [id number] Arrival date & time: 10/24/21  1937     History Chief Complaint  Patient presents with   Leg Pain    Tracy Perez is a 55 y.o. female with history of hypertension, hyperlipidemia presents emergency department with left leg pain gradually worsening for over a week.  She reports pain is "pulling" and throbbing and is affecting the medial and anterior portion of her upper left leg/thigh.  She reports it feels it is coming from the medial portion of her neck and radiating upwards.  She reports its worsened with palpation, and only mildly worsened with walking.  She denies any traumas, falls, MVC's to the area.  Denies any overlying skin changes or swelling.  She reports she had an left lower leg's "circulation" surgery over 30 years ago does not know for what.  No records found on care everywhere.  She denies any chest pain, shortness of breath, abdominal pain, nausea, vomiting, lower leg swelling, rashes, or numbness, tingling.  Surgical history includes left lower leg "circulation" surgery, breast cancer, 2 cesareans, and appendectomy.  Allergic to MRI dye.  She denies any tobacco, EtOH, or drug use.   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, Macrocalcifications identified / ER 100%, PR 85%   Colon polyps    Depression    Fibroids    GERD (gastroesophageal reflux disease)    H pylori ulcer 07/21/2021   H/O Doppler ultrasound 02/17/2016   left lower leg and right upper leg   HLD (hyperlipidemia)    Hypertension    Peripheral vascular disease (HCC)    S/P radiation therapy 03/06/2013-04/20/2013   Left Breast  /50.4Gy in 28 fractions/ Left Breast Boost / 10 Gy in 5 fractions   Use of tamoxifen  (Nolvadex)     Patient Active Problem List   Diagnosis Date Noted   Screening for colon cancer 02/21/2016   Mucus in stool 07/25/2015   Cough variant asthma 05/23/2014   Upper airway cough syndrome 04/23/2014   Depressive disorder 02/20/2014   Generalized anxiety disorder 02/20/2014   Essential hypertension 02/20/2014   Hyperlipidemia 02/20/2014   Personal history of tobacco use, presenting hazards to health 02/20/2014   Vitamin D deficiency 05/25/2013   Agoraphobia with panic disorder 04/29/2013   Attention deficit hyperactivity disorder (ADHD) 04/29/2013   Esophageal reflux 04/29/2013   Cancer of central portion of left female breast ([place]) 02/09/2013   Hypertrophy of uterus 01/07/2012   Anemia 09/03/2011    Past Surgical History:  Procedure Laterality Date   APPENDECTOMY     CERVICAL CONE BIOPSY  1987   CESAREAN SECTION      x 2   COLONOSCOPY     06-07 was normal in [place]   [place] N/A 02/18/2016   Procedure: DILATATION & CURETTAGE/HYSTEROSCOPY WITH MYOSURE;  Surgeon: Servando Salina, MD;  Location: [facility];  Service: Gynecology;  Laterality: N/A;   LEG SURGERY Left 1994   vascular   PARTIAL MASTECTOMY WITH NEEDLE LOCALIZATION Left 01/16/2013   Procedure: PARTIAL MASTECTOMY WITH NEEDLE LOCALIZATION;  Surgeon: Adin Hector, MD;  Location: [facility];  Service: General;  Laterality: Left;  Left partial mastectomy with needle localization at [facility] center of [facility]       OB History     Gravida  2   Para  2   Term      Preterm      AB      Living         SAB      IAB      Ectopic      Multiple      Live Births           Obstetric Comments   menarche at 56, premenopause, G3P3 age first at first birth 73         Family History  Problem Relation Age of Onset   Asthma Mother    Allergies Mother    Hypertension Mother    Anuerysm Mother         brain   Hyperlipidemia Mother    Cancer Father        type unknown   Colon polyps Brother    Colon polyps Maternal Aunt    Breast cancer Maternal Aunt    Arthritis Maternal Aunt    Bipolar disorder Maternal Aunt    Depression Maternal Aunt    Arthritis Maternal Aunt    Allergies Daughter    Hypertension Daughter    Hypertension Son    Colon cancer Neg Hx    Rectal cancer Neg Hx    Stomach cancer Neg Hx    Esophageal cancer Neg Hx     Social History   Tobacco Use   Smoking status: Former    Packs/day: 0.50    Years: 9.00    Pack years: 4.50    Types: Cigarettes    Quit date: 11/23/1989    Years since quitting: 31.9   Smokeless tobacco: Never  Vaping Use   Vaping Use: Never used  Substance Use Topics   Alcohol use: Yes    Alcohol/week: 0.0 standard drinks    Comment: social   Drug use: No    Home Medications Prior to Admission medications   Medication Sig Start Date End Date Taking? Authorizing Provider  ibuprofen (ADVIL) 600 MG tablet Take 1 tablet (600 mg total) by mouth every 6 (six) hours as needed. 10/24/21  Yes Sherrell Puller, PA-C  Lidocaine (HM LIDOCAINE PATCH) 4 % PTCH Apply 1 patch topically daily as needed. 10/24/21  Yes Sherrell Puller, PA-C  methocarbamol (ROBAXIN) 500 MG tablet Take 1 tablet (500 mg total) by mouth 2 (two) times daily. 10/24/21  Yes Sherrell Puller, PA-C  albuterol (VENTOLIN HFA) 108 (90 Base) MCG/ACT inhaler Inhale 2 puffs into the lungs every 6 (six) hours as needed for wheezing or shortness of breath.    [provider]  amLODipine (NORVASC) 10 MG tablet Take 1 tablet by mouth daily. 05/28/20   [provider]  chlorthalidone (HYGROTON) 25 MG tablet Take 12.5 mg by mouth daily. Reported on 04/28/2016    [provider]  cholecalciferol (VITAMIN D) 1000 UNITS tablet Take 1,000 Units by mouth daily. Reported on 04/28/2016    [provider]  escitalopram (LEXAPRO) 20 MG tablet Take 20 mg by mouth daily. 04/07/21    [provider]  losartan (COZAAR) 100 MG tablet Take 100 mg by mouth daily. 09/24/21   [provider]  losartan (COZAAR) 50 MG tablet Take 50 mg by mouth daily.  Patient not taking: Reported on 09/26/2021 12/24/13  [provider]  pantoprazole (PROTONIX) 40 MG tablet Take 1 tablet (40 mg total) by mouth 2 (two) times daily before a meal. Pantoprazole 40 mg twice daily ( before first and last meal of the day ) for 4 weeks, then once daily for 8 weeks. 07/21/21   Pyrtle, Lajuan Lines, MD  potassium chloride (KLOR-CON) 10 MEQ tablet Take 10 mEq by mouth daily. 04/04/21   [provider]  Prasterone [place] Re[place]) 6.5 MG INST  01/24/20   [provider]  pravastatin (PRAVACHOL) 10 MG tablet Take 1 tablet by mouth daily. 06/10/21   [provider]    Allergies    Iodinated diagnostic agents, Prednisolone, Gadobutrol, Gadolinium derivatives, and Gadoversetamide  Review of Systems   Review of Systems  Constitutional:  Negative for chills and fever.  HENT:  Negative for ear pain and sore throat.   Eyes:  Negative for pain and visual disturbance.  Respiratory:  Negative for cough and shortness of breath.   Cardiovascular:  Negative for chest pain, palpitations and leg swelling.  Gastrointestinal:  Negative for abdominal pain and vomiting.  Genitourinary:  Negative for dysuria and hematuria.  Musculoskeletal:  Positive for myalgias. Negative for arthralgias, back pain, gait problem and joint swelling.  Skin:  Negative for color change and rash.  Neurological:  Negative for seizures and syncope.  All other systems reviewed and are negative.  Physical Exam Updated Vital Signs BP (!) 148/74 (BP Location: Right Arm)   Pulse 81   Temp 98.3 F (36.8 C) (Oral)   Resp 16   Ht 5\' 7"  (1.702 m)   Wt 95.3 kg   LMP 02/10/2013   SpO2 99%   BMI 32.89 kg/m   Physical Exam Vitals and nursing note reviewed.  Constitutional:      General: She is not in acute  distress.    Appearance: Normal appearance. She is not toxic-appearing.  HENT:     Head: Normocephalic and atraumatic.  Eyes:     General: No scleral icterus. Cardiovascular:     Rate and Rhythm: Normal rate and regular rhythm.  Pulmonary:     Effort: Pulmonary effort is normal. No respiratory distress.     Breath sounds: Normal breath sounds.  Abdominal:     General: Bowel sounds are normal.     Palpations: Abdomen is soft.     Tenderness: There is no abdominal tenderness. There is no guarding or rebound.  Musculoskeletal:        General: No swelling or deformity. Normal range of motion.     Cervical back: Normal range of motion.     Right lower leg: No edema.     Left lower leg: No edema.     Comments: BACK -no cervical, thoracic, lumbar, or sacral paraspinal or midline tenderness.  No obvious deformities or step-offs noted.  No overlying skin changes noted.  LLE -no overlying skin changes, rashes, erythema, ecchymosis, or abrasion noted to the left lower extremity.  Faint surgical incision noticed to the lateral left lower leg.  Compartments soft.  Strong DP and PT pulses.  Cap refill approximately 2 seconds.  Sensation intact.  Patient is tender to touch to the medial portion of her anterior left thigh.  No palpable muscle spasms or masses.  Full range of motion.  Patient is ambulatory in the emergency department.  Strength intact.  Skin:    General: Skin is warm and dry.  Neurological:     General: No focal deficit present.  Mental Status: She is alert. Mental status is at baseline.     Sensory: No sensory deficit.     Motor: No weakness.     Gait: Gait normal.    ED Results / Procedures / Treatments   Labs (all labs ordered are listed, but only abnormal results are displayed) Labs Reviewed - No data to display  EKG None  Radiology US Venous Img Lower Unilateral Left  Result Date: 10/24/2021 CLINICAL DATA:  Acute left lower extremity pain. EXAM: Left LOWER  EXTREMITY VENOUS DOPPLER ULTRASOUND TECHNIQUE: Gray-scale sonography with compression, as well as color and duplex ultrasound, were performed to evaluate the deep venous system(s) from the level of the common femoral vein through the popliteal and proximal calf veins. COMPARISON:  None. FINDINGS: VENOUS Normal compressibility of the common femoral, superficial femoral, and popliteal veins, as well as the visualized calf veins. Visualized portions of profunda femoral vein and great saphenous vein unremarkable. No filling defects to suggest DVT on grayscale or color Doppler imaging. Doppler waveforms show normal direction of venous flow, normal respiratory plasticity and response to augmentation. Limited views of the contralateral common femoral vein are unremarkable. OTHER None. Limitations: none IMPRESSION: Negative. Electronically Signed   By: Marijo Conception M.D.   On: 10/24/2021 14:08   DG Femur Min 2 Views Left  Result Date: 10/24/2021 CLINICAL DATA:  LEFT mid thigh pain. EXAM: LEFT FEMUR 2 VIEWS COMPARISON:  None FINDINGS: There is no evidence of fracture or other focal bone lesions. Soft tissues are unremarkable. IMPRESSION: Negative evaluation of the LEFT femur. Electronically Signed   By: Zetta Bills M.D.   On: 10/24/2021 15:41     Procedures Procedures   Medications Ordered in ED Medications  ketorolac (TORADOL) injection 30 mg (30 mg Intramuscular Given 10/24/21 1622)    ED Course  I have reviewed the triage vital signs and the nursing notes.  Pertinent labs & imaging results that were available during my care of the patient were reviewed by me and considered in my medical decision making (see chart for details).  55 year old patient with "circulatory" issue of the left leg presents to the emergency department for evaluation of left upper leg pain for over a week.  Differential diagnosis includes but is not limited to DVT, arterial blockage, MSK, sciatica, deep space infection.  On  physical exam, patient has some tenderness palpation of the medial anterior aspect of her left thigh, otherwise unremarkable.  No overlying skin changes noted.  No swelling or deformity.  DP and PT pulses palpable.  Compartments soft.  Sensation intact.  Full range of motion.  Given patient's history of cancer and the "circulation" issue, will ultrasound leg to rule out DVT.  Additionally will order plain film of femur to rule out any deep space infection or air producing bacteria.  I personally reviewed the patient's imaging.  No DVT seen on ultrasound.  Femur shows no bony lesions or fracture.  Soft tissues are unremarkable.  Unknown etiology of her leg pain, however have ruled out any air producing bacteria, fracture, bony lesion, or DVT. Most likely MSK in nature.  Will prescribe patient Robaxin, topical lidocaine, and ibuprofen.  I recommended unknown etiology of her she follow-up with her PCP within the week for reevaluation.  I recommended she not take the Robaxin if she is going to be driving or operating heavy machinery.  Strict return precautions given.  Patient agrees to plan.  Patient is stable being discharged home in good condition.  MDM Rules/Calculators/A&P                          Final Clinical Impression(s) / ED Diagnoses Final diagnoses:  Left leg pain    Rx / DC Orders ED Discharge Orders          Ordered    methocarbamol (ROBAXIN) 500 MG tablet  2 times daily        10/24/21 1601    ibuprofen (ADVIL) 600 MG tablet  Every 6 hours PRN        10/24/21 1601    Lidocaine (HM LIDOCAINE PATCH) 4 % PTCH  Daily PRN        10/24/21 1601             Sherrell Puller, PA-C 10/28/21 2037    Davonna Belling, MD 10/30/21 2252

## 2021-10-24 NOTE — ED Notes (Signed)
Pt returned from u/s

## 2021-10-27 ENCOUNTER — Telehealth: Payer: Self-pay | Admitting: Internal Medicine

## 2021-10-27 NOTE — Telephone Encounter (Signed)
ED Friday for leg pain- gave muscle relaxer's and Ibuprofen- ? Will this interfere with Colon  Informed no issues with procedure   Pt verbalized understanding

## 2021-11-03 ENCOUNTER — Encounter: Payer: Self-pay | Admitting: Internal Medicine

## 2021-11-03 ENCOUNTER — Ambulatory Visit (AMBULATORY_SURGERY_CENTER): Payer: BC Managed Care – PPO | Admitting: Internal Medicine

## 2021-11-03 ENCOUNTER — Other Ambulatory Visit: Payer: Self-pay

## 2021-11-03 VITALS — BP 137/77 | HR 84 | Temp 97.8°F | Resp 12 | Ht 67.0 in | Wt 209.0 lb

## 2021-11-03 DIAGNOSIS — K259 Gastric ulcer, unspecified as acute or chronic, without hemorrhage or perforation: Secondary | ICD-10-CM

## 2021-11-03 DIAGNOSIS — B9681 Helicobacter pylori [H. pylori] as the cause of diseases classified elsewhere: Secondary | ICD-10-CM

## 2021-11-03 DIAGNOSIS — K2970 Gastritis, unspecified, without bleeding: Secondary | ICD-10-CM | POA: Diagnosis not present

## 2021-11-03 DIAGNOSIS — K2950 Unspecified chronic gastritis without bleeding: Secondary | ICD-10-CM | POA: Diagnosis not present

## 2021-11-03 MED ORDER — SODIUM CHLORIDE 0.9 % IV SOLN: 500.0000 mL | INTRAVENOUS | Status: DC

## 2021-11-03 NOTE — Progress Notes (Signed)
When pt was waking up in the recovery room.  She began coughing. up Clear mucus.  Pt's sats remained 97 - 100 % during coughing episodes.  Dr. Hilarie Fredrickson was in the go over findings with pt and her daughter, he said pt had  mucous on her vocal cords.  I assured pt and her daughter her sats were very good.  I encouaged pt to slow her respirati ons and slow, deep breaths.  Pt was gagging.  No vomiting noted.  Once pt slowed her breathing she  settled down.  maw

## 2021-11-03 NOTE — Op Note (Signed)
[facility] Patient Name: Tracy Perez Procedure Date: 11/03/2021 8:29 AM MRN: [id number] Endoscopist: Jerene Bears , MD Age: 55 Referring MD:  Date of Birth: 15-Apr-1966 Gender: Female Account #: [id number] Procedure:                Upper GI endoscopy Indications:              Previously treated for Helicobacter pylori (pt                            completed 50% of therapy due to side-effects of the                            antibiotics), Follow-up of acute gastric ulcer seen                            at EGD in late Aug 2022 Medicines:                Monitored Anesthesia Care Procedure:                Pre-Anesthesia Assessment:                           - Prior to the procedure, a History and Physical                            was performed, and patient medications and                            allergies were reviewed. The patient's tolerance of                            previous anesthesia was also reviewed. The risks                            and benefits of the procedure and the sedation                            options and risks were discussed with the patient.                            All questions were answered, and informed consent                            was obtained. Prior Anticoagulants: The patient has                            taken no previous anticoagulant or antiplatelet                            agents. ASA Grade Assessment: II - A patient with                            mild systemic disease. After reviewing the risks  and benefits, the patient was deemed in                            satisfactory condition to undergo the procedure.                           After obtaining informed consent, the endoscope was                            passed under direct vision. Throughout the                            procedure, the patient's blood pressure, pulse, and                            oxygen saturations were  monitored continuously. The                            Endoscope was introduced through the mouth, and                            advanced to the second part of duodenum. The upper                            GI endoscopy was accomplished without difficulty.                            The patient tolerated the procedure well. Scope In: Scope Out: Findings:                 The examined esophagus was normal.                           Scattered mild inflammation characterized by                            erythema was found in the gastric fundus, in the                            gastric body and in the gastric antrum. Biopsies                            were taken with a cold forceps for histology and                            Helicobacter pylori testing.                           The examined duodenum was normal. Complications:            No immediate complications. Estimated Blood Loss:     Estimated blood loss was minimal. Impression:               - Normal esophagus.                           -  Gastritis. Biopsied for confirmation of H. Pylori                            eradication. Previously seen gastric ulcers have                            healed.                           - Normal examined duodenum. Recommendation:           - Patient has a contact number available for                            emergencies. The signs and symptoms of potential                            delayed complications were discussed with the                            patient. Return to normal activities tomorrow.                            Written discharge instructions were provided to the                            patient.                           - Resume previous diet.                           - Continue present medications. Avoid NSAIDs.                           - Await pathology results. Jerene Bears, MD 11/03/2021 8:47:07 AM This report has been signed electronically.

## 2021-11-03 NOTE — Progress Notes (Signed)
Report given to PACU, vss 

## 2021-11-03 NOTE — Patient Instructions (Addendum)
Handout was given to your care partner on Gastritis. NO ASPIRIN, ASPIRIN CONTAINING PRODUCTS (BC OR GOODY POWDERS) OR NSAIDS (IBUPROFEN, ADVIL, ALEVE, AND MOTRIN) ; TYLENOL IS OK TO TAKE. You may resume your other current medications today. Await biopsy results.  May take 1-3 weeks to receive pathology results. A work note was given to your daughter for you. Please call if any questions or concerns.      YOU HAD AN ENDOSCOPIC PROCEDURE TODAY AT [facility]:   Refer to the procedure report that was given to you for any specific questions about what was found during the examination.  If the procedure report does not answer your questions, please call your gastroenterologist to clarify.  If you requested that your care partner not be given the details of your procedure findings, then the procedure report has been included in a sealed envelope for you to review at your convenience later.  YOU SHOULD EXPECT: Some feelings of bloating in the abdomen. Passage of more gas than usual.  Walking can help get rid of the air that was put into your GI tract during the procedure and reduce the bloating. If you had a lower endoscopy (such as a colonoscopy or flexible sigmoidoscopy) you may notice spotting of blood in your stool or on the toilet paper. If you underwent a bowel prep for your procedure, you may not have a normal bowel movement for a few days.  Please Note:  You might notice some irritation and congestion in your nose or some drainage.  This is from the oxygen used during your procedure.  There is no need for concern and it should clear up in a day or so.  SYMPTOMS TO REPORT IMMEDIATELY:  Following upper endoscopy (EGD)  Vomiting of blood or coffee ground material  New chest pain or pain under the shoulder blades  Painful or persistently difficult swallowing  New shortness of breath  Fever of 100F or higher  Black, tarry-looking stools  For urgent or emergent issues, a  gastroenterologist can be reached at any hour by calling [phone]. Do not use MyChart messaging for urgent concerns.    DIET:  We do recommend a small meal at first, but then you may proceed to your regular diet.  Drink plenty of fluids but you should avoid alcoholic beverages for 24 hours.  ACTIVITY:  You should plan to take it easy for the rest of today and you should NOT DRIVE or use heavy machinery until tomorrow (because of the sedation medicines used during the test).    FOLLOW UP: Our staff will call the number listed on your records 48-72 hours following your procedure to check on you and address any questions or concerns that you may have regarding the information given to you following your procedure. If we do not reach you, we will leave a message.  We will attempt to reach you two times.  During this call, we will ask if you have developed any symptoms of COVID 19. If you develop any symptoms (ie: fever, flu-like symptoms, shortness of breath, cough etc.) before then, please call [phone].  If you test positive for Covid 19 in the 2 weeks post procedure, please call and report this information to [place].    If any biopsies were taken you will be contacted by phone or by letter within the next 1-3 weeks.  Please call us at [phone] if you have not heard about the biopsies in 3 weeks.  SIGNATURES/CONFIDENTIALITY: You and/or your care partner have signed paperwork which will be entered into your electronic medical record.  These signatures attest to the fact that that the information above on your After Visit Summary has been reviewed and is understood.  Full responsibility of the confidentiality of this discharge information lies with you and/or your care-partner.

## 2021-11-03 NOTE — Progress Notes (Signed)
Pt's states no medical or surgical changes since previsit or office visit. 

## 2021-11-03 NOTE — Progress Notes (Signed)
Called to room to assist during endoscopic procedure.  Patient ID and intended procedure confirmed with present staff. Received instructions for my participation in the procedure from the performing physician.  

## 2021-11-03 NOTE — Progress Notes (Signed)
0832 Robinul 0.1 mg IV given due large amount of secretions upon assessment.  MD made aware, vss  

## 2021-11-03 NOTE — Progress Notes (Signed)
GASTROENTEROLOGY PROCEDURE H&P NOTE   Primary Care Physician: Berkley Harvey, NP    Reason for Procedure:  Follow-up H. pylori gastritis with gastric ulcers  Plan:    EGD  Patient is appropriate for endoscopic procedure(s) in the ambulatory ([place]) setting.  The nature of the procedure, as well as the risks, benefits, and alternatives were carefully and thoroughly reviewed with the patient. Ample time for discussion and questions allowed. The patient understood, was satisfied, and agreed to proceed.     HPI: Tracy Perez is a 55 y.o. female who presents for EGD for surveillance after treatment for H. pylori induced gastric ulcers.  No recent chest pain or shortness of breath.  Medical history as below.  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, Macrocalcifications identified / ER 100%, PR 85%   Colon polyps    Depression    Fibroids    GERD (gastroesophageal reflux disease)    H pylori ulcer 07/21/2021   H/O Doppler ultrasound 02/17/2016   left lower leg and right upper leg   HLD (hyperlipidemia)    Hypertension    Peripheral vascular disease (HCC)    S/P radiation therapy 03/06/2013-04/20/2013   Left Breast  /50.4Gy in 28 fractions/ Left Breast Boost / 10 Gy in 5 fractions   Use of tamoxifen (Nolvadex)     Past Surgical History:  Procedure Laterality Date   APPENDECTOMY     CERVICAL CONE BIOPSY  1987   CESAREAN SECTION      x 2   COLONOSCOPY     06-07 was normal in [place]   [place] N/A 02/18/2016   Procedure: [place];  Surgeon: Servando Salina, MD;  Location: [facility];  Service: Gynecology;  Laterality: N/A;   LEG SURGERY Left 1994   vascular   PARTIAL MASTECTOMY WITH NEEDLE LOCALIZATION Left 01/16/2013   Procedure: PARTIAL  MASTECTOMY WITH NEEDLE LOCALIZATION;  Surgeon: Adin Hector, MD;  Location: [facility];  Service: General;  Laterality: Left;  Left partial mastectomy with needle localization at [facility] center of [facility]      Prior to Admission medications   Medication Sig Start Date End Date Taking? Authorizing Provider  amLODipine (NORVASC) 10 MG tablet Take 1 tablet by mouth daily. 05/28/20  Yes [provider]  chlorthalidone (HYGROTON) 25 MG tablet Take 12.5 mg by mouth daily. Reported on 04/28/2016   Yes [provider]  cholecalciferol (VITAMIN D) 1000 UNITS tablet Take 1,000 Units by mouth daily. Reported on 04/28/2016   Yes [provider]  escitalopram (LEXAPRO) 20 MG tablet Take 20 mg by mouth daily. 04/07/21  Yes [provider]  losartan (COZAAR) 100 MG tablet Take 100 mg by mouth daily. 09/24/21  Yes [provider]  pantoprazole (PROTONIX) 40 MG tablet Take 1 tablet (40 mg total) by mouth 2 (two) times daily before a meal. Pantoprazole 40 mg twice daily ( before first and last meal of the day ) for 4 weeks, then once daily for 8 weeks. 07/21/21  Yes Shakira Los, Lajuan Lines, MD  potassium chloride (KLOR-CON) 10 MEQ tablet Take 10 mEq by mouth daily. 04/04/21  Yes [provider]  pravastatin (PRAVACHOL) 10 MG tablet Take 1 tablet  by mouth daily. 06/10/21  Yes [provider]  albuterol (VENTOLIN HFA) 108 (90 Base) MCG/ACT inhaler Inhale 2 puffs into the lungs every 6 (six) hours as needed for wheezing or shortness of breath.    [provider]  ibuprofen (ADVIL) 600 MG tablet Take 1 tablet (600 mg total) by mouth every 6 (six) hours as needed. 10/24/21   Sherrell Puller, PA-C  Lidocaine (HM LIDOCAINE PATCH) 4 % PTCH Apply 1 patch topically daily as needed. Patient not taking: Reported on 11/03/2021 10/24/21   Sherrell Puller, PA-C  losartan (COZAAR) 50 MG tablet Take 50 mg by mouth daily.  Patient not  taking: Reported on 09/26/2021 12/24/13   [provider]  methocarbamol (ROBAXIN) 500 MG tablet Take 1 tablet (500 mg total) by mouth 2 (two) times daily. Patient not taking: Reported on 11/03/2021 10/24/21   Sherrell Puller, PA-C  Prasterone [place]ulton Reek) 6.5 MG INST  01/24/20   [provider]    Current Outpatient Medications  Medication Sig Dispense Refill   amLODipine (NORVASC) 10 MG tablet Take 1 tablet by mouth daily.     chlorthalidone (HYGROTON) 25 MG tablet Take 12.5 mg by mouth daily. Reported on 04/28/2016     cholecalciferol (VITAMIN D) 1000 UNITS tablet Take 1,000 Units by mouth daily. Reported on 04/28/2016     escitalopram (LEXAPRO) 20 MG tablet Take 20 mg by mouth daily.     losartan (COZAAR) 100 MG tablet Take 100 mg by mouth daily.     pantoprazole (PROTONIX) 40 MG tablet Take 1 tablet (40 mg total) by mouth 2 (two) times daily before a meal. Pantoprazole 40 mg twice daily ( before first and last meal of the day ) for 4 weeks, then once daily for 8 weeks. 60 tablet 2   potassium chloride (KLOR-CON) 10 MEQ tablet Take 10 mEq by mouth daily.     pravastatin (PRAVACHOL) 10 MG tablet Take 1 tablet by mouth daily.     albuterol (VENTOLIN HFA) 108 (90 Base) MCG/ACT inhaler Inhale 2 puffs into the lungs every 6 (six) hours as needed for wheezing or shortness of breath.     ibuprofen (ADVIL) 600 MG tablet Take 1 tablet (600 mg total) by mouth every 6 (six) hours as needed. 30 tablet 0   Lidocaine (HM LIDOCAINE PATCH) 4 % PTCH Apply 1 patch topically daily as needed. (Patient not taking: Reported on 11/03/2021) 1 patch 0   losartan (COZAAR) 50 MG tablet Take 50 mg by mouth daily.  (Patient not taking: Reported on 09/26/2021)     methocarbamol (ROBAXIN) 500 MG tablet Take 1 tablet (500 mg total) by mouth 2 (two) times daily. (Patient not taking: Reported on 11/03/2021) 20 tablet 0   Prasterone (INTRAROSA) 6.5 MG INST  (Patient not taking: Reported on 11/03/2021)     Current  Facility-Administered Medications  Medication Dose Route Frequency Provider Last Rate Last Admin   0.9 %  sodium chloride infusion  500 mL Intravenous Continuous Taryne Kiger, Lajuan Lines, MD        Allergies as of 11/03/2021 - Review Complete 11/03/2021  Allergen Reaction Noted   Iodinated diagnostic agents Nausea And Vomiting 04/12/2014   Prednisolone Other (See Comments) 04/12/2014   Gadobutrol Nausea And Vomiting 01/24/2013   Gadolinium derivatives Nausea And Vomiting 01/24/2013   Gadoversetamide Nausea And Vomiting 01/24/2013    Family History  Problem Relation Age of Onset   Asthma Mother    Allergies Mother    Hypertension Mother  Anuerysm Mother        brain   Hyperlipidemia Mother    Cancer Father        type unknown   Colon polyps Brother    Colon polyps Maternal Aunt    Breast cancer Maternal Aunt    Arthritis Maternal Aunt    Bipolar disorder Maternal Aunt    Depression Maternal Aunt    Arthritis Maternal Aunt    Allergies Daughter    Hypertension Daughter    Hypertension Son    Colon cancer Neg Hx    Rectal cancer Neg Hx    Stomach cancer Neg Hx    Esophageal cancer Neg Hx     Social History   Socioeconomic History   Marital status: Married    Spouse name: Not on file   Number of children: 3   Years of education: Not on file   Highest education level: Not on file  Occupational History   Occupation: Stage manager: [employer]  Tobacco Use   Smoking status: Former    Packs/day: 0.50    Years: 9.00    Pack years: 4.50    Types: Cigarettes    Quit date: 11/23/1989    Years since quitting: 31.9   Smokeless tobacco: Never  Vaping Use   Vaping Use: Never used  Substance and Sexual Activity   Alcohol use: Yes    Alcohol/week: 0.0 standard drinks    Comment: social   Drug use: No   Sexual activity: Yes    Birth control/protection: None    Comment: menarche age 35, G64, P46, 1st birth age 59  Other Topics Concern   Not on file  Social History  Narrative   Not on file   Social Determinants of Health   Financial Resource Strain: Not on file  Food Insecurity: Not on file  Transportation Needs: Not on file  Physical Activity: Not on file  Stress: Not on file  Social Connections: Not on file  Intimate Partner Violence: Not on file    Physical Exam: Vital signs in last 24 hours: @BP  (!) 132/91   Pulse 74   Temp 97.8 F (36.6 C) (Temporal)   Resp 16   Ht 5\' 7"  (1.702 m)   Wt 209 lb (94.8 kg)   LMP 02/10/2013   SpO2 100%   BMI 32.73 kg/m  GEN: NAD EYE: Sclerae anicteric ENT: MMM CV: Non-tachycardic Pulm: CTA b/l GI: Soft, NT/ND NEURO:  Alert & Oriented x 3   Zenovia Jarred, MD [facility]  11/03/2021 8:34 AM

## 2021-11-05 ENCOUNTER — Telehealth: Payer: Self-pay | Admitting: *Deleted

## 2021-11-05 NOTE — Telephone Encounter (Signed)
°  Follow up Call-  Call back number 11/03/2021 07/21/2021  Post procedure Call Back phone  # [phone]  Permission to leave phone message Yes Yes  Some recent data might be hidden     Patient questions:  Do you have a fever, pain , or abdominal swelling? No. Pain Score  0 *  Have you tolerated food without any problems? Yes.    Have you been able to return to your normal activities? Yes.    Do you have any questions about your discharge instructions: Diet   No. Medications  No. Follow up visit  No.  Do you have questions or concerns about your Care? No.  Actions: * If pain score is 4 or above: No action needed, pain <4.  Have you developed a fever since your procedure? no  2.   Have you had an respiratory symptoms (SOB or cough) since your procedure? no  3.   Have you tested positive for COVID 19 since your procedure no  4.   Have you had any family members/close contacts diagnosed with the COVID 19 since your procedure?  no   If yes to any of these questions please route to Joylene John, RN and Joella Prince, RN

## 2021-11-05 NOTE — Telephone Encounter (Signed)
°  Follow up Call-  Call back number 11/03/2021 07/21/2021  Post procedure Call Back phone  # [phone]  Permission to leave phone message Yes Yes  Some recent data might be hidden   [facility]

## 2021-11-10 ENCOUNTER — Other Ambulatory Visit: Payer: Self-pay

## 2021-11-10 ENCOUNTER — Encounter: Payer: Self-pay | Admitting: Internal Medicine

## 2021-11-10 DIAGNOSIS — K259 Gastric ulcer, unspecified as acute or chronic, without hemorrhage or perforation: Secondary | ICD-10-CM

## 2021-11-10 DIAGNOSIS — B9681 Helicobacter pylori [H. pylori] as the cause of diseases classified elsewhere: Secondary | ICD-10-CM

## 2021-11-10 MED ORDER — TALICIA 250-12.5-10 MG PO CPDR: 4.0000 | DELAYED_RELEASE_CAPSULE | Freq: Three times a day (TID) | ORAL | 0 refills | Status: AC

## 2021-12-12 ENCOUNTER — Telehealth: Payer: Self-pay | Admitting: Internal Medicine

## 2021-12-12 NOTE — Telephone Encounter (Signed)
Patient called states she finished her [place] medication and she would like to know what the next steps are.

## 2021-12-12 NOTE — Telephone Encounter (Signed)
Spoke with pt regarding next steps since she has completed her [place] prescription today. Pt verbalized understanding that she will need to come in for a stool study 8 weeks from when she started taking medication, and stop taking PPI two weeks prior to stool sample. No further questions.

## 2022-01-27 ENCOUNTER — Other Ambulatory Visit: Payer: BC Managed Care – PPO

## 2022-01-27 DIAGNOSIS — B9681 Helicobacter pylori [H. pylori] as the cause of diseases classified elsewhere: Secondary | ICD-10-CM

## 2022-01-27 DIAGNOSIS — K259 Gastric ulcer, unspecified as acute or chronic, without hemorrhage or perforation: Secondary | ICD-10-CM

## 2022-01-29 LAB — H. PYLORI ANTIGEN, STOOL: H pylori Ag, Stl: NEGATIVE

## 2022-01-31 ENCOUNTER — Other Ambulatory Visit: Payer: Self-pay | Admitting: Internal Medicine

## 2022-02-05 ENCOUNTER — Inpatient Hospital Stay (HOSPITAL_BASED_OUTPATIENT_CLINIC_OR_DEPARTMENT_OTHER)
Admission: EM | Admit: 2022-02-05 | Discharge: 2022-02-10 | DRG: 872 | Disposition: A | Discharge status: Home / Self Care | Payer: BC Managed Care – PPO | Attending: Family Medicine | Admitting: Family Medicine

## 2022-02-05 ENCOUNTER — Other Ambulatory Visit: Payer: Self-pay

## 2022-02-05 ENCOUNTER — Encounter (HOSPITAL_BASED_OUTPATIENT_CLINIC_OR_DEPARTMENT_OTHER): Payer: Self-pay | Admitting: *Deleted

## 2022-02-05 ENCOUNTER — Emergency Department (HOSPITAL_BASED_OUTPATIENT_CLINIC_OR_DEPARTMENT_OTHER): Payer: BC Managed Care – PPO

## 2022-02-05 DIAGNOSIS — N2889 Other specified disorders of kidney and ureter: Secondary | ICD-10-CM

## 2022-02-05 DIAGNOSIS — Z8371 Family history of colonic polyps: Secondary | ICD-10-CM

## 2022-02-05 DIAGNOSIS — Z91041 Radiographic dye allergy status: Secondary | ICD-10-CM

## 2022-02-05 DIAGNOSIS — T148XXA Other injury of unspecified body region, initial encounter: Secondary | ICD-10-CM | POA: Diagnosis present

## 2022-02-05 DIAGNOSIS — Z825 Family history of asthma and other chronic lower respiratory diseases: Secondary | ICD-10-CM

## 2022-02-05 DIAGNOSIS — J45909 Unspecified asthma, uncomplicated: Secondary | ICD-10-CM | POA: Diagnosis present

## 2022-02-05 DIAGNOSIS — Z79899 Other long term (current) drug therapy: Secondary | ICD-10-CM

## 2022-02-05 DIAGNOSIS — F32A Depression, unspecified: Secondary | ICD-10-CM | POA: Diagnosis present

## 2022-02-05 DIAGNOSIS — S37019A Minor contusion of unspecified kidney, initial encounter: Secondary | ICD-10-CM | POA: Diagnosis present

## 2022-02-05 DIAGNOSIS — A419 Sepsis, unspecified organism: Principal | ICD-10-CM

## 2022-02-05 DIAGNOSIS — Z20822 Contact with and (suspected) exposure to covid-19: Secondary | ICD-10-CM | POA: Diagnosis present

## 2022-02-05 DIAGNOSIS — Z6832 Body mass index (BMI) 32.0-32.9, adult: Secondary | ICD-10-CM

## 2022-02-05 DIAGNOSIS — Z803 Family history of malignant neoplasm of breast: Secondary | ICD-10-CM

## 2022-02-05 DIAGNOSIS — E669 Obesity, unspecified: Secondary | ICD-10-CM | POA: Diagnosis present

## 2022-02-05 DIAGNOSIS — Z83438 Family history of other disorder of lipoprotein metabolism and other lipidemia: Secondary | ICD-10-CM

## 2022-02-05 DIAGNOSIS — D72829 Elevated white blood cell count, unspecified: Secondary | ICD-10-CM

## 2022-02-05 DIAGNOSIS — I10 Essential (primary) hypertension: Secondary | ICD-10-CM | POA: Diagnosis present

## 2022-02-05 DIAGNOSIS — S37012A Minor contusion of left kidney, initial encounter: Principal | ICD-10-CM

## 2022-02-05 DIAGNOSIS — Z888 Allergy status to other drugs, medicaments and biological substances status: Secondary | ICD-10-CM

## 2022-02-05 DIAGNOSIS — Z8249 Family history of ischemic heart disease and other diseases of the circulatory system: Secondary | ICD-10-CM

## 2022-02-05 DIAGNOSIS — Z923 Personal history of irradiation: Secondary | ICD-10-CM

## 2022-02-05 DIAGNOSIS — N390 Urinary tract infection, site not specified: Secondary | ICD-10-CM | POA: Diagnosis present

## 2022-02-05 DIAGNOSIS — Z87891 Personal history of nicotine dependence: Secondary | ICD-10-CM

## 2022-02-05 DIAGNOSIS — E785 Hyperlipidemia, unspecified: Secondary | ICD-10-CM | POA: Diagnosis present

## 2022-02-05 DIAGNOSIS — Z853 Personal history of malignant neoplasm of breast: Secondary | ICD-10-CM

## 2022-02-05 DIAGNOSIS — Z9012 Acquired absence of left breast and nipple: Secondary | ICD-10-CM

## 2022-02-05 LAB — CBC
HCT: 41.5 % (ref 36.0–46.0)
Hemoglobin: 13.5 g/dL (ref 12.0–15.0)
MCH: 29.2 pg (ref 26.0–34.0)
MCHC: 32.5 g/dL (ref 30.0–36.0)
MCV: 89.8 fL (ref 80.0–100.0)
Platelets: 295 10*3/uL (ref 150–400)
RBC: 4.62 MIL/uL (ref 3.87–5.11)
RDW: 12.4 % (ref 11.5–15.5)
WBC: 18 10*3/uL — ABNORMAL HIGH (ref 4.0–10.5)
nRBC: 0 % (ref 0.0–0.2)

## 2022-02-05 LAB — COMPREHENSIVE METABOLIC PANEL
ALT: 21 U/L (ref 0–44)
AST: 28 U/L (ref 15–41)
Albumin: 4.7 g/dL (ref 3.5–5.0)
Alkaline Phosphatase: 68 U/L (ref 38–126)
Anion gap: 12 (ref 5–15)
BUN: 13 mg/dL (ref 6–20)
CO2: 26 mmol/L (ref 22–32)
Calcium: 9.4 mg/dL (ref 8.9–10.3)
Chloride: 101 mmol/L (ref 98–111)
Creatinine, Ser: 0.92 mg/dL (ref 0.44–1.00)
GFR, Estimated: >60 mL/min (ref >60)
Glucose, Bld: 159 mg/dL — ABNORMAL HIGH (ref 70–99)
Potassium: 3.5 mmol/L (ref 3.5–5.1)
Sodium: 139 mmol/L (ref 135–145)
Total Bilirubin: 0.8 mg/dL (ref 0.3–1.2)
Total Protein: 8 g/dL (ref 6.5–8.1)

## 2022-02-05 LAB — URINALYSIS, ROUTINE W REFLEX MICROSCOPIC
Bilirubin Urine: NEGATIVE
Glucose, UA: NEGATIVE mg/dL
Ketones, ur: 15 mg/dL — AB
Leukocytes,Ua: NEGATIVE
Nitrite: NEGATIVE
Protein, ur: 100 mg/dL — AB
Specific Gravity, Urine: 1.025 (ref 1.005–1.030)
pH: 7 (ref 5.0–8.0)

## 2022-02-05 LAB — URINALYSIS, MICROSCOPIC (REFLEX)

## 2022-02-05 LAB — PREGNANCY, URINE: Preg Test, Ur: NEGATIVE

## 2022-02-05 LAB — LIPASE, BLOOD: Lipase: 33 U/L (ref 11–51)

## 2022-02-05 MED ORDER — ONDANSETRON 4 MG PO TBDP
4.0000 mg | ORAL_TABLET | Freq: Once | ORAL | Status: AC | PRN
Start: 2022-02-05 — End: 2022-02-05
  Administered 2022-02-05: 4 mg via ORAL
  Filled 2022-02-05: qty 1

## 2022-02-05 MED ORDER — ONDANSETRON HCL 4 MG/2ML IJ SOLN
4.0000 mg | Freq: Once | INTRAMUSCULAR | Status: AC
  Administered 2022-02-05: 4 mg via INTRAVENOUS
  Filled 2022-02-05: qty 2

## 2022-02-05 MED ORDER — KETOROLAC TROMETHAMINE 15 MG/ML IJ SOLN
30.0000 mg | Freq: Once | INTRAMUSCULAR | Status: AC
  Administered 2022-02-05: 30 mg via INTRAVENOUS
  Filled 2022-02-05: qty 2

## 2022-02-05 MED ORDER — KETOROLAC TROMETHAMINE 15 MG/ML IJ SOLN: 15.0000 mg | Freq: Once | INTRAMUSCULAR | Status: DC

## 2022-02-05 MED ORDER — SODIUM CHLORIDE 0.9 % IV BOLUS
500.0000 mL | Freq: Once | INTRAVENOUS | Status: AC
  Administered 2022-02-05: 500 mL via INTRAVENOUS

## 2022-02-05 NOTE — ED Triage Notes (Signed)
Vomiting since this afternoon. Left abdominal pain. She feels constipated. She has not taken laxatives or stool softeners.  ?

## 2022-02-05 NOTE — ED Provider Notes (Signed)
?[facility] EMERGENC[facility] DEPARTMENT ?Provider Note ? ? ?CSN: [id number] ?Arrival date & time: 02/05/22  1819 ? ?  ? ?History ? ?Chief Complaint  ?Patient presents with  ? Emesis  ? ? ?Tracy Perez is a 56 y.o. female who presents emergency department with left-sided flank pain starting yesterday.  Patient states that the pain radiates to her left abdomen.  She also began having vomiting due to the pain today.  She says that she feels constipated, has not taken any laxatives or stool softeners.  She denies any fever, chills, diarrhea, urinary symptoms. No trauma to the back. She is not on chronic anticoagulation.  ? ? ?Emesis ?Associated symptoms: abdominal pain   ?Associated symptoms: no chills, no diarrhea and no fever   ? ?  ? ?Home Medications ?Prior to Admission medications   ?Medication Sig Start Date End Date Taking? Authorizing Provider  ?albuterol (VENTOLIN HFA) 108 (90 Base) MCG/ACT inhaler Inhale 2 puffs into the lungs every 6 (six) hours as needed for wheezing or shortness of breath.    [provider]  ?amLODipine (NORVASC) 10 MG tablet Take 1 tablet by mouth daily. 05/28/20   [provider]  ?chlorthalidone (HYGROTON) 25 MG tablet Take 12.5 mg by mouth daily. Reported on 04/28/2016    [provider]  ?cholecalciferol (VITAMIN D) 1000 UNITS tablet Take 1,000 Units by mouth daily. Reported on 04/28/2016    [provider]  ?escitalopram (LEXAPRO) 20 MG tablet Take 20 mg by mouth daily. 04/07/21   [provider]  ?ibuprofen (ADVIL) 600 MG tablet Take 1 tablet (600 mg total) by mouth every 6 (six) hours as needed. 10/24/21   Sherrell Puller, PA-C  ?Lidocaine (HM LIDOCAINE PATCH) 4 % PTCH Apply 1 patch topically daily as needed. ?Patient not taking: Reported on 11/03/2021 10/24/21   Sherrell Puller, PA-C  ?losartan (COZAAR) 100 MG tablet Take 100 mg by mouth daily. 09/24/21   [provider]  ?losartan (COZAAR) 50 MG tablet Take 50 mg by mouth daily.   ?Patient not taking: Reported on 09/26/2021 12/24/13   [provider]  ?methocarbamol (ROBAXIN) 500 MG tablet Take 1 tablet (500 mg total) by mouth 2 (two) times daily. ?Patient not taking: Reported on 11/03/2021 10/24/21   Sherrell Puller, PA-C  ?pantoprazole (PROTONIX) 40 MG tablet Take 1 tablet (40 mg total) by mouth 2 (two) times daily before a meal. Pantoprazole 40 mg twice daily ( before first and last meal of the day ) for 4 weeks, then once daily for 8 weeks. 07/21/21   Pyrtle, Lajuan Lines, MD  ?potassium chloride (KLOR-CON) 10 MEQ tablet Take 10 mEq by mouth daily. 04/04/21   [provider]  ?Prasterone Fulton Reek) 6.5 MG INST  01/24/20   [provider]  ?pravastatin (PRAVACHOL) 10 MG tablet Take 1 tablet by mouth daily. 06/10/21   [provider]  ?   ? ?Allergies    ?Iodinated contrast media, Prednisolone, Gadobutrol, Gadolinium derivatives, and Gadoversetamide   ? ?Review of Systems   ?Review of Systems  ?Constitutional:  Negative for chills and fever.  ?Respiratory:  Negative for shortness of breath.   ?Cardiovascular:  Negative for chest pain.  ?Gastrointestinal:  Positive for abdominal pain, constipation, nausea and vomiting. Negative for diarrhea.  ?Genitourinary:  Positive for flank pain.  ?All other systems reviewed and are negative. ? ?Physical Exam ?Updated Vital Signs ?BP 133/86 (BP Location: Right Arm)   Pulse (!) 102   Temp 98.2 ?F (36.8 ?C) (  Oral)   Resp 18   Ht '5\' 7"'$  (1.702 m)   Wt 94.8 kg   LMP 02/10/2013   SpO2 97%   BMI 32.73 kg/m?  ?Physical Exam ?Vitals and nursing note reviewed.  ?Constitutional:   ?   Appearance: Normal appearance.  ?HENT:  ?   Head: Normocephalic and atraumatic.  ?Eyes:  ?   Conjunctiva/sclera: Conjunctivae normal.  ?Cardiovascular:  ?   Rate and Rhythm: Normal rate and regular rhythm.  ?Pulmonary:  ?   Effort: Pulmonary effort is normal. No respiratory distress.  ?   Breath sounds: Normal breath sounds.  ?Abdominal:  ?   General: There  is no distension.  ?   Palpations: Abdomen is soft.  ?   Tenderness: There is no abdominal tenderness. There is left CVA tenderness. There is no right CVA tenderness or guarding.  ?Skin: ?   General: Skin is warm and dry.  ?Neurological:  ?   General: No focal deficit present.  ?   Mental Status: She is alert.  ? ? ?ED Results / Procedures / Treatments   ?Labs ?(all labs ordered are listed, but only abnormal results are displayed) ?Labs Reviewed  ?COMPREHENSIVE METABOLIC PANEL - Abnormal; Notable for the following components:  ?    Result Value  ? Glucose, Bld 159 (*)   ? All other components within normal limits  ?CBC - Abnormal; Notable for the following components:  ? WBC 18.0 (*)   ? All other components within normal limits  ?URINALYSIS, ROUTINE W REFLEX MICROSCOPIC - Abnormal; Notable for the following components:  ? Hgb urine dipstick MODERATE (*)   ? Ketones, ur 15 (*)   ? Protein, ur 100 (*)   ? All other components within normal limits  ?URINALYSIS, MICROSCOPIC (REFLEX) - Abnormal; Notable for the following components:  ? Bacteria, UA FEW (*)   ? All other components within normal limits  ?LIPASE, BLOOD  ?PREGNANCY, URINE  ? ? ?EKG ?None ? ?Radiology ?CT Renal Stone Study ? ?Result Date: 02/05/2022 ?CLINICAL DATA:  Left abdominal pain, flank pain EXAM: CT ABDOMEN AND PELVIS WITHOUT CONTRAST TECHNIQUE: Multidetector CT imaging of the abdomen and pelvis was performed following the standard protocol without IV contrast. RADIATION DOSE REDUCTION: This exam was performed according to the departmental dose-optimization program which includes automated exposure control, adjustment of the mA and/or kV according to patient size and/or use of iterative reconstruction technique. COMPARISON:  None. FINDINGS: Lower chest: Linear atelectasis in the lung bases.  No effusions. Hepatobiliary: No focal hepatic abnormality. Gallbladder unremarkable. Pancreas: No focal abnormality or ductal dilatation. Spleen: No focal  abnormality.  Normal size. Adrenals/Urinary Tract: Adrenal glands and right kidney unremarkable. There is a subcapsular left perinephric hematoma measuring up to 1.9 cm in thickness. Exophytic left upper pole renal mass noted which is possibly the source of the perinephric hematoma. This measures 3.2 cm and cannot be characterized on this noncontrast study. No hydronephrosis. Urinary bladder unremarkable. Stomach/Bowel: Stomach, large and small bowel grossly unremarkable. Vascular/Lymphatic: No evidence of aneurysm or adenopathy. Reproductive: Enlarged fibroid uterus. Large exophytic fibroid off the left fundus measuring up to 7.6 cm. Other: No free fluid or free air. Musculoskeletal: No acute bony abnormality. IMPRESSION: Subcapsular left perinephric hematoma measuring up to 1.9 cm in thickness. There is a left upper pole exophytic mass measuring 3.2 cm which is difficult to characterize on this noncontrast study. This could be the source of the hematoma. No hydronephrosis. Fibroid uterus. Electronically Signed   By:  Rolm Baptise M.D.   On: 02/05/2022 23:11   ? ?Procedures ?Procedures  ? ? ?Medications Ordered in ED ?Medications  ?ondansetron (ZOFRAN-ODT) disintegrating tablet 4 mg (4 mg Oral Given 02/05/22 1832)  ?sodium chloride 0.9 % bolus 500 mL (0 mLs Intravenous Stopped 02/05/22 2250)  ?ondansetron [facility]) injection 4 mg (4 mg Intravenous Given 02/05/22 2144)  ?ketorolac (TORADOL) 15 MG/ML injection 30 mg (30 mg Intravenous Given 02/05/22 2144)  ? ? ?ED Course/ Medical Decision Making/ A&P ?  ?                        ?Medical Decision Making ?Amount and/or Complexity of Data Reviewed ?Labs: ordered. ?Radiology: ordered. ? ?Risk ?Prescription drug management. ? ? ?This patient presents to the ED for concern of flank pain, this involves an extensive number of treatment options, and is a complaint that carries with it a high risk of complications and morbidity. The emergent differential diagnosis prior to  evaluation includes, but is not limited to,  Abdominal aortic aneurysm, Renal artery embolism, Renal vein thrombosis, Aortic dissection, Mesenteric ischemia, Pyelonephritis, Renal infarction, Renal hemorrhage, Nephrolithiasis

## 2022-02-06 ENCOUNTER — Encounter (HOSPITAL_COMMUNITY): Payer: Self-pay | Admitting: Internal Medicine

## 2022-02-06 DIAGNOSIS — E669 Obesity, unspecified: Secondary | ICD-10-CM | POA: Diagnosis not present

## 2022-02-06 DIAGNOSIS — Z825 Family history of asthma and other chronic lower respiratory diseases: Secondary | ICD-10-CM | POA: Diagnosis not present

## 2022-02-06 DIAGNOSIS — Z923 Personal history of irradiation: Secondary | ICD-10-CM | POA: Diagnosis not present

## 2022-02-06 DIAGNOSIS — A419 Sepsis, unspecified organism: Secondary | ICD-10-CM | POA: Diagnosis not present

## 2022-02-06 DIAGNOSIS — Z9012 Acquired absence of left breast and nipple: Secondary | ICD-10-CM | POA: Diagnosis not present

## 2022-02-06 DIAGNOSIS — F32A Depression, unspecified: Secondary | ICD-10-CM | POA: Diagnosis not present

## 2022-02-06 DIAGNOSIS — N2889 Other specified disorders of kidney and ureter: Secondary | ICD-10-CM | POA: Diagnosis not present

## 2022-02-06 DIAGNOSIS — T148XXA Other injury of unspecified body region, initial encounter: Secondary | ICD-10-CM | POA: Diagnosis present

## 2022-02-06 DIAGNOSIS — I10 Essential (primary) hypertension: Secondary | ICD-10-CM | POA: Diagnosis not present

## 2022-02-06 DIAGNOSIS — Z83438 Family history of other disorder of lipoprotein metabolism and other lipidemia: Secondary | ICD-10-CM | POA: Diagnosis not present

## 2022-02-06 DIAGNOSIS — Z888 Allergy status to other drugs, medicaments and biological substances status: Secondary | ICD-10-CM | POA: Diagnosis not present

## 2022-02-06 DIAGNOSIS — Z8249 Family history of ischemic heart disease and other diseases of the circulatory system: Secondary | ICD-10-CM | POA: Diagnosis not present

## 2022-02-06 DIAGNOSIS — E785 Hyperlipidemia, unspecified: Secondary | ICD-10-CM | POA: Diagnosis not present

## 2022-02-06 DIAGNOSIS — Z20822 Contact with and (suspected) exposure to covid-19: Secondary | ICD-10-CM | POA: Diagnosis not present

## 2022-02-06 DIAGNOSIS — J45909 Unspecified asthma, uncomplicated: Secondary | ICD-10-CM | POA: Diagnosis not present

## 2022-02-06 DIAGNOSIS — Z853 Personal history of malignant neoplasm of breast: Secondary | ICD-10-CM | POA: Diagnosis not present

## 2022-02-06 DIAGNOSIS — S37019A Minor contusion of unspecified kidney, initial encounter: Secondary | ICD-10-CM | POA: Diagnosis present

## 2022-02-06 DIAGNOSIS — Z79899 Other long term (current) drug therapy: Secondary | ICD-10-CM | POA: Diagnosis not present

## 2022-02-06 DIAGNOSIS — D72829 Elevated white blood cell count, unspecified: Secondary | ICD-10-CM

## 2022-02-06 DIAGNOSIS — Z87891 Personal history of nicotine dependence: Secondary | ICD-10-CM | POA: Diagnosis not present

## 2022-02-06 DIAGNOSIS — N390 Urinary tract infection, site not specified: Secondary | ICD-10-CM | POA: Diagnosis not present

## 2022-02-06 DIAGNOSIS — Z6832 Body mass index (BMI) 32.0-32.9, adult: Secondary | ICD-10-CM | POA: Diagnosis not present

## 2022-02-06 DIAGNOSIS — Z8371 Family history of colonic polyps: Secondary | ICD-10-CM | POA: Diagnosis not present

## 2022-02-06 DIAGNOSIS — Z803 Family history of malignant neoplasm of breast: Secondary | ICD-10-CM | POA: Diagnosis not present

## 2022-02-06 DIAGNOSIS — Z91041 Radiographic dye allergy status: Secondary | ICD-10-CM | POA: Diagnosis not present

## 2022-02-06 LAB — CBC
HCT: 40.1 % (ref 36.0–46.0)
HCT: 39.3 % (ref 36.0–46.0)
Hemoglobin: 13 g/dL (ref 12.0–15.0)
Hemoglobin: 12.6 g/dL (ref 12.0–15.0)
MCH: 29.5 pg (ref 26.0–34.0)
MCH: 29.1 pg (ref 26.0–34.0)
MCHC: 32.4 g/dL (ref 30.0–36.0)
MCHC: 32.1 g/dL (ref 30.0–36.0)
MCV: 91.1 fL (ref 80.0–100.0)
MCV: 90.8 fL (ref 80.0–100.0)
Platelets: 278 10*3/uL (ref 150–400)
Platelets: 276 10*3/uL (ref 150–400)
RBC: 4.4 MIL/uL (ref 3.87–5.11)
RBC: 4.33 MIL/uL (ref 3.87–5.11)
RDW: 12.5 % (ref 11.5–15.5)
RDW: 12.4 % (ref 11.5–15.5)
WBC: 14.4 10*3/uL — ABNORMAL HIGH (ref 4.0–10.5)
WBC: 14.3 10*3/uL — ABNORMAL HIGH (ref 4.0–10.5)
nRBC: 0 % (ref 0.0–0.2)
nRBC: 0 % (ref 0.0–0.2)

## 2022-02-06 LAB — HEPATIC FUNCTION PANEL
ALT: 17 U/L (ref 0–44)
AST: 22 U/L (ref 15–41)
Albumin: 3.9 g/dL (ref 3.5–5.0)
Alkaline Phosphatase: 51 U/L (ref 38–126)
Bilirubin, Direct: 0.1 mg/dL (ref 0.0–0.2)
Indirect Bilirubin: 0.7 mg/dL (ref 0.3–0.9)
Total Bilirubin: 0.8 mg/dL (ref 0.3–1.2)
Total Protein: 6.7 g/dL (ref 6.5–8.1)

## 2022-02-06 LAB — BASIC METABOLIC PANEL
Anion gap: 8 (ref 5–15)
BUN: 13 mg/dL (ref 6–20)
CO2: 23 mmol/L (ref 22–32)
Calcium: 8.7 mg/dL — ABNORMAL LOW (ref 8.9–10.3)
Chloride: 105 mmol/L (ref 98–111)
Creatinine, Ser: 0.65 mg/dL (ref 0.44–1.00)
GFR, Estimated: >60 mL/min (ref >60)
Glucose, Bld: 108 mg/dL — ABNORMAL HIGH (ref 70–99)
Potassium: 3.2 mmol/L — ABNORMAL LOW (ref 3.5–5.1)
Sodium: 136 mmol/L (ref 135–145)

## 2022-02-06 LAB — RESP PANEL BY RT-PCR (FLU A&B, COVID) ARPGX2
Influenza A by PCR: NEGATIVE
Influenza B by PCR: NEGATIVE
SARS Coronavirus 2 by RT PCR: NEGATIVE

## 2022-02-06 LAB — TYPE AND SCREEN
ABO/RH(D): O NEG
Antibody Screen: NEGATIVE

## 2022-02-06 LAB — MAGNESIUM: Magnesium: 2.1 mg/dL (ref 1.7–2.4)

## 2022-02-06 LAB — ABO/RH: ABO/RH(D): O NEG

## 2022-02-06 MED ORDER — HYDRALAZINE HCL 20 MG/ML IJ SOLN: 10.0000 mg | INTRAMUSCULAR | Status: DC | PRN

## 2022-02-06 MED ORDER — LOSARTAN POTASSIUM 50 MG PO TABS
100.0000 mg | ORAL_TABLET | Freq: Every day | ORAL | Status: DC
  Administered 2022-02-06 – 2022-02-10 (×5): 100 mg via ORAL
  Filled 2022-02-06 (×5): qty 2

## 2022-02-06 MED ORDER — HYDROCODONE-ACETAMINOPHEN 5-325 MG PO TABS
1.0000 | ORAL_TABLET | ORAL | Status: DC | PRN
  Administered 2022-02-06 – 2022-02-08 (×7): 1 via ORAL
  Administered 2022-02-09: 2 via ORAL
  Administered 2022-02-10: 1 via ORAL
  Filled 2022-02-06: qty 1
  Filled 2022-02-06: qty 2
  Filled 2022-02-06 (×7): qty 1

## 2022-02-06 MED ORDER — ACETAMINOPHEN 650 MG RE SUPP: 650.0000 mg | Freq: Four times a day (QID) | RECTAL | Status: DC | PRN

## 2022-02-06 MED ORDER — MORPHINE SULFATE (PF) 2 MG/ML IV SOLN
1.0000 mg | INTRAVENOUS | Status: DC | PRN
  Administered 2022-02-06 (×2): 1 mg via INTRAVENOUS
  Filled 2022-02-06 (×2): qty 1

## 2022-02-06 MED ORDER — FENTANYL CITRATE PF 50 MCG/ML IJ SOSY
50.0000 ug | PREFILLED_SYRINGE | Freq: Once | INTRAMUSCULAR | Status: AC
  Administered 2022-02-06: 50 ug via INTRAVENOUS
  Filled 2022-02-06: qty 1

## 2022-02-06 MED ORDER — POTASSIUM CHLORIDE CRYS ER 20 MEQ PO TBCR
40.0000 meq | EXTENDED_RELEASE_TABLET | ORAL | Status: AC
  Administered 2022-02-06 (×2): 40 meq via ORAL
  Filled 2022-02-06 (×2): qty 2

## 2022-02-06 MED ORDER — LACTATED RINGERS IV SOLN: INTRAVENOUS | Status: DC

## 2022-02-06 MED ORDER — PRAVASTATIN SODIUM 20 MG PO TABS
10.0000 mg | ORAL_TABLET | Freq: Every day | ORAL | Status: DC
  Administered 2022-02-06 – 2022-02-10 (×5): 10 mg via ORAL
  Filled 2022-02-06 (×5): qty 1

## 2022-02-06 MED ORDER — ACETAMINOPHEN 325 MG PO TABS
650.0000 mg | ORAL_TABLET | Freq: Four times a day (QID) | ORAL | Status: DC | PRN
  Administered 2022-02-06 – 2022-02-08 (×4): 650 mg via ORAL
  Filled 2022-02-06 (×5): qty 2

## 2022-02-06 MED ORDER — AMLODIPINE BESYLATE 10 MG PO TABS
10.0000 mg | ORAL_TABLET | Freq: Every day | ORAL | Status: DC
  Administered 2022-02-06 – 2022-02-10 (×5): 10 mg via ORAL
  Filled 2022-02-06 (×5): qty 1

## 2022-02-06 MED ORDER — ESCITALOPRAM OXALATE 20 MG PO TABS
20.0000 mg | ORAL_TABLET | Freq: Every day | ORAL | Status: DC
  Administered 2022-02-06 – 2022-02-10 (×5): 20 mg via ORAL
  Filled 2022-02-06 (×5): qty 1

## 2022-02-06 MED ORDER — MORPHINE SULFATE (PF) 2 MG/ML IV SOLN: 1.0000 mg | INTRAVENOUS | Status: DC | PRN

## 2022-02-06 MED ORDER — PANTOPRAZOLE SODIUM 40 MG PO TBEC
40.0000 mg | DELAYED_RELEASE_TABLET | Freq: Two times a day (BID) | ORAL | Status: DC
  Administered 2022-02-06 – 2022-02-10 (×9): 40 mg via ORAL
  Filled 2022-02-06 (×9): qty 1

## 2022-02-06 NOTE — Hospital Course (Addendum)
Tracy Perez is a 56 y.o. female with a history of breast cancer in remission, hypertension, hyperlipidemia, depression, anxiety. Patient presented secondary to left flank pain and was found to have a left perinephric hematoma with associated exophytic lesion. Urology consulted and have recommended pain control and monitoring hemoglobin at this time with eventual follow-up with urology as an outpatient for repeat imaging. Patient has now developed fever, on top of previously elevated WBC, concerning for sepsis picture. Workup initiated. ?

## 2022-02-06 NOTE — Assessment & Plan Note (Addendum)
- 

## 2022-02-06 NOTE — Assessment & Plan Note (Addendum)
Patient is on losartan, amlodipine and chlorthalidone. Blood pressure controlled. Continue home regimen. ?

## 2022-02-06 NOTE — Progress Notes (Signed)
? ?PROGRESS NOTE ? ? ? ?Tracy Perez  VFI:[id number] DOB: 08-Feb-1966 DOA: 02/05/2022 ?PCP: Berkley Harvey, NP ? ? ?Brief Narrative: ?Tracy Perez is a 56 y.o. female with a history of breast cancer in remission, hypertension, hyperlipidemia, depression, anxiety. Patient presented secondary to left flank pain and was found to have a left perinephric hematoma with associated exophytic lesion. Urology consulted and have recommended pain control and monitoring hemoglobin at this time with eventual follow-up with urology as an outpatient for repeat imaging. ? ? ?Assessment and Plan: ?* Perinephric hematoma ?Left sided. Noted on CT abdomen/pelvis. Normal hemoglobin. Urology consulted on admission and recommend observation in addition to outpatient follow-up in 6 weeks for repeat imaging. ?-Continue morphine IV ?-Start Norco PRN ?-CBC in AM ? ?Leukocytosis ?Possibly reactive. No infectious symptomology. No evidence of pyelonephritis on imaging. No urinary symptoms. Associated elevated temperature of 100.1 ?F this morning. Leukocytosis improving. ?-CBC in AM ? ?Hyperlipidemia ?-Continue home pravastatin ? ?Essential hypertension ?Patient is on losartan, amlodipine and chlorthalidone. Blood pressure controlled. ?-Continue home losartan and hydralazine ? ?Depressive disorder ?-Continue home Lexapro ? ? ? ?DVT prophylaxis: SCDs ?Code Status:   Code Status: Full Code ?Family Communication: Daughter at bedside ?Disposition Plan: Discharge home tomorrow if pain is improved and hemoglobin is stable ? ? ?Consultants:  ?Urology ? ?Procedures:  ?None ? ?Antimicrobials: ?None  ? ? ?Subjective: ?Pain is controlled with IV morphine. No dysuria. ? ?Objective: ?BP 121/87 (BP Location: Right Arm)   Pulse (!) 102   Temp 100.1 ?F (37.8 ?C) (Oral)   Resp 18   Ht '5\' 7"'$  (1.702 m)   Wt 94.8 kg   LMP 02/10/2013   SpO2 100%   BMI 32.73 kg/m?  ? ?Examination: ? ?General exam: Appears calm and comfortable. Resting in bed.   ? ? ?Data Reviewed: I have personally reviewed following labs and imaging studies ? ?CBC ?Lab Results  ?Component Value Date  ? WBC 14.3 (H) 02/06/2022  ? RBC 4.33 02/06/2022  ? HGB 12.6 02/06/2022  ? HCT 39.3 02/06/2022  ? MCV 90.8 02/06/2022  ? MCH 29.1 02/06/2022  ? PLT 276 02/06/2022  ? MCHC 32.1 02/06/2022  ? RDW 12.4 02/06/2022  ? LYMPHSABS 1.8 03/18/2015  ? MONOABS 0.6 03/18/2015  ? EOSABS 0.1 03/18/2015  ? BASOSABS 0.0 03/18/2015  ? ? ? ?Last metabolic panel ?Lab Results  ?Component Value Date  ? NA 136 02/06/2022  ? K 3.2 (L) 02/06/2022  ? CL 105 02/06/2022  ? CO2 23 02/06/2022  ? BUN 13 02/06/2022  ? CREATININE 0.65 02/06/2022  ? GLUCOSE 108 (H) 02/06/2022  ? GFRNONAA >60 02/06/2022  ? GFRAA >60 02/13/2016  ? CALCIUM 8.7 (L) 02/06/2022  ? PROT 6.7 02/06/2022  ? ALBUMIN 3.9 02/06/2022  ? BILITOT 0.8 02/06/2022  ? ALKPHOS 51 02/06/2022  ? AST 22 02/06/2022  ? ALT 17 02/06/2022  ? ANIONGAP 8 02/06/2022  ? ? ?GFR: ?Estimated Creatinine Clearance: 91.7 mL/min (by C-G formula based on SCr of 0.65 mg/dL). ? ?No results found for this or any previous visit (from the past 240 hour(s)).  ? ? ?Radiology Studies: ?CT Renal Stone Study ? ?Result Date: 02/05/2022 ?CLINICAL DATA:  Left abdominal pain, flank pain EXAM: CT ABDOMEN AND PELVIS WITHOUT CONTRAST TECHNIQUE: Multidetector CT imaging of the abdomen and pelvis was performed following the standard protocol without IV contrast. RADIATION DOSE REDUCTION: This exam was performed according to the departmental dose-optimization program which includes automated exposure control, adjustment of the mA and/or  kV according to patient size and/or use of iterative reconstruction technique. COMPARISON:  None. FINDINGS: Lower chest: Linear atelectasis in the lung bases.  No effusions. Hepatobiliary: No focal hepatic abnormality. Gallbladder unremarkable. Pancreas: No focal abnormality or ductal dilatation. Spleen: No focal abnormality.  Normal size. Adrenals/Urinary Tract:  Adrenal glands and right kidney unremarkable. There is a subcapsular left perinephric hematoma measuring up to 1.9 cm in thickness. Exophytic left upper pole renal mass noted which is possibly the source of the perinephric hematoma. This measures 3.2 cm and cannot be characterized on this noncontrast study. No hydronephrosis. Urinary bladder unremarkable. Stomach/Bowel: Stomach, large and small bowel grossly unremarkable. Vascular/Lymphatic: No evidence of aneurysm or adenopathy. Reproductive: Enlarged fibroid uterus. Large exophytic fibroid off the left fundus measuring up to 7.6 cm. Other: No free fluid or free air. Musculoskeletal: No acute bony abnormality. IMPRESSION: Subcapsular left perinephric hematoma measuring up to 1.9 cm in thickness. There is a left upper pole exophytic mass measuring 3.2 cm which is difficult to characterize on this noncontrast study. This could be the source of the hematoma. No hydronephrosis. Fibroid uterus. Electronically Signed   By: Rolm Baptise M.D.   On: 02/05/2022 23:11   ? ? ? LOS: 0 days  ? ? ?Cordelia Poche, MD ?[facility] ?02/06/2022, 10:47 AM ? ? ?If 7PM-7AM, please contact night-coverage ?[email or web address] ? ?

## 2022-02-06 NOTE — Consult Note (Signed)
Urology Consult  ? ?Physician requesting consult: Shela Leff, MD ? ?Reason for consult: Left renal mass, Subcapsular perinephric hematoma ? ?History of Present Illness: Tracy Perez is a 56 y.o. with a past medical history of hypertension, hyperlipidemia, PVD, asthma, GERD who is seen in consultation for left renal mass some subcapsular left perinephric hematoma. ? ?She presented to the ED today with complaints of left-sided flank pain and vomiting that began earlier today.  She states that her pain has improved.  She denies gross hematuria.  She denies dizziness or lightheadedness.  She denies any trauma.  She denies a prior history of known urologic malignancy.  She denies a family history of urologic malignancy. ? ?CT A/P 02/05/2022 demonstrated left upper pole renal mass measuring 3.2 cm on a noncontrasted study along with a subcapsular left perinephric hematoma measuring up to 2 cm. ? ?Hemoglobin is appropriate at 13.0. ? ?She denies a history of voiding or storage urinary symptoms, hematuria, UTIs, STDs, urolithiasis, GU malignancy/trauma/surgery. ? ?Past Medical History:  ?Diagnosis Date  ? Allergy   ? Anemia   ? Anxiety   ? Asthma   ? Breast cancer ([place]) 01/16/2013  ?  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 Colon polyps   ? Depression   ? Fibroids   ? GERD (gastroesophageal reflux disease)   ? H pylori ulcer 07/21/2021  ? H/O Doppler ultrasound 02/17/2016  ? left lower leg and right upper leg  ? HLD (hyperlipidemia)   ? Hypertension   ? Peripheral vascular disease ([place])   ? S/P radiation therapy 03/06/2013-04/20/2013  ? Left Breast  /50.4Gy in 28 fractions/ Left Breast Boost / 10 Gy in 5 fractions  ? Use of tamoxifen (Nolvadex)   ? ? ?Past Surgical History:  ?Procedure Laterality Date  ? APPENDECTOMY    ? CERVICAL CONE BIOPSY  1987  ? CESAREAN SECTION    ?  x 2  ? COLONOSCOPY    ? 06-07 was normal in [place]  ?  DILATATION & CURETTAGE/HYSTEROSCOPY WITH MYOSURE N/A 02/18/2016  ? Procedure: [place];  Surgeon: Servando Salina, MD;  Location: [facility];  Service: Gynecology;  Laterality: N/A;  ? LEG SURGERY Left 1994  ? vascular  ? PARTIAL MASTECTOMY WITH NEEDLE LOCALIZATION Left 01/16/2013  ? Procedure: PARTIAL MASTECTOMY WITH NEEDLE LOCALIZATION;  Surgeon: Adin Hector, MD;  Location: [facility];  Service: General;  Laterality: Left;  Left partial mastectomy with needle localization at breast center of [place]   ? UPPER GASTROINTESTINAL ENDOSCOPY    ? ? ? ?Current Hospital Medications: ? ?Home meds:  ?No current facility-administered medications on file prior to encounter.  ? ?Current Outpatient Medications on File Prior to Encounter  ?Medication Sig Dispense Refill  ? albuterol (VENTOLIN HFA) 108 (90 Base) MCG/ACT inhaler Inhale 2 puffs into the lungs every 6 (six) hours as needed for wheezing or shortness of breath.    ? amLODipine (NORVASC) 10 MG tablet Take 1 tablet by mouth daily.    ? chlorthalidone (HYGROTON) 25 MG tablet Take 12.5 mg by mouth daily. Reported on 04/28/2016    ? cholecalciferol (VITAMIN D) 1000 UNITS tablet Take 1,000 Units by mouth daily. Reported on 04/28/2016    ? escitalopram (LEXAPRO) 20 MG tablet Take 20 mg by mouth daily.    ? ibuprofen (ADVIL) 600 MG tablet Take 1 tablet (600 mg total) by mouth every 6 (  six) hours as needed. 30 tablet 0  ? Lidocaine (HM LIDOCAINE PATCH) 4 % PTCH Apply 1 patch topically daily as needed. (Patient not taking: Reported on 11/03/2021) 1 patch 0  ? losartan (COZAAR) 100 MG tablet Take 100 mg by mouth daily.    ? losartan (COZAAR) 50 MG tablet Take 50 mg by mouth daily.  (Patient not taking: Reported on 09/26/2021)    ? methocarbamol (ROBAXIN) 500 MG tablet Take 1 tablet (500 mg total) by mouth 2 (two) times daily. (Patient not taking: Reported on 11/03/2021) 20 tablet 0  ? pantoprazole (PROTONIX) 40 MG tablet Take 1  tablet (40 mg total) by mouth 2 (two) times daily before a meal. Pantoprazole 40 mg twice daily ( before first and last meal of the day ) for 4 weeks, then once daily for 8 weeks. 60 tablet 2  ? potassium chloride (KLOR-CON) 10 MEQ tablet Take 10 mEq by mouth daily.    ? Prasterone (INTRAROSA) 6.5 MG INST  (Patient not taking: Reported on 11/03/2021)    ? pravastatin (PRAVACHOL) 10 MG tablet Take 1 tablet by mouth daily.    ?  ? ?Scheduled Meds: ?Continuous Infusions: ?PRN Meds:. ? ?Allergies:  ?Allergies  ?Allergen Reactions  ? Iodinated Contrast Media Nausea And Vomiting  ?  Other reaction(s): NAUSEA, VOMITING ?Other reaction(s): NAUSEA, VOMITING ?  ? Prednisolone Other (See Comments)  ?  Other reaction(s): HEADACHE ?Other reaction(s): HEADACHE ?  ? Gadobutrol Nausea And Vomiting  ?  Nausea and vomiting immediately following injection.  ?MRI Dye  ?Nausea and vomiting immediately following injection.  ?MRI Dye  ?Nausea and vomiting immediately following injection.  ?MRI Dye  ?Nausea and vomiting immediately following injection.  ?MRI Dye  ?  ? Gadolinium Derivatives Nausea And Vomiting  ?  Nausea and vomiting immediately following injection.  ?MRI Dye   ? Gadoversetamide Nausea And Vomiting  ?  Nausea and vomiting immediately following injection.  ?MRI Dye   ? ? ?Family History  ?Problem Relation Age of Onset  ? Asthma Mother   ? Allergies Mother   ? Hypertension Mother   ? Anuerysm Mother   ?     brain  ? Hyperlipidemia Mother   ? Cancer Father   ?     type unknown  ? Colon polyps Brother   ? Colon polyps Maternal Aunt   ? Breast cancer Maternal Aunt   ? Arthritis Maternal Aunt   ? Bipolar disorder Maternal Aunt   ? Depression Maternal Aunt   ? Arthritis Maternal Aunt   ? Allergies Daughter   ? Hypertension Daughter   ? Hypertension Son   ? Colon cancer Neg Hx   ? Rectal cancer Neg Hx   ? Stomach cancer Neg Hx   ? Esophageal cancer Neg Hx   ? ? ?Social History:  reports that she quit smoking about 32 years ago.  Her smoking use included cigarettes. She has a 4.50 pack-year smoking history. She has never used smokeless tobacco. She reports current alcohol use. She reports that she does not use drugs. ? ?ROS: ?A complete review of systems was performed.  All systems are negative except for pertinent findings as noted. ? ?Physical Exam:  ?Vital signs in last 24 hours: ?Temp:  [98.2 ?F (36.8 ?C)-99.4 ?F (37.4 ?C)] 99.4 ?F (37.4 ?C) (03/17 2836) ?Pulse Rate:  [71-102] 86 (03/17 0312) ?Resp:  [18-20] 19 (03/17 6294) ?BP: (111-133)/(59-99) 122/79 (03/17 7654) ?SpO2:  [94 %-100 %] 100 % (03/17 0312) ?Weight:  [  94.8 kg] 94.8 kg (03/16 1824) ?Constitutional:  Alert and oriented, No acute distress ?Cardiovascular: Regular rate and rhythm ?Respiratory: Normal respiratory effort, Lungs clear bilaterally ?GI: Abdomen is soft, nontender, nondistended, no abdominal masses ?GU: No CVA tenderness ?Neurologic: Grossly intact, no focal deficits ?Psychiatric: Normal mood and affect ? ?Laboratory Data:  ?Recent Labs  ?  02/05/22 ?[place] 02/06/22 ?0442  ?WBC 18.0* 14.4*  ?HGB 13.5 13.0  ?HCT 41.5 40.1  ?PLT 295 278  ? ? ?Recent Labs  ?  02/05/22 ?1954 02/06/22 ?0442  ?NA 139 136  ?K 3.5 3.2*  ?CL 101 105  ?GLUCOSE 159* 108*  ?BUN 13 13  ?CALCIUM 9.4 8.7*  ?CREATININE 0.92 0.65  ? ? ? ?Results for orders placed or performed during the hospital encounter of 02/05/22 (from the past 24 hour(s))  ?Lipase, blood     Status: None  ? Collection Time: 02/05/22  7:54 PM  ?Result Value Ref Range  ? Lipase 33 11 - 51 U/L  ?Comprehensive metabolic panel     Status: Abnormal  ? Collection Time: 02/05/22  7:54 PM  ?Result Value Ref Range  ? Sodium 139 135 - 145 mmol/L  ? Potassium 3.5 3.5 - 5.1 mmol/L  ? Chloride 101 98 - 111 mmol/L  ? CO2 26 22 - 32 mmol/L  ? Glucose, Bld 159 (H) 70 - 99 mg/dL  ? BUN 13 6 - 20 mg/dL  ? Creatinine, Ser 0.92 0.44 - 1.00 mg/dL  ? Calcium 9.4 8.9 - 10.3 mg/dL  ? Total Protein 8.0 6.5 - 8.1 g/dL  ? Albumin 4.7 3.5 - 5.0 g/dL  ? AST  28 15 - 41 U/L  ? ALT 21 0 - 44 U/L  ? Alkaline Phosphatase 68 38 - 126 U/L  ? Total Bilirubin 0.8 0.3 - 1.2 mg/dL  ? GFR, Estimated >60 >60 mL/min  ? Anion gap 12 5 - 15  ?CBC     Status: Abnormal  ? Col

## 2022-02-06 NOTE — Assessment & Plan Note (Addendum)
Left sided. Noted on CT abdomen/pelvis. Normal hemoglobin. Urology consulted on admission and recommend observation in addition to outpatient follow-up in 6 weeks for repeat imaging. Hemoglobin is stable. Follow-up with urology. ?

## 2022-02-06 NOTE — Progress Notes (Signed)
Plan of Care Note for accepted transfer ? ? ?Patient: Tracy Perez MRN: [id number]   DOA: 02/05/2022 ? ?Facility requesting transfer: [facility] ED ?Requesting Provider: Margaretmary Lombard PA ?Reason for transfer: Renal mass and hematoma ?Facility course:  ?Patient is a 56 year old female with a past medical history of hypertension, hyperlipidemia, PVD, asthma, GERD presented with left-sided flank pain and vomiting.  WBC 18.  No fever, vital signs stable.  Hemoglobin 13.5.  Urine microscopy with 11-20 RBCs.  CT showing left renal upper pole exophytic mass and subcapsular perinephric hematoma.  ED PA discussed the case with on-call urologist who felt this could be a ruptured cyst versus underlying malignancy.  Recommended admission for observation, monitoring H&H.  Urology will consult in the morning.  Patient was given Toradol, Zofran, and 500 cc fluid bolus. ? ?Plan of care: ?The patient is accepted for admission to [place]  unit, at [facility]. ? ?Author: ?Shela Leff, MD ?02/06/2022 ? ?Check [email or web address] for on-call coverage. ? ?Nursing staff, Please call [place] number on Amion as soon as patient's arrival, so appropriate admitting provider can evaluate the pt. ?

## 2022-02-06 NOTE — H&P (Addendum)
?History and Physical  ? ? ?Tracy Perez EHM:[id number] DOB: Mar 25, 1966 DOA: 02/05/2022 ? ?PCP: Berkley Harvey, NP  ?Patient coming from: Home. ? ?Chief Complaint: Left flank pain. ? ?HPI: Tracy Perez is a 56 y.o. female with history of hypertension breast cancer in remission presents to the ER with complaints of left flank pain since yesterday around 2 PM.  Pain was radiating across the upper abdomen.  Denies any nausea vomiting fever chills or diarrhea. ? ?ED Course: In the ER CT scan of the abdomen shows capsular left perinephric hematoma measuring 1.9 cm with an exophytic lesion.  Dr. Abner Greenspan urologist was consulted.  Requested admission for serial CBC and further work-up.  COVID test is pending.  Hemoglobin is around 13. ? ?Review of Systems: As per HPI, rest all negative. ? ? ?Past Medical History:  ?Diagnosis Date  ? Allergy   ? Anemia   ? Anxiety   ? Asthma   ? Breast cancer ([place]) 01/16/2013  ?  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 Colon polyps   ? Depression   ? Fibroids   ? GERD (gastroesophageal reflux disease)   ? H pylori ulcer 07/21/2021  ? H/O Doppler ultrasound 02/17/2016  ? left lower leg and right upper leg  ? HLD (hyperlipidemia)   ? Hypertension   ? Peripheral vascular disease ([place])   ? S/P radiation therapy 03/06/2013-04/20/2013  ? Left Breast  /50.4Gy in 28 fractions/ Left Breast Boost / 10 Gy in 5 fractions  ? Use of tamoxifen (Nolvadex)   ? ? ?Past Surgical History:  ?Procedure Laterality Date  ? APPENDECTOMY    ? CERVICAL CONE BIOPSY  1987  ? CESAREAN SECTION    ?  x 2  ? COLONOSCOPY    ? 06-07 was normal in [place]  ? DILATATION & CURETTAGE/HYSTEROSCOPY WITH MYOSURE N/A 02/18/2016  ? Procedure: [place];  Surgeon: Servando Salina, MD;  Location: [facility];  Service: Gynecology;  Laterality: N/A;  ? LEG SURGERY Left 1994  ? vascular  ? PARTIAL MASTECTOMY  WITH NEEDLE LOCALIZATION Left 01/16/2013  ? Procedure: PARTIAL MASTECTOMY WITH NEEDLE LOCALIZATION;  Surgeon: Adin Hector, MD;  Location: [facility];  Service: General;  Laterality: Left;  Left partial mastectomy with needle localization at breast center of [place]   ? UPPER GASTROINTESTINAL ENDOSCOPY    ? ? ? reports that she quit smoking about 32 years ago. Her smoking use included cigarettes. She has a 4.50 pack-year smoking history. She has never used smokeless tobacco. She reports current alcohol use. She reports that she does not use drugs. ? ?Allergies  ?Allergen Reactions  ? Iodinated Contrast Media Nausea And Vomiting  ?  Other reaction(s): NAUSEA, VOMITING ?Other reaction(s): NAUSEA, VOMITING ?  ? Prednisolone Other (See Comments)  ?  Other reaction(s): HEADACHE ?Other reaction(s): HEADACHE ?  ? Gadobutrol Nausea And Vomiting  ?  Nausea and vomiting immediately following injection.  ?MRI Dye  ?Nausea and vomiting immediately following injection.  ?MRI Dye  ?Nausea and vomiting immediately following injection.  ?MRI Dye  ?Nausea and vomiting immediately following injection.  ?MRI Dye  ?  ? Gadolinium Derivatives Nausea And Vomiting  ?  Nausea and vomiting immediately following injection.  ?MRI Dye   ? Gadoversetamide Nausea And Vomiting  ?  Nausea and vomiting immediately following injection.  ?MRI Dye   ? ? ?Family History  ?  Problem Relation Age of Onset  ? Asthma Mother   ? Allergies Mother   ? Hypertension Mother   ? Anuerysm Mother   ?     brain  ? Hyperlipidemia Mother   ? Cancer Father   ?     type unknown  ? Colon polyps Brother   ? Colon polyps Maternal Aunt   ? Breast cancer Maternal Aunt   ? Arthritis Maternal Aunt   ? Bipolar disorder Maternal Aunt   ? Depression Maternal Aunt   ? Arthritis Maternal Aunt   ? Allergies Daughter   ? Hypertension Daughter   ? Hypertension Son   ? Colon cancer Neg Hx   ? Rectal cancer Neg Hx   ? Stomach cancer Neg Hx   ? Esophageal cancer Neg Hx    ? ? ?Prior to Admission medications   ?Medication Sig Start Date End Date Taking? Authorizing Provider  ?albuterol (VENTOLIN HFA) 108 (90 Base) MCG/ACT inhaler Inhale 2 puffs into the lungs every 6 (six) hours as needed for wheezing or shortness of breath.    [provider]  ?amLODipine (NORVASC) 10 MG tablet Take 1 tablet by mouth daily. 05/28/20   [provider]  ?chlorthalidone (HYGROTON) 25 MG tablet Take 12.5 mg by mouth daily. Reported on 04/28/2016    [provider]  ?cholecalciferol (VITAMIN D) 1000 UNITS tablet Take 1,000 Units by mouth daily. Reported on 04/28/2016    [provider]  ?escitalopram (LEXAPRO) 20 MG tablet Take 20 mg by mouth daily. 04/07/21   [provider]  ?ibuprofen (ADVIL) 600 MG tablet Take 1 tablet (600 mg total) by mouth every 6 (six) hours as needed. 10/24/21   Sherrell Puller, PA-C  ?Lidocaine (HM LIDOCAINE PATCH) 4 % PTCH Apply 1 patch topically daily as needed. ?Patient not taking: Reported on 11/03/2021 10/24/21   Sherrell Puller, PA-C  ?losartan (COZAAR) 100 MG tablet Take 100 mg by mouth daily. 09/24/21   [provider]  ?losartan (COZAAR) 50 MG tablet Take 50 mg by mouth daily.  ?Patient not taking: Reported on 09/26/2021 12/24/13   [provider]  ?methocarbamol (ROBAXIN) 500 MG tablet Take 1 tablet (500 mg total) by mouth 2 (two) times daily. ?Patient not taking: Reported on 11/03/2021 10/24/21   Sherrell Puller, PA-C  ?pantoprazole (PROTONIX) 40 MG tablet Take 1 tablet (40 mg total) by mouth 2 (two) times daily before a meal. Pantoprazole 40 mg twice daily ( before first and last meal of the day ) for 4 weeks, then once daily for 8 weeks. 07/21/21   Pyrtle, Lajuan Lines, MD  ?potassium chloride (KLOR-CON) 10 MEQ tablet Take 10 mEq by mouth daily. 04/04/21   [provider]  ?Prasterone [place]) 6.5 MG INST  01/24/20   [provider]  ?pravastatin (PRAVACHOL) 10 MG tablet Take 1 tablet by mouth daily. 06/10/21    [provider]  ? ? ?Physical Exam: ?Constitutional: Moderately built and nourished. ?Vitals:  ? 02/05/22 2145 02/05/22 2340 02/06/22 0115 02/06/22 0312  ?BP: 129/77 133/86 122/83 122/79  ?Pulse: 78 (!) 102 97 86  ?Resp: '19 18 18 19  '$ ?Temp:    99.4 ?F (37.4 ?C)  ?TempSrc:    Oral  ?SpO2: 99% 97% 94% 100%  ?Weight:      ?Height:      ? ?Eyes: Anicteric no pallor. ?ENMT: No discharge from the ears eyes nose and mouth. ?Neck: No mass felt.  No neck rigidity. ?Respiratory: No rhonchi or crepitations. ?  Cardiovascular: S1-S2 heard. ?Abdomen: Mild tenderness left flank area.  No rebound tenderness. ?Musculoskeletal: No edema. ?Skin: No rash. ?Neurologic: Alert awake oriented time place and person.  Moves all extremities. ?Psychiatric: Appears normal.  Normal affect. ? ? ?Labs on Admission: I have personally reviewed following labs and imaging studies ? ?CBC: ?Recent Labs  ?Lab 02/05/22 ?1954 02/06/22 ?0442  ?WBC 18.0* 14.4*  ?HGB 13.5 13.0  ?HCT 41.5 40.1  ?MCV 89.8 91.1  ?PLT 295 278  ? ?Basic Metabolic Panel: ?Recent Labs  ?Lab 02/05/22 ?1954 02/06/22 ?0442  ?NA 139 136  ?K 3.5 3.2*  ?CL 101 105  ?CO2 26 23  ?GLUCOSE 159* 108*  ?BUN 13 13  ?CREATININE 0.92 0.65  ?CALCIUM 9.4 8.7*  ? ?GFR: ?Estimated Creatinine Clearance: 91.7 mL/min (by C-G formula based on SCr of 0.65 mg/dL). ?Liver Function Tests: ?Recent Labs  ?Lab 02/05/22 ?1954 02/06/22 ?0442  ?AST 28 22  ?ALT 21 17  ?ALKPHOS 68 51  ?BILITOT 0.8 0.8  ?PROT 8.0 6.7  ?ALBUMIN 4.7 3.9  ? ?Recent Labs  ?Lab 02/05/22 ?1954  ?LIPASE 33  ? ?No results for input(s): AMMONIA in the last 168 hours. ?Coagulation Profile: ?No results for input(s): INR, PROTIME in the last 168 hours. ?Cardiac Enzymes: ?No results for input(s): CKTOTAL, CKMB, CKMBINDEX, TROPONINI in the last 168 hours. ?BNP (last 3 results) ?No results for input(s): PROBNP in the last 8760 hours. ?HbA1C: ?No results for input(s): HGBA1C in the last 72 hours. ?CBG: ?No results for input(s): GLUCAP in the  last 168 hours. ?Lipid Profile: ?No results for input(s): CHOL, HDL, LDLCALC, TRIG, CHOLHDL, LDLDIRECT in the last 72 hours. ?Thyroid Function Tests: ?No results for input(s): TSH, T4TOTAL, FREET4, T3FREE, TH

## 2022-02-06 NOTE — Assessment & Plan Note (Addendum)
Continue home Lexapro. 

## 2022-02-06 NOTE — Assessment & Plan Note (Addendum)
See problem, Sepsis ?

## 2022-02-06 NOTE — Plan of Care (Signed)
?  Problem: Health Behavior/Discharge Planning: Goal: Ability to manage health-related needs will improve Outcome: Progressing   Problem: Clinical Measurements: Goal: Diagnostic test results will improve Outcome: Progressing   Problem: Coping: Goal: Level of anxiety will decrease Outcome: Progressing   Problem: Pain Managment: Goal: General experience of comfort will improve Outcome: Progressing   

## 2022-02-07 DIAGNOSIS — Z6832 Body mass index (BMI) 32.0-32.9, adult: Secondary | ICD-10-CM | POA: Diagnosis not present

## 2022-02-07 DIAGNOSIS — Z91041 Radiographic dye allergy status: Secondary | ICD-10-CM | POA: Diagnosis not present

## 2022-02-07 DIAGNOSIS — F32A Depression, unspecified: Secondary | ICD-10-CM

## 2022-02-07 DIAGNOSIS — Z20822 Contact with and (suspected) exposure to covid-19: Secondary | ICD-10-CM | POA: Diagnosis present

## 2022-02-07 DIAGNOSIS — A419 Sepsis, unspecified organism: Secondary | ICD-10-CM

## 2022-02-07 DIAGNOSIS — E785 Hyperlipidemia, unspecified: Secondary | ICD-10-CM | POA: Diagnosis present

## 2022-02-07 DIAGNOSIS — Z83438 Family history of other disorder of lipoprotein metabolism and other lipidemia: Secondary | ICD-10-CM | POA: Diagnosis not present

## 2022-02-07 DIAGNOSIS — Z888 Allergy status to other drugs, medicaments and biological substances status: Secondary | ICD-10-CM | POA: Diagnosis not present

## 2022-02-07 DIAGNOSIS — I10 Essential (primary) hypertension: Secondary | ICD-10-CM | POA: Diagnosis not present

## 2022-02-07 DIAGNOSIS — N2889 Other specified disorders of kidney and ureter: Secondary | ICD-10-CM | POA: Diagnosis present

## 2022-02-07 DIAGNOSIS — E669 Obesity, unspecified: Secondary | ICD-10-CM | POA: Diagnosis present

## 2022-02-07 DIAGNOSIS — S37019A Minor contusion of unspecified kidney, initial encounter: Secondary | ICD-10-CM | POA: Diagnosis not present

## 2022-02-07 DIAGNOSIS — Z79899 Other long term (current) drug therapy: Secondary | ICD-10-CM | POA: Diagnosis not present

## 2022-02-07 DIAGNOSIS — J45909 Unspecified asthma, uncomplicated: Secondary | ICD-10-CM | POA: Diagnosis present

## 2022-02-07 DIAGNOSIS — Z803 Family history of malignant neoplasm of breast: Secondary | ICD-10-CM | POA: Diagnosis not present

## 2022-02-07 DIAGNOSIS — Z87891 Personal history of nicotine dependence: Secondary | ICD-10-CM | POA: Diagnosis not present

## 2022-02-07 DIAGNOSIS — Z853 Personal history of malignant neoplasm of breast: Secondary | ICD-10-CM | POA: Diagnosis not present

## 2022-02-07 DIAGNOSIS — Z9012 Acquired absence of left breast and nipple: Secondary | ICD-10-CM | POA: Diagnosis not present

## 2022-02-07 DIAGNOSIS — Z8371 Family history of colonic polyps: Secondary | ICD-10-CM | POA: Diagnosis not present

## 2022-02-07 DIAGNOSIS — Z8249 Family history of ischemic heart disease and other diseases of the circulatory system: Secondary | ICD-10-CM | POA: Diagnosis not present

## 2022-02-07 DIAGNOSIS — T148XXA Other injury of unspecified body region, initial encounter: Secondary | ICD-10-CM | POA: Diagnosis present

## 2022-02-07 DIAGNOSIS — Z825 Family history of asthma and other chronic lower respiratory diseases: Secondary | ICD-10-CM | POA: Diagnosis not present

## 2022-02-07 DIAGNOSIS — Z923 Personal history of irradiation: Secondary | ICD-10-CM | POA: Diagnosis not present

## 2022-02-07 DIAGNOSIS — N390 Urinary tract infection, site not specified: Secondary | ICD-10-CM | POA: Diagnosis present

## 2022-02-07 LAB — RESPIRATORY PANEL BY PCR

## 2022-02-07 LAB — CBC
HCT: 38.6 % (ref 36.0–46.0)
Hemoglobin: 12.3 g/dL (ref 12.0–15.0)
MCH: 29.4 pg (ref 26.0–34.0)
MCHC: 31.9 g/dL (ref 30.0–36.0)
MCV: 92.1 fL (ref 80.0–100.0)
Platelets: 264 10*3/uL (ref 150–400)
RBC: 4.19 MIL/uL (ref 3.87–5.11)
RDW: 12.5 % (ref 11.5–15.5)
WBC: 15.3 10*3/uL — ABNORMAL HIGH (ref 4.0–10.5)
nRBC: 0 % (ref 0.0–0.2)

## 2022-02-07 LAB — HIV ANTIBODY (ROUTINE TESTING W REFLEX): HIV Screen 4th Generation wRfx: NONREACTIVE

## 2022-02-07 LAB — PROCALCITONIN: Procalcitonin: <0.1 ng/mL

## 2022-02-07 LAB — POTASSIUM: Potassium: 3.8 mmol/L (ref 3.5–5.1)

## 2022-02-07 MED ORDER — SODIUM CHLORIDE 0.9 % IV SOLN
2.0000 g | INTRAVENOUS | Status: DC
  Administered 2022-02-07 – 2022-02-10 (×4): 2 g via INTRAVENOUS
  Filled 2022-02-07 (×4): qty 20

## 2022-02-07 MED ORDER — DOCUSATE SODIUM 100 MG PO CAPS
100.0000 mg | ORAL_CAPSULE | Freq: Once | ORAL | Status: AC
  Administered 2022-02-07: 100 mg via ORAL
  Filled 2022-02-07: qty 1

## 2022-02-07 NOTE — Assessment & Plan Note (Addendum)
Not present on admission. Unsure of source, but possibly urinary. No symptoms otherwise. Associated fever and leukocytosis. Tmax of 101.1 ?F in the last 24 hours. Procalcitonin mildly elevated now back undetectable. RVP negative. Urine culture with insignificant growth. Patient has allergies to CT/MRI contrast in addition to steroids and declined administration. Blood and urine cultures without source. ID consulted with recommendations for 14 days of total antibiotics. Patient transitioned from Ceftriaxone to Cefdinir on discharge with recommendation for infectious disease follow-up. Leukocytosis trended down prior to discharge. ?

## 2022-02-07 NOTE — Progress Notes (Signed)
? ?PROGRESS NOTE ? ? ? ?Tracy Perez  EXB:[id number] DOB: [birth year] DOA: 02/05/2022 ?PCP: Berkley Harvey, NP ? ? ?Brief Narrative: ?Tracy Perez is a 56 y.o. female with a history of breast cancer in remission, hypertension, hyperlipidemia, depression, anxiety. Patient presented secondary to left flank pain and was found to have a left perinephric hematoma with associated exophytic lesion. Urology consulted and have recommended pain control and monitoring hemoglobin at this time with eventual follow-up with urology as an outpatient for repeat imaging. Patient has now developed fever, on top of previously elevated WBC, concerning for sepsis picture. Workup initiated. ? ? ?Assessment and Plan: ?* Perinephric hematoma ?Left sided. Noted on CT abdomen/pelvis. Normal hemoglobin. Urology consulted on admission and recommend observation in addition to outpatient follow-up in 6 weeks for repeat imaging. Hemoglobin is stable. ?-Continue morphine IV ?-Norco PRN ?-CBC in AM ? ?Sepsis ([place]) ?Not present on admission. Unsure of source, but possibly urinary. No symptoms otherwise. Associated fever and leukocytosis. Tmax of 102.9 ?F in the last 24 hours. ?-Urine and blood cultures ?-Ceftriaxone for possible urinary source ?-RVP to rule out viral respiratory infection ?-Procalcitonin ? ?Leukocytosis ?See problem, Sepsis ? ?Hyperlipidemia ?-Continue home pravastatin ? ?Essential hypertension ?Patient is on losartan, amlodipine and chlorthalidone. Blood pressure controlled. ?-Continue home losartan and hydralazine ? ?Depressive disorder ?-Continue home Lexapro ? ? ? ?DVT prophylaxis: SCDs ?Code Status:   Code Status: Full Code ?Family Communication: Son at bedside ?Disposition Plan: Discharge home pending sepsis workup. Possibly in 2-3 days ? ? ?Consultants:  ?Urology ? ?Procedures:  ?None ? ?Antimicrobials: ?None  ? ? ?Subjective: ?Fevers overnight. Some continued left flank pain. No other concerns. No dyspnea,  rhinorrhea, sneezing, cough, dysuria. ? ?Objective: ?BP 111/72 (BP Location: Right Arm)   Pulse 90   Temp 98.6 ?F (37 ?C) (Oral)   Resp 20   Ht '5\' 7"'$  (1.702 m)   Wt 94.8 kg   LMP 02/10/2013   SpO2 93%   BMI 32.73 kg/m?  ? ?Examination: ? ?General exam: Appears calm and comfortable ?Respiratory system: Clear to auscultation. Respiratory effort normal. ?Cardiovascular system: S1 & S2 heard, RRR. No murmurs, rubs, gallops or clicks. ?Gastrointestinal system: Abdomen is nondistended, soft and nontender. No organomegaly or masses felt. Normal bowel sounds heard. ?Central nervous system: Alert and oriented. No focal neurological deficits. ?Musculoskeletal: No edema. No calf tenderness ?Skin: No cyanosis. No rashes ?Psychiatry: Judgement and insight appear normal. Mood & affect appropriate.  ? ? ?Data Reviewed: I have personally reviewed following labs and imaging studies ? ?CBC ?Lab Results  ?Component Value Date  ? WBC 15.3 (H) 02/07/2022  ? RBC 4.19 02/07/2022  ? HGB 12.3 02/07/2022  ? HCT 38.6 02/07/2022  ? MCV 92.1 02/07/2022  ? MCH 29.4 02/07/2022  ? PLT 264 02/07/2022  ? MCHC 31.9 02/07/2022  ? RDW 12.5 02/07/2022  ? LYMPHSABS 1.8 03/18/2015  ? MONOABS 0.6 03/18/2015  ? EOSABS 0.1 03/18/2015  ? BASOSABS 0.0 03/18/2015  ? ? ? ?Last metabolic panel ?Lab Results  ?Component Value Date  ? NA 136 02/06/2022  ? K 3.8 02/07/2022  ? CL 105 02/06/2022  ? CO2 23 02/06/2022  ? BUN 13 02/06/2022  ? CREATININE 0.65 02/06/2022  ? GLUCOSE 108 (H) 02/06/2022  ? GFRNONAA >60 02/06/2022  ? GFRAA >60 02/13/2016  ? CALCIUM 8.7 (L) 02/06/2022  ? PROT 6.7 02/06/2022  ? ALBUMIN 3.9 02/06/2022  ? BILITOT 0.8 02/06/2022  ? ALKPHOS 51 02/06/2022  ? AST 22 02/06/2022  ?  ALT 17 02/06/2022  ? ANIONGAP 8 02/06/2022  ? ? ?GFR: ?Estimated Creatinine Clearance: 91.7 mL/min (by C-G formula based on SCr of 0.65 mg/dL). ? ?Recent Results (from the past 240 hour(s))  ?Resp Panel by RT-PCR (Flu A&B, Covid) Nasopharyngeal Swab     Status: None  ?  Collection Time: 02/06/22 10:06 AM  ? Specimen: Nasopharyngeal Swab; Nasopharyngeal(NP) swabs in vial transport medium  ?Result Value Ref Range Status  ? SARS Coronavirus 2 by RT PCR NEGATIVE NEGATIVE Final  ?  Comment: (NOTE) ?SARS-CoV-2 target nucleic acids are NOT DETECTED. ? ?The SARS-CoV-2 RNA is generally detectable in upper respiratory ?specimens during the acute phase of infection. The lowest ?concentration of SARS-CoV-2 viral copies this assay can detect is ?138 copies/mL. A negative result does not preclude SARS-Cov-2 ?infection and should not be used as the sole basis for treatment or ?other patient management decisions. A negative result may occur with  ?improper specimen collection/handling, submission of specimen other ?than nasopharyngeal swab, presence of viral mutation(s) within the ?areas targeted by this assay, and inadequate number of viral ?copies(<138 copies/mL). A negative result must be combined with ?clinical observations, patient history, and epidemiological ?information. The expected result is Negative. ? ?Fact Sheet for Patients:  ?EntrepreneurPulse.com.au ? ?Fact Sheet for Healthcare Providers:  ?IncredibleEmployment.be ? ?This test is no t yet approved or cleared by the [place] FDA and  ?has been authorized for detection and/or diagnosis of SARS-CoV-2 by ?FDA under an Emergency Use Authorization (EUA). This EUA will remain  ?in effect (meaning this test can be used) for the duration of the ?COVID-19 declaration under Section 564(b)(1) of the Act, 21 ?U.S.C.section 360bbb-3(b)(1), unless the authorization is terminated  ?or revoked sooner.  ? ? ?  ? Influenza A by PCR NEGATIVE NEGATIVE Final  ? Influenza B by PCR NEGATIVE NEGATIVE Final  ?  Comment: (NOTE) ?The Xpert Xpress SARS-CoV-2/FLU/RSV plus assay is intended as an aid ?in the diagnosis of influenza from Nasopharyngeal swab specimens and ?should not be used as a sole basis for treatment.  Nasal washings and ?aspirates are unacceptable for Xpert Xpress SARS-CoV-2/FLU/RSV ?testing. ? ?Fact Sheet for Patients: ?EntrepreneurPulse.com.au ? ?Fact Sheet for Healthcare Providers: ?IncredibleEmployment.be ? ?This test is not yet approved or cleared by the [place] FDA and ?has been authorized for detection and/or diagnosis of SARS-CoV-2 by ?FDA under an Emergency Use Authorization (EUA). This EUA will remain ?in effect (meaning this test can be used) for the duration of the ?COVID-19 declaration under Section 564(b)(1) of the Act, 21 U.S.C. ?section 360bbb-3(b)(1), unless the authorization is terminated or ?revoked. ? ?Performed at [facility], [address] ?[place], [place] [address] ?  ?  ? ? ?Radiology Studies: ?CT Renal Stone Study ? ?Result Date: 02/05/2022 ?CLINICAL DATA:  Left abdominal pain, flank pain EXAM: CT ABDOMEN AND PELVIS WITHOUT CONTRAST TECHNIQUE: Multidetector CT imaging of the abdomen and pelvis was performed following the standard protocol without IV contrast. RADIATION DOSE REDUCTION: This exam was performed according to the departmental dose-optimization program which includes automated exposure control, adjustment of the mA and/or kV according to patient size and/or use of iterative reconstruction technique. COMPARISON:  None. FINDINGS: Lower chest: Linear atelectasis in the lung bases.  No effusions. Hepatobiliary: No focal hepatic abnormality. Gallbladder unremarkable. Pancreas: No focal abnormality or ductal dilatation. Spleen: No focal abnormality.  Normal size. Adrenals/Urinary Tract: Adrenal glands and right kidney unremarkable. There is a subcapsular left perinephric hematoma measuring up to 1.9 cm in thickness.  Exophytic left upper pole renal mass noted which is possibly the source of the perinephric hematoma. This measures 3.2 cm and cannot be characterized on this noncontrast study. No hydronephrosis. Urinary  bladder unremarkable. Stomach/Bowel: Stomach, large and small bowel grossly unremarkable. Vascular/Lymphatic: No evidence of aneurysm or adenopathy. Reproductive: Enlarged fibroid uterus. Large exophytic fibroid off the lef

## 2022-02-08 DIAGNOSIS — T148XXA Other injury of unspecified body region, initial encounter: Secondary | ICD-10-CM | POA: Diagnosis not present

## 2022-02-08 DIAGNOSIS — I10 Essential (primary) hypertension: Secondary | ICD-10-CM | POA: Diagnosis not present

## 2022-02-08 DIAGNOSIS — S37019A Minor contusion of unspecified kidney, initial encounter: Secondary | ICD-10-CM | POA: Diagnosis not present

## 2022-02-08 DIAGNOSIS — F32A Depression, unspecified: Secondary | ICD-10-CM | POA: Diagnosis not present

## 2022-02-08 LAB — PROCALCITONIN: Procalcitonin: 0.15 ng/mL

## 2022-02-08 LAB — CBC WITH DIFFERENTIAL/PLATELET
Abs Immature Granulocytes: 0.06 10*3/uL (ref 0.00–0.07)
Basophils Absolute: 0 10*3/uL (ref 0.0–0.1)
Basophils Relative: 0 %
Eosinophils Absolute: 0 10*3/uL (ref 0.0–0.5)
Eosinophils Relative: 0 %
HCT: 36.6 % (ref 36.0–46.0)
Hemoglobin: 11.6 g/dL — ABNORMAL LOW (ref 12.0–15.0)
Immature Granulocytes: 0 %
Lymphocytes Relative: 16 %
Lymphs Abs: 2.3 10*3/uL (ref 0.7–4.0)
MCH: 29.3 pg (ref 26.0–34.0)
MCHC: 31.7 g/dL (ref 30.0–36.0)
MCV: 92.4 fL (ref 80.0–100.0)
Monocytes Absolute: 1.6 10*3/uL — ABNORMAL HIGH (ref 0.1–1.0)
Monocytes Relative: 11 %
Neutro Abs: 10.2 10*3/uL — ABNORMAL HIGH (ref 1.7–7.7)
Neutrophils Relative %: 73 %
Platelets: 251 10*3/uL (ref 150–400)
RBC: 3.96 MIL/uL (ref 3.87–5.11)
RDW: 12.5 % (ref 11.5–15.5)
WBC: 14.2 10*3/uL — ABNORMAL HIGH (ref 4.0–10.5)
nRBC: 0 % (ref 0.0–0.2)

## 2022-02-08 LAB — URINE CULTURE: Culture: <10000 — AB

## 2022-02-08 MED ORDER — DOCUSATE SODIUM 100 MG PO CAPS
100.0000 mg | ORAL_CAPSULE | Freq: Once | ORAL | Status: AC
  Administered 2022-02-08: 100 mg via ORAL
  Filled 2022-02-08: qty 1

## 2022-02-08 NOTE — Plan of Care (Signed)
  Problem: Clinical Measurements: Goal: Ability to maintain clinical measurements within normal limits will improve Outcome: Progressing   Problem: Activity: Goal: Risk for activity intolerance will decrease Outcome: Progressing   Problem: Coping: Goal: Level of anxiety will decrease Outcome: Progressing   

## 2022-02-08 NOTE — Progress Notes (Signed)
? ?PROGRESS NOTE ? ? ? ?Tracy Perez  GDJ:[id number] DOB: September 27, 1966 DOA: 02/05/2022 ?PCP: Berkley Harvey, NP ? ? ?Brief Narrative: ?Tracy Perez is a 56 y.o. female with a history of breast cancer in remission, hypertension, hyperlipidemia, depression, anxiety. Patient presented secondary to left flank pain and was found to have a left perinephric hematoma with associated exophytic lesion. Urology consulted and have recommended pain control and monitoring hemoglobin at this time with eventual follow-up with urology as an outpatient for repeat imaging. Patient has now developed fever, on top of previously elevated WBC, concerning for sepsis picture. Workup initiated. ? ? ?Assessment and Plan: ?* Perinephric hematoma ?Left sided. Noted on CT abdomen/pelvis. Normal hemoglobin. Urology consulted on admission and recommend observation in addition to outpatient follow-up in 6 weeks for repeat imaging. Hemoglobin is stable. ?-Continue morphine IV ?-Norco PRN ?-CBC in AM ? ?Sepsis ([place]) ?Not present on admission. Unsure of source, but possibly urinary. No symptoms otherwise. Associated fever and leukocytosis. Tmax of 100.7 ?F in the last 24 hours. Procalcitonin mildly elevated today. RVP negative. ?-Urine and blood cultures pending ?-Ceftriaxone for possible urinary source ? ?Leukocytosis ?See problem, Sepsis ? ?Hyperlipidemia ?-Continue home pravastatin ? ?Essential hypertension ?Patient is on losartan, amlodipine and chlorthalidone. Blood pressure controlled. ?-Continue home losartan and hydralazine ? ?Depressive disorder ?-Continue home Lexapro ? ? ? ?DVT prophylaxis: SCDs ?Code Status:   Code Status: Full Code ?Family Communication: None at bedside ?Disposition Plan: Discharge home pending sepsis workup. Possibly in 1-3 days pending workup results ? ? ?Consultants:  ?Urology ? ?Procedures:  ?None ? ?Antimicrobials: ?Ceftriaxone IV  ? ? ?Subjective: ?Continues to have fever. No new symptoms. Continued left  flank pain. ? ?Objective: ?BP 110/78 (BP Location: Right Arm)   Pulse 92   Temp (!) 100.7 ?F (38.2 ?C) (Oral)   Resp 18   Ht '5\' 7"'$  (1.702 m)   Wt 94.8 kg   LMP 02/10/2013   SpO2 97%   BMI 32.73 kg/m?  ? ?Examination: ? ?General exam: Appears calm and comfortable ?Respiratory system: Clear to auscultation. Respiratory effort normal. ?Cardiovascular system: S1 & S2 heard, RRR. No murmurs, rubs, gallops or clicks. ?Gastrointestinal system: Abdomen is distended, soft and nontender. Normal bowel sounds heard. ?Central nervous system: Alert and oriented. No focal neurological deficits. ?Musculoskeletal: No edema. No calf tenderness ?Skin: No cyanosis. No rashes ?Psychiatry: Judgement and insight appear normal. Mood & affect appropriate.  ? ? ?Data Reviewed: I have personally reviewed following labs and imaging studies ? ?CBC ?Lab Results  ?Component Value Date  ? WBC 14.2 (H) 02/08/2022  ? RBC 3.96 02/08/2022  ? HGB 11.6 (L) 02/08/2022  ? HCT 36.6 02/08/2022  ? MCV 92.4 02/08/2022  ? MCH 29.3 02/08/2022  ? PLT 251 02/08/2022  ? MCHC 31.7 02/08/2022  ? RDW 12.5 02/08/2022  ? LYMPHSABS 2.3 02/08/2022  ? MONOABS 1.6 (H) 02/08/2022  ? EOSABS 0.0 02/08/2022  ? BASOSABS 0.0 02/08/2022  ? ? ? ?Last metabolic panel ?Lab Results  ?Component Value Date  ? NA 136 02/06/2022  ? K 3.8 02/07/2022  ? CL 105 02/06/2022  ? CO2 23 02/06/2022  ? BUN 13 02/06/2022  ? CREATININE 0.65 02/06/2022  ? GLUCOSE 108 (H) 02/06/2022  ? GFRNONAA >60 02/06/2022  ? GFRAA >60 02/13/2016  ? CALCIUM 8.7 (L) 02/06/2022  ? PROT 6.7 02/06/2022  ? ALBUMIN 3.9 02/06/2022  ? BILITOT 0.8 02/06/2022  ? ALKPHOS 51 02/06/2022  ? AST 22 02/06/2022  ? ALT 17 02/06/2022  ?  ANIONGAP 8 02/06/2022  ? ? ?GFR: ?Estimated Creatinine Clearance: 91.7 mL/min (by C-G formula based on SCr of 0.65 mg/dL). ? ?Recent Results (from the past 240 hour(s))  ?Resp Panel by RT-PCR (Flu A&B, Covid) Nasopharyngeal Swab     Status: None  ? Collection Time: 02/06/22 10:06 AM  ?  Specimen: Nasopharyngeal Swab; Nasopharyngeal(NP) swabs in vial transport medium  ?Result Value Ref Range Status  ? SARS Coronavirus 2 by RT PCR NEGATIVE NEGATIVE Final  ?  Comment: (NOTE) ?SARS-CoV-2 target nucleic acids are NOT DETECTED. ? ?The SARS-CoV-2 RNA is generally detectable in upper respiratory ?specimens during the acute phase of infection. The lowest ?concentration of SARS-CoV-2 viral copies this assay can detect is ?138 copies/mL. A negative result does not preclude SARS-Cov-2 ?infection and should not be used as the sole basis for treatment or ?other patient management decisions. A negative result may occur with  ?improper specimen collection/handling, submission of specimen other ?than nasopharyngeal swab, presence of viral mutation(s) within the ?areas targeted by this assay, and inadequate number of viral ?copies(<138 copies/mL). A negative result must be combined with ?clinical observations, patient history, and epidemiological ?information. The expected result is Negative. ? ?Fact Sheet for Patients:  ?EntrepreneurPulse.com.au ? ?Fact Sheet for Healthcare Providers:  ?IncredibleEmployment.be ? ?This test is no t yet approved or cleared by the [place] FDA and  ?has been authorized for detection and/or diagnosis of SARS-CoV-2 by ?FDA under an Emergency Use Authorization (EUA). This EUA will remain  ?in effect (meaning this test can be used) for the duration of the ?COVID-19 declaration under Section 564(b)(1) of the Act, 21 ?U.S.C.section 360bbb-3(b)(1), unless the authorization is terminated  ?or revoked sooner.  ? ? ?  ? Influenza A by PCR NEGATIVE NEGATIVE Final  ? Influenza B by PCR NEGATIVE NEGATIVE Final  ?  Comment: (NOTE) ?The Xpert Xpress SARS-CoV-2/FLU/RSV plus assay is intended as an aid ?in the diagnosis of influenza from Nasopharyngeal swab specimens and ?should not be used as a sole basis for treatment. Nasal washings and ?aspirates are  unacceptable for Xpert Xpress SARS-CoV-2/FLU/RSV ?testing. ? ?Fact Sheet for Patients: ?EntrepreneurPulse.com.au ? ?Fact Sheet for Healthcare Providers: ?IncredibleEmployment.be ? ?This test is not yet approved or cleared by the [place] FDA and ?has been authorized for detection and/or diagnosis of SARS-CoV-2 by ?FDA under an Emergency Use Authorization (EUA). This EUA will remain ?in effect (meaning this test can be used) for the duration of the ?COVID-19 declaration under Section 564(b)(1) of the Act, 21 U.S.C. ?section 360bbb-3(b)(1), unless the authorization is terminated or ?revoked. ? ?Performed at [facility], [address] ?[place], [place] [address] ?  ?Respiratory (~20 pathogens) panel by PCR     Status: None  ? Collection Time: 02/07/22  7:44 AM  ? Specimen: Nasopharyngeal Swab; Respiratory  ?Result Value Ref Range Status  ? Adenovirus NOT DETECTED NOT DETECTED Final  ? Coronavirus 229E NOT DETECTED NOT DETECTED Final  ?  Comment: (NOTE) ?The Coronavirus on the Respiratory Panel, DOES NOT test for the novel  ?Coronavirus (2019 nCoV) ?  ? Coronavirus HKU1 NOT DETECTED NOT DETECTED Final  ? Coronavirus NL63 NOT DETECTED NOT DETECTED Final  ? Coronavirus OC43 NOT DETECTED NOT DETECTED Final  ? Metapneumovirus NOT DETECTED NOT DETECTED Final  ? Rhinovirus / Enterovirus NOT DETECTED NOT DETECTED Final  ? Influenza A NOT DETECTED NOT DETECTED Final  ? Influenza B NOT DETECTED NOT DETECTED Final  ? Parainfluenza Virus 1 NOT DETECTED NOT DETECTED Final  ?  Parainfluenza Virus 2 NOT DETECTED NOT DETECTED Final  ? Parainfluenza Virus 3 NOT DETECTED NOT DETECTED Final  ? Parainfluenza Virus 4 NOT DETECTED NOT DETECTED Final  ? Respiratory Syncytial Virus NOT DETECTED NOT DETECTED Final  ? Bordetella pertussis NOT DETECTED NOT DETECTED Final  ? Bordetella Parapertussis NOT DETECTED NOT DETECTED Final  ? Chlamydophila pneumoniae NOT DETECTED NOT DETECTED  Final  ? Mycoplasma pneumoniae NOT DETECTED NOT DETECTED Final  ?  Comment: Performed at [facility], [place] [address] [place], [place] [address]  ?Culture, blood (routine x 2)     Status: None (Preliminary result)  ? C

## 2022-02-09 ENCOUNTER — Inpatient Hospital Stay (HOSPITAL_COMMUNITY): Payer: BC Managed Care – PPO

## 2022-02-09 DIAGNOSIS — T148XXA Other injury of unspecified body region, initial encounter: Secondary | ICD-10-CM | POA: Diagnosis not present

## 2022-02-09 DIAGNOSIS — I10 Essential (primary) hypertension: Secondary | ICD-10-CM | POA: Diagnosis not present

## 2022-02-09 DIAGNOSIS — S37019A Minor contusion of unspecified kidney, initial encounter: Secondary | ICD-10-CM | POA: Diagnosis not present

## 2022-02-09 DIAGNOSIS — F32A Depression, unspecified: Secondary | ICD-10-CM | POA: Diagnosis not present

## 2022-02-09 LAB — BASIC METABOLIC PANEL
Anion gap: 7 (ref 5–15)
BUN: 11 mg/dL (ref 6–20)
CO2: 28 mmol/L (ref 22–32)
Calcium: 8.9 mg/dL (ref 8.9–10.3)
Chloride: 102 mmol/L (ref 98–111)
Creatinine, Ser: 0.95 mg/dL (ref 0.44–1.00)
GFR, Estimated: >60 mL/min (ref >60)
Glucose, Bld: 134 mg/dL — ABNORMAL HIGH (ref 70–99)
Potassium: 3.6 mmol/L (ref 3.5–5.1)
Sodium: 137 mmol/L (ref 135–145)

## 2022-02-09 LAB — CBC
HCT: 33.9 % — ABNORMAL LOW (ref 36.0–46.0)
Hemoglobin: 11 g/dL — ABNORMAL LOW (ref 12.0–15.0)
MCH: 29.3 pg (ref 26.0–34.0)
MCHC: 32.4 g/dL (ref 30.0–36.0)
MCV: 90.2 fL (ref 80.0–100.0)
Platelets: 294 10*3/uL (ref 150–400)
RBC: 3.76 MIL/uL — ABNORMAL LOW (ref 3.87–5.11)
RDW: 12.4 % (ref 11.5–15.5)
WBC: 12.2 10*3/uL — ABNORMAL HIGH (ref 4.0–10.5)
nRBC: 0 % (ref 0.0–0.2)

## 2022-02-09 LAB — PROCALCITONIN: Procalcitonin: <0.1 ng/mL

## 2022-02-09 MED ORDER — POLYETHYLENE GLYCOL 3350 17 G PO PACK
17.0000 g | PACK | Freq: Every day | ORAL | Status: DC
  Administered 2022-02-09 – 2022-02-10 (×2): 17 g via ORAL
  Filled 2022-02-09 (×2): qty 1

## 2022-02-09 MED ORDER — LOPERAMIDE HCL 2 MG PO CAPS
2.0000 mg | ORAL_CAPSULE | Freq: Once | ORAL | Status: AC
  Administered 2022-02-09: 2 mg via ORAL
  Filled 2022-02-09: qty 1

## 2022-02-09 NOTE — Progress Notes (Signed)
? ?PROGRESS NOTE ? ? ? ?Tracy Perez  VZC:[id number] DOB: [birth year] DOA: 02/05/2022 ?PCP: Berkley Harvey, NP ? ? ?Brief Narrative: ?Tracy Perez is a 56 y.o. female with a history of breast cancer in remission, hypertension, hyperlipidemia, depression, anxiety. Patient presented secondary to left flank pain and was found to have a left perinephric hematoma with associated exophytic lesion. Urology consulted and have recommended pain control and monitoring hemoglobin at this time with eventual follow-up with urology as an outpatient for repeat imaging. Patient has now developed fever, on top of previously elevated WBC, concerning for sepsis picture. Workup initiated. ? ? ?Assessment and Plan: ?* Perinephric hematoma ?Left sided. Noted on CT abdomen/pelvis. Normal hemoglobin. Urology consulted on admission and recommend observation in addition to outpatient follow-up in 6 weeks for repeat imaging. Hemoglobin is stable. ?-Continue morphine IV ?-Norco PRN ?-CBC in AM ? ?Sepsis ([place]) ?Not present on admission. Unsure of source, but possibly urinary. No symptoms otherwise. Associated fever and leukocytosis. Tmax of 101.1 ?F in the last 24 hours. Procalcitonin mildly elevated now back undetectable. RVP negative. Urine culture with insignificant growth. Patient has allergies to CT/MRI contrast in addition to steroids and declined administration ?-Follow blood cultures ?-Repeat urine culture ?-CT chest/abdomen/pelvis w/o contrast; if negative will consult ID for recommendations ?-Ceftriaxone IV ?-Repeat CBC ? ?Leukocytosis ?See problem, Sepsis ? ?Hyperlipidemia ?-Continue home pravastatin ? ?Essential hypertension ?Patient is on losartan, amlodipine and chlorthalidone. Blood pressure controlled. ?-Continue home losartan and hydralazine ? ?Depressive disorder ?-Continue home Lexapro ? ? ? ?DVT prophylaxis: SCDs ?Code Status:   Code Status: Full Code ?Family Communication: Daughter and brother at  bedside ?Disposition Plan: Discharge home pending sepsis workup. Possibly in 1-2 days pending continued workup for sepsis ? ? ?Consultants:  ?Urology ? ?Procedures:  ?None ? ?Antimicrobials: ?Ceftriaxone IV  ? ? ?Subjective: ?Flank pain seems to be improving. No other symptoms noted. Had a bowel movement this morning. ? ?Objective: ?BP 116/79 (BP Location: Right Arm)   Pulse 90   Temp 98.7 ?F (37.1 ?C) (Oral)   Resp 18   Ht '5\' 7"'$  (1.702 m)   Wt 94.8 kg   LMP 02/10/2013   SpO2 100%   BMI 32.73 kg/m?  ? ?Examination: ? ?General exam: Appears calm and comfortable ?Respiratory system: Clear to auscultation. Respiratory effort normal. ?Cardiovascular system: S1 & S2 heard, RRR. No murmurs, rubs, gallops or clicks. ?Gastrointestinal system: Abdomen is nondistended, soft and nontender. No organomegaly or masses felt. Normal bowel sounds heard. Left flank tenderness ?Central nervous system: Alert and oriented. No focal neurological deficits. ?Musculoskeletal: No edema. No calf tenderness ?Skin: No cyanosis. No rashes ?Psychiatry: Judgement and insight appear normal. Mood & affect appropriate.  ? ? ?Data Reviewed: I have personally reviewed following labs and imaging studies ? ?CBC ?Lab Results  ?Component Value Date  ? WBC 12.2 (H) 02/09/2022  ? RBC 3.76 (L) 02/09/2022  ? HGB 11.0 (L) 02/09/2022  ? HCT 33.9 (L) 02/09/2022  ? MCV 90.2 02/09/2022  ? MCH 29.3 02/09/2022  ? PLT 294 02/09/2022  ? MCHC 32.4 02/09/2022  ? RDW 12.4 02/09/2022  ? LYMPHSABS 2.3 02/08/2022  ? MONOABS 1.6 (H) 02/08/2022  ? EOSABS 0.0 02/08/2022  ? BASOSABS 0.0 02/08/2022  ? ? ? ?Last metabolic panel ?Lab Results  ?Component Value Date  ? NA 136 02/06/2022  ? K 3.8 02/07/2022  ? CL 105 02/06/2022  ? CO2 23 02/06/2022  ? BUN 13 02/06/2022  ? CREATININE 0.65 02/06/2022  ? GLUCOSE  108 (H) 02/06/2022  ? GFRNONAA >60 02/06/2022  ? GFRAA >60 02/13/2016  ? CALCIUM 8.7 (L) 02/06/2022  ? PROT 6.7 02/06/2022  ? ALBUMIN 3.9 02/06/2022  ? BILITOT 0.8  02/06/2022  ? ALKPHOS 51 02/06/2022  ? AST 22 02/06/2022  ? ALT 17 02/06/2022  ? ANIONGAP 8 02/06/2022  ? ? ?GFR: ?Estimated Creatinine Clearance: 91.7 mL/min (by C-G formula based on SCr of 0.65 mg/dL). ? ?Recent Results (from the past 240 hour(s))  ?Resp Panel by RT-PCR (Flu A&B, Covid) Nasopharyngeal Swab     Status: None  ? Collection Time: 02/06/22 10:06 AM  ? Specimen: Nasopharyngeal Swab; Nasopharyngeal(NP) swabs in vial transport medium  ?Result Value Ref Range Status  ? SARS Coronavirus 2 by RT PCR NEGATIVE NEGATIVE Final  ?  Comment: (NOTE) ?SARS-CoV-2 target nucleic acids are NOT DETECTED. ? ?The SARS-CoV-2 RNA is generally detectable in upper respiratory ?specimens during the acute phase of infection. The lowest ?concentration of SARS-CoV-2 viral copies this assay can detect is ?138 copies/mL. A negative result does not preclude SARS-Cov-2 ?infection and should not be used as the sole basis for treatment or ?other patient management decisions. A negative result may occur with  ?improper specimen collection/handling, submission of specimen other ?than nasopharyngeal swab, presence of viral mutation(s) within the ?areas targeted by this assay, and inadequate number of viral ?copies(<138 copies/mL). A negative result must be combined with ?clinical observations, patient history, and epidemiological ?information. The expected result is Negative. ? ?Fact Sheet for Patients:  ?EntrepreneurPulse.com.au ? ?Fact Sheet for Healthcare Providers:  ?IncredibleEmployment.be ? ?This test is no t yet approved or cleared by the [place] FDA and  ?has been authorized for detection and/or diagnosis of SARS-CoV-2 by ?FDA under an Emergency Use Authorization (EUA). This EUA will remain  ?in effect (meaning this test can be used) for the duration of the ?COVID-19 declaration under Section 564(b)(1) of the Act, 21 ?U.S.C.section 360bbb-3(b)(1), unless the authorization is terminated  ?or  revoked sooner.  ? ? ?  ? Influenza A by PCR NEGATIVE NEGATIVE Final  ? Influenza B by PCR NEGATIVE NEGATIVE Final  ?  Comment: (NOTE) ?The Xpert Xpress SARS-CoV-2/FLU/RSV plus assay is intended as an aid ?in the diagnosis of influenza from Nasopharyngeal swab specimens and ?should not be used as a sole basis for treatment. Nasal washings and ?aspirates are unacceptable for Xpert Xpress SARS-CoV-2/FLU/RSV ?testing. ? ?Fact Sheet for Patients: ?EntrepreneurPulse.com.au ? ?Fact Sheet for Healthcare Providers: ?IncredibleEmployment.be ? ?This test is not yet approved or cleared by the [place] FDA and ?has been authorized for detection and/or diagnosis of SARS-CoV-2 by ?FDA under an Emergency Use Authorization (EUA). This EUA will remain ?in effect (meaning this test can be used) for the duration of the ?COVID-19 declaration under Section 564(b)(1) of the Act, 21 U.S.C. ?section 360bbb-3(b)(1), unless the authorization is terminated or ?revoked. ? ?Performed at [facility], [address] [address] ?[place], [place] [address] ?  ?Urine Culture     Status: Abnormal  ? Collection Time: 02/07/22  7:43 AM  ? Specimen: Urine, Clean Catch  ?Result Value Ref Range Status  ? Specimen Description   Final  ?  URINE, CLEAN CATCH ?Performed at [facility], [place] [address], [place], [place] [address] ?  ? Special Requests   Final  ?  NONE ?Performed at [facility], [place] [address], [place], [place] [address] ?  ? Culture (A)  Final  ?  <10,000 COLONIES/mL INSIGNIFICANT GROWTH ?  Performed at [facility], [place] [address] [place], [place] [address] ?  ? Report Status 02/08/2022 FINAL  Final  ?Respiratory (~20 pathogens) panel by PCR     Status: None  ? Collection Time: 02/07/22  7:44 AM  ? Specimen: Nasopharyngeal Swab; Respiratory  ?Result Value Ref Range Status  ? Adenovirus NOT DETECTED NOT DETECTED Final  ? Coronavirus 229E NOT DETECTED  NOT DETECTED Final  ?  Comment: (NOTE) ?The Coronavirus on the Respiratory Panel, DOES NOT test for the novel  ?Coronavirus (2019 nCoV) ?  ? Coronavirus HKU1 NOT DETECTED NOT DETECTED Final  ? Coronavirus NL63 NOT DETECTED NOT DET

## 2022-02-10 DIAGNOSIS — N2889 Other specified disorders of kidney and ureter: Secondary | ICD-10-CM

## 2022-02-10 LAB — CBC
HCT: 34.3 % — ABNORMAL LOW (ref 36.0–46.0)
Hemoglobin: 11.2 g/dL — ABNORMAL LOW (ref 12.0–15.0)
MCH: 29.5 pg (ref 26.0–34.0)
MCHC: 32.7 g/dL (ref 30.0–36.0)
MCV: 90.3 fL (ref 80.0–100.0)
Platelets: 327 10*3/uL (ref 150–400)
RBC: 3.8 MIL/uL — ABNORMAL LOW (ref 3.87–5.11)
RDW: 12.4 % (ref 11.5–15.5)
WBC: 11.7 10*3/uL — ABNORMAL HIGH (ref 4.0–10.5)
nRBC: 0 % (ref 0.0–0.2)

## 2022-02-10 LAB — URINE CULTURE: Culture: NO GROWTH

## 2022-02-10 MED ORDER — CEFDINIR 300 MG PO CAPS: 300.0000 mg | ORAL_CAPSULE | Freq: Two times a day (BID) | ORAL | Status: DC

## 2022-02-10 MED ORDER — CEFDINIR 300 MG PO CAPS: 300.0000 mg | ORAL_CAPSULE | Freq: Two times a day (BID) | ORAL | 0 refills | Status: AC

## 2022-02-10 MED ORDER — HYDROCODONE-ACETAMINOPHEN 5-325 MG PO TABS: 1.0000 | ORAL_TABLET | Freq: Four times a day (QID) | ORAL | 0 refills | Status: AC | PRN

## 2022-02-10 NOTE — Discharge Summary (Signed)
?Physician Discharge Summary ?  ?Patient: Tracy Perez MRN: [id number] DOB: [birth year]  ?Admit date:     02/05/2022  ?Discharge date: 02/10/22  ?Discharge Physician: Cordelia Poche, MD  ? ?PCP: Berkley Harvey, NP  ? ?Recommendations at discharge:  ? ?Outpatient follow-up with PCP, Urology and infectious disease ?Repeat CBC and BMP in 3-5 days ? ?Discharge Diagnoses: ?Principal Problem: ?  Perinephric hematoma ?Active Problems: ?  Sepsis ([place]) ?  Depressive disorder ?  Essential hypertension ?  Hyperlipidemia ?  Leukocytosis ?  Hematoma ?  Left kidney mass ? ?Resolved Problems: ?  * No resolved hospital problems. * ? ?Hospital Course: ?Tracy Perez is a 56 y.o. female with a history of breast cancer in remission, hypertension, hyperlipidemia, depression, anxiety. Patient presented secondary to left flank pain and was found to have a left perinephric hematoma with associated exophytic lesion. Urology consulted and have recommended pain control and monitoring hemoglobin at this time with eventual follow-up with urology as an outpatient for repeat imaging. Patient has now developed fever, on top of previously elevated WBC, concerning for sepsis picture. Workup initiated. ? ?Assessment and Plan: ?* Perinephric hematoma ?Left sided. Noted on CT abdomen/pelvis. Normal hemoglobin. Urology consulted on admission and recommend observation in addition to outpatient follow-up in 6 weeks for repeat imaging. Hemoglobin is stable. Follow-up with urology. ? ?Sepsis ([place]) ?Not present on admission. Unsure of source, but possibly urinary. No symptoms otherwise. Associated fever and leukocytosis. Tmax of 101.1 ?F in the last 24 hours. Procalcitonin mildly elevated now back undetectable. RVP negative. Urine culture with insignificant growth. Patient has allergies to CT/MRI contrast in addition to steroids and declined administration. Blood and urine cultures without source. ID consulted with recommendations for 14 days of  total antibiotics. Patient transitioned from Ceftriaxone to Cefdinir on discharge with recommendation for infectious disease follow-up. Leukocytosis trended down prior to discharge. ? ?Leukocytosis ?See problem, Sepsis ? ?Hyperlipidemia ?Continue home pravastatin ? ?Essential hypertension ?Patient is on losartan, amlodipine and chlorthalidone. Blood pressure controlled. Continue home regimen. ? ?Depressive disorder ?Continue home Lexapro ? ?Left kidney mass ?Noted on CT abdomen/pelvis. Concern for possible renal cell carcinoma. Urology consulted and plan for outpatient follow-up. Patient is aware of possible diagnosis. ? ? ? ? ?  ? ? ?Consultants: Urology, Infectious disease ?Procedures performed: None  ?Disposition: Home ?Diet recommendation: Regular diet ? ?DISCHARGE MEDICATION: ?Allergies as of 02/10/2022   ? ?   Reactions  ? Ibuprofen Other (See Comments)  ? Was told by provider not to take Ibuprofen  ? Iodinated Contrast Media Nausea And Vomiting  ?   ? Prednisolone Other (See Comments)  ? headache  ? Gadobutrol Nausea And Vomiting  ? MRI dye  ? Gadolinium Derivatives Nausea And Vomiting  ? Nausea and vomiting immediately following injection.  ?MRI Dye   ? Gadoversetamide Nausea And Vomiting  ? Nausea and vomiting immediately following injection.  ?MRI Dye   ? ?  ? ?  ?Medication List  ?  ? ?STOP taking these medications   ? ?acetaminophen 500 MG tablet ?Commonly known as: TYLENOL ?  ?ibuprofen 600 MG tablet ?Commonly known as: ADVIL ?  ?Lidocaine 4 % Ptch ?Commonly known as: HM Lidocaine Patch ?  ?methocarbamol 500 MG tablet ?Commonly known as: ROBAXIN ?  ? ?  ? ?TAKE these medications   ? ?albuterol 108 (90 Base) MCG/ACT inhaler ?Commonly known as: VENTOLIN HFA ?Inhale 2 puffs into the lungs every 6 (six) hours as needed for wheezing or shortness  of breath. ?  ?amLODipine 10 MG tablet ?Commonly known as: NORVASC ?Take 10 mg by mouth daily. ?  ?cefdinir 300 MG capsule ?Commonly known as: OMNICEF ?Take 1 capsule  (300 mg total) by mouth 2 (two) times daily for 10 days. ?Start taking on: February 11, 2022 ?  ?chlorthalidone 25 MG tablet ?Commonly known as: HYGROTON ?Take 12.5 mg by mouth daily. Reported on 04/28/2016 ?  ?escitalopram 20 MG tablet ?Commonly known as: LEXAPRO ?Take 20 mg by mouth daily. ?  ?HYDROcodone-acetaminophen 5-325 MG tablet ?Commonly known as: NORCO/VICODIN ?Take 1 tablet by mouth every 6 (six) hours as needed for up to 3 days for moderate pain or severe pain. ?  ?losartan 100 MG tablet ?Commonly known as: COZAAR ?Take 100 mg by mouth daily. ?What changed: Another medication with the same name was removed. Continue taking this medication, and follow the directions you see here. ?  ?pantoprazole 40 MG tablet ?Commonly known as: PROTONIX ?Take 1 tablet (40 mg total) by mouth 2 (two) times daily before a meal. Pantoprazole 40 mg twice daily ( before first and last meal of the day ) for 4 weeks, then once daily for 8 weeks. ?What changed: additional instructions ?  ?pravastatin 10 MG tablet ?Commonly known as: PRAVACHOL ?Take 10 mg by mouth daily. ?  ?VITAMIN D3 PO ?Take 1 capsule by mouth daily. ?  ? ?  ? ? Follow-up Information   ? ? [facility]. Schedule an appointment as soon as possible for a visit.   ?Contact information: ?[address] ?[place] ?[phone] ? ?  ?  ? ? Berkley Harvey, NP. Schedule an appointment as soon as possible for a visit in 1 week(s).   ?Specialty: Nurse Practitioner ?Why: For hospital follow-up ?Contact information: ?[place] ?[place] [address] ?[phone] ? ? ?  ?  ? ? Carlyle Basques, MD. Go to.   ?Specialty: Infectious Diseases ?Why: For hospital follow-up ?Contact information: ?[place] ?[address] ?[place] [address] ?[phone] ? ? ?  ?  ? ?  ?  ? ?  ? ?Discharge Exam: ?Danley Danker Weights  ? 02/05/22 1824  ?Weight: 94.8 kg  ? ?General exam: Appears calm and comfortable ?Respiratory system: Clear to  auscultation. Respiratory effort normal. ?Cardiovascular system: S1 & S2 heard, RRR. No murmurs, rubs, gallops or clicks. ?Gastrointestinal system: Abdomen is nondistended, soft and nontender. No organomegaly or masses felt. Normal bowel sounds heard. Left flank tenderness is mild-moderate ?Central nervous system: Alert and oriented. No focal neurological deficits. ?Musculoskeletal: No edema. No calf tenderness ?Skin: No cyanosis. No rashes ?Psychiatry: Judgement and insight appear normal. Mood & affect appropriate.  ? ?Condition at discharge: stable ? ?The results of significant diagnostics from this hospitalization (including imaging, microbiology, ancillary and laboratory) are listed below for reference.  ? ?Imaging Studies: ?CT Renal Stone Study ? ?Result Date: 02/05/2022 ?CLINICAL DATA:  Left abdominal pain, flank pain EXAM: CT ABDOMEN AND PELVIS WITHOUT CONTRAST TECHNIQUE: Multidetector CT imaging of the abdomen and pelvis was performed following the standard protocol without IV contrast. RADIATION DOSE REDUCTION: This exam was performed according to the departmental dose-optimization program which includes automated exposure control, adjustment of the mA and/or kV according to patient size and/or use of iterative reconstruction technique. COMPARISON:  None. FINDINGS: Lower chest: Linear atelectasis in the lung bases.  No effusions. Hepatobiliary: No focal hepatic abnormality. Gallbladder unremarkable. Pancreas: No focal abnormality or ductal dilatation. Spleen: No focal  abnormality.  Normal size. Adrenals/Urinary Tract: Adrenal glands and right kidney unremarkable. There is a subcapsular left perinephric hematoma measuring up to 1.9 cm in thickness. Exophytic left upper pole renal mass noted which is possibly the source of the perinephric hematoma. This measures 3.2 cm and cannot be characterized on this noncontrast study. No hydronephrosis. Urinary bladder unremarkable. Stomach/Bowel: Stomach, large and small  bowel grossly unremarkable. Vascular/Lymphatic: No evidence of aneurysm or adenopathy. Reproductive: Enlarged fibroid uterus. Large exophytic fibroid off the left fundus measuring up to 7.6 cm. Other: No

## 2022-02-10 NOTE — Clinical Social Work Note (Signed)
?  Transition of Care (TOC) Screening Note ? ? ?Patient Details  ?Name: Tracy Perez ?Date of Birth: September 27, 1966 ? ? ?Transition of Care (TOC) CM/SW Contact:    ?Trish Mage, LCSW ?Phone Number: ?02/10/2022, 4:20 PM ? ? ? ?Transition of Care Department [facility]) has reviewed patient and no TOC needs have been identified at this time. We will continue to monitor patient advancement through interdisciplinary progression rounds. If new patient transition needs arise, please place a TOC consult. ? ? ?

## 2022-02-10 NOTE — Discharge Instructions (Addendum)
Real Cons, ? ?You were in the hospital with left flank pain and we found that you have: ? ?A hematoma of your kidney: This should resolve on its own over time.  ?Renal mass: This is concerning for a renal cancer. The urologist will follow-up with you as an outpatient for continued management. ?Fevers: We could not find a definitive source, but it may be related to the hematoma of your kidney. You have improved but the infectious disease physician recommends continued antibiotics and outpatient follow-up. ?

## 2022-02-10 NOTE — Assessment & Plan Note (Signed)
Noted on CT abdomen/pelvis. Concern for possible renal cell carcinoma. Urology consulted and plan for outpatient follow-up. Patient is aware of possible diagnosis. ?

## 2022-02-10 NOTE — Consult Note (Signed)
?   ? ? ? ? ?[facility]   ? ?Date of Admission:  02/05/2022    ? ?Total days of antibiotics 4 ? Ceftriaxone 3/17 >> current  ?      ?      ?Reason for Consult: Possible infected hematoma    ?Referring Provider: Lonny Prude  ?Primary Care Provider: Berkley Harvey, NP  ? ?Assessment: ?Tracy Perez is a 56 y.o. female admitted with sudden onset left flank pain and discomfort found to have perinephritic hematoma in the setting of renal mass. It was also noted that she has had fevers and SIRS on presentation - treated initially with ceftriaxone for presumed GU source.  ?She has been afebrile now since 3/19 and largely has no symptoms aside from dulled pain in the left lower back/flank. There does not seem to be an alternative explanation for her fevers but seems to be responding to current treatment. On the presumption that the hematoma is infected will plan 2 weeks of treatment - transition to PO cefdinir BID (10-more days).  ? ?She has follow up planned with urology to repeat her CT scan in 6 weeks to see if resolved and planning partial nephrectomy. She has several questions in relation to possible cancer diagnosis (how many surgeries, will this hematoma be addressed then, what treatment is she looking at?).  ?Defer to urology for most accurate description of expected treatment.  ? ? ?Plan: ?Change to cefdinir PO x 10 more days ?FU with urology arranged.  ?  ? ?Principal Problem: ?  Perinephric hematoma ?Active Problems: ?  Depressive disorder ?  Essential hypertension ?  Hyperlipidemia ?  Hematoma ?  Leukocytosis ?  Sepsis ([place]) ? ? ? amLODipine  10 mg Oral Daily  ? [START ON 02/11/2022] cefdinir  300 mg Oral Q12H  ? escitalopram  20 mg Oral Daily  ? losartan  100 mg Oral Daily  ? pantoprazole  40 mg Oral BID AC  ? polyethylene glycol  17 g Oral Daily  ? pravastatin  10 mg Oral Daily  ? ? ?HPI: Tracy Perez is a 56 y.o. female admitted from home after she experienced severe left  sided flank pain that started last week. ? ?She states that she noticed some nausea and some constipation that resolved with prune juice then noticed some flank pain that started in the afternoon on Thursday. Friday she came to ER for evaluation\. In the ER she underwent CT scan which revealed left perinephritic hematoma with an exophytic lesion to the kidney. Dr. Abner Greenspan with urology was consulted and recommended admission with serial Hgbs to ensure not ongoing bleed. She was also noted to have fevers occasionally to 102 (she does not recall any symptoms and was unaware of her fevers) and leukocytosis.  ?COVID test negative. Her complete ROS is grossly negative aside from localizing symptoms to the left flank. She does feel well and hopeful to go home soon. She feels better - pain is still there but dulled.  ? ?She has a pmhx of breast cancer in remission, hypertension, hyperlipidemia, depression, anxiety.  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 Has not been around anyone with sick symptoms to her knowledge.  ? ? ?Review of Systems: ?Review of Systems  ?Constitutional:  Negative for chills and fever.  ?HENT:  Negative for tinnitus.   ?Eyes:  Negative for blurred vision and photophobia.  ?Respiratory:  Negative for cough and sputum production.   ?Cardiovascular:  Negative for  chest pain.  ?Gastrointestinal:  Negative for diarrhea, nausea and vomiting.  ?Genitourinary:  Positive for flank pain. Negative for dysuria.  ?Musculoskeletal:  Positive for back pain.  ?Skin:  Negative for rash.  ?Neurological:  Negative for headaches.  ? ?Past Medical History:  ?Diagnosis Date  ? Allergy   ? Anemia   ? Anxiety   ? Asthma   ? Breast cancer ([place]) 01/16/2013  ?  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 Colon polyps   ? Depression   ? Fibroids   ? GERD (gastroesophageal reflux disease)   ? H pylori ulcer 07/21/2021  ? H/O Doppler ultrasound  02/17/2016  ? left lower leg and right upper leg  ? HLD (hyperlipidemia)   ? Hypertension   ? Peripheral vascular disease ([place])   ? S/P radiation therapy 03/06/2013-04/20/2013  ? Left Breast  /50.4Gy in 28 fractions/ Left Breast Boost / 10 Gy in 5 fractions  ? Use of tamoxifen (Nolvadex)   ? ? ?Social History  ? ?Tobacco Use  ? Smoking status: Former  ?  Packs/day: 0.50  ?  Years: 9.00  ?  Pack years: 4.50  ?  Types: Cigarettes  ?  Quit date: 11/23/1989  ?  Years since quitting: 32.2  ? Smokeless tobacco: Never  ?Vaping Use  ? Vaping Use: Never used  ?Substance Use Topics  ? Alcohol use: Yes  ?  Alcohol/week: 0.0 standard drinks  ?  Comment: social  ? Drug use: No  ? ? ?Family History  ?Problem Relation Age of Onset  ? Asthma Mother   ? Allergies Mother   ? Hypertension Mother   ? Anuerysm Mother   ?     brain  ? Hyperlipidemia Mother   ? Cancer Father   ?     type unknown  ? Colon polyps Brother   ? Colon polyps Maternal Aunt   ? Breast cancer Maternal Aunt   ? Arthritis Maternal Aunt   ? Bipolar disorder Maternal Aunt   ? Depression Maternal Aunt   ? Arthritis Maternal Aunt   ? Allergies Daughter   ? Hypertension Daughter   ? Hypertension Son   ? Colon cancer Neg Hx   ? Rectal cancer Neg Hx   ? Stomach cancer Neg Hx   ? Esophageal cancer Neg Hx   ? ?Allergies  ?Allergen Reactions  ? Ibuprofen Other (See Comments)  ?  Was told by provider not to take Ibuprofen  ? Iodinated Contrast Media Nausea And Vomiting  ?   ?  ? Prednisolone Other (See Comments)  ?  headache ?  ? Gadobutrol Nausea And Vomiting  ?  MRI dye  ? Gadolinium Derivatives Nausea And Vomiting  ?  Nausea and vomiting immediately following injection.  ?MRI Dye   ? Gadoversetamide Nausea And Vomiting  ?  Nausea and vomiting immediately following injection.  ?MRI Dye   ? ? ?OBJECTIVE: ?Blood pressure 115/79, pulse 92, temperature 98.5 ?F (36.9 ?C), temperature source Oral, resp. rate 18, height '5\' 7"'$  (1.702 m), weight 94.8 kg, last menstrual period 02/10/2013,  SpO2 96 %. ? ?Physical Exam ?Vitals reviewed.  ?Constitutional:   ?   Appearance: She is well-developed.  ?HENT:  ?   Mouth/Throat:  ?   Mouth: No oral lesions.  ?   Dentition: Normal dentition. No dental abscesses.  ?   Pharynx: No oropharyngeal exudate.  ?Cardiovascular:  ?   Rate  and Rhythm: Normal rate and regular rhythm.  ?   Heart sounds: Normal heart sounds.  ?Pulmonary:  ?   Effort: Pulmonary effort is normal.  ?   Breath sounds: Normal breath sounds.  ?Abdominal:  ?   General: There is no distension.  ?   Palpations: Abdomen is soft.  ?   Tenderness: There is no abdominal tenderness.  ?Lymphadenopathy:  ?   Cervical: No cervical adenopathy.  ?Skin: ?   General: Skin is warm and dry.  ?   Findings: No rash.  ?Neurological:  ?   Mental Status: She is alert and oriented to person, place, and time.  ?Psychiatric:     ?   Judgment: Judgment normal.  ? ? ?Lab Results ?Lab Results  ?Component Value Date  ? WBC 11.7 (H) 02/10/2022  ? HGB 11.2 (L) 02/10/2022  ? HCT 34.3 (L) 02/10/2022  ? MCV 90.3 02/10/2022  ? PLT 327 02/10/2022  ?  ?Lab Results  ?Component Value Date  ? CREATININE 0.95 02/09/2022  ? BUN 11 02/09/2022  ? NA 137 02/09/2022  ? K 3.6 02/09/2022  ? CL 102 02/09/2022  ? CO2 28 02/09/2022  ?  ?Lab Results  ?Component Value Date  ? ALT 17 02/06/2022  ? AST 22 02/06/2022  ? ALKPHOS 51 02/06/2022  ? BILITOT 0.8 02/06/2022  ?  ? ?Microbiology: ?Recent Results (from the past 240 hour(s))  ?Resp Panel by RT-PCR (Flu A&B, Covid) Nasopharyngeal Swab     Status: None  ? Collection Time: 02/06/22 10:06 AM  ? Specimen: Nasopharyngeal Swab; Nasopharyngeal(NP) swabs in vial transport medium  ?Result Value Ref Range Status  ? SARS Coronavirus 2 by RT PCR NEGATIVE NEGATIVE Final  ?  Comment: (NOTE) ?SARS-CoV-2 target nucleic acids are NOT DETECTED. ? ?The SARS-CoV-2 RNA is generally detectable in upper respiratory ?specimens during the acute phase of infection. The lowest ?concentration of SARS-CoV-2 viral copies this  assay can detect is ?138 copies/mL. A negative result does not preclude SARS-Cov-2 ?infection and should not be used as the sole basis for treatment or ?other patient management decisions. A negative result ma

## 2022-02-10 NOTE — Progress Notes (Signed)
WEnt over discharge paperwork with patient.  Work note and AVS given to patient.  All questions answered.  VSS.   ?

## 2022-02-12 LAB — CULTURE, BLOOD (ROUTINE X 2)
Culture: NO GROWTH
Culture: NO GROWTH
Special Requests: ADEQUATE
Special Requests: ADEQUATE

## 2022-02-16 ENCOUNTER — Other Ambulatory Visit: Payer: Self-pay

## 2022-02-16 ENCOUNTER — Encounter: Payer: Self-pay | Admitting: Infectious Diseases

## 2022-02-16 ENCOUNTER — Ambulatory Visit (INDEPENDENT_AMBULATORY_CARE_PROVIDER_SITE_OTHER): Payer: BC Managed Care – PPO | Admitting: Infectious Diseases

## 2022-02-16 VITALS — BP 163/93 | HR 108 | Temp 98.3°F | Ht 67.0 in | Wt 212.0 lb

## 2022-02-16 DIAGNOSIS — D72829 Elevated white blood cell count, unspecified: Secondary | ICD-10-CM | POA: Diagnosis not present

## 2022-02-16 DIAGNOSIS — S37019A Minor contusion of unspecified kidney, initial encounter: Secondary | ICD-10-CM

## 2022-02-16 LAB — CBC WITH DIFFERENTIAL/PLATELET
Absolute Monocytes: 746 cells/uL (ref 200–950)
Basophils Absolute: 42 cells/uL (ref 0–200)
Basophils Relative: 0.4 %
Eosinophils Absolute: 189 cells/uL (ref 15–500)
Eosinophils Relative: 1.8 %
HCT: 36.6 % (ref 35.0–45.0)
Hemoglobin: 11.9 g/dL (ref 11.7–15.5)
Lymphs Abs: 1974 cells/uL (ref 850–3900)
MCH: 28.3 pg (ref 27.0–33.0)
MCHC: 32.5 g/dL (ref 32.0–36.0)
MCV: 87.1 fL (ref 80.0–100.0)
MPV: 9.3 fL (ref 7.5–12.5)
Monocytes Relative: 7.1 %
Neutro Abs: 7550 cells/uL (ref 1500–7800)
Neutrophils Relative %: 71.9 %
Platelets: 596 10*3/uL — ABNORMAL HIGH (ref 140–400)
RBC: 4.2 10*6/uL (ref 3.80–5.10)
RDW: 11.9 % (ref 11.0–15.0)
Total Lymphocyte: 18.8 %
WBC: 10.5 10*3/uL (ref 3.8–10.8)

## 2022-02-16 NOTE — Progress Notes (Signed)
? ?  ?Patient: Tracy Perez  ?DOB: 08-20-1966 ?MRN: [id number] ?PCP: Berkley Harvey, NP  ? ? ? ? ?Subjective:  ?Tracy Perez is a 56 y.o. female here for hospital follow up.  ? ?Ms. Hegeman was hospitalized at [facility] 02/05/22 after she experienced sudden onset left flank/low back pain. Imaging revealed concern for perinephritic hematoma in the setting of newly discovered renal mass concerning for malignancy. During this period of time she presented with SIRS (leukocytosis with fevers ~ 102 ?F ). Started on cefriaxone 1gm IV Q24h for presumed urinary source of infection. Over the course of treatment fevers abated and leukocytosis was down trending. We were asked to see her in consultation for further treatment recommendations and recommended another 10 days of PO cefdinir BID to complete 2 weeks total.  ? ?Plans were to return to urology team in 6 weeks to repeat CT scan to follow up hematoma to help with timeline of partial nephrectomy.  ? ?Since discharge from the hospital Ms. Louth has not had any more fevers. Her brother has been very helpful an supportive for her. She has taken temperatures daily and nothing > 100 deg. She has also noticed she has had less pain in her left flank than from the hospital.  ? ?She is tolerating her antibiotics well without any side effects.  ? ?Review of Systems  ?Constitutional:  Negative for chills and fever.  ?HENT:  Negative for tinnitus.   ?Eyes:  Negative for blurred vision and photophobia.  ?Respiratory:  Negative for cough and sputum production.   ?Cardiovascular:  Negative for chest pain.  ?Gastrointestinal:  Negative for diarrhea, nausea and vomiting.  ?Genitourinary:  Positive for flank pain (left - improved). Negative for dysuria.  ?Skin:  Negative for rash.  ?Neurological:  Negative for headaches.  ? ?Past Medical History:  ?Diagnosis Date  ? Allergy   ? Anemia   ? Anxiety   ? Asthma   ? Breast cancer ([place]) 01/16/2013  ?  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 Colon polyps   ? Depression   ? Fibroids   ? GERD (gastroesophageal reflux disease)   ? H pylori ulcer 07/21/2021  ? H/O Doppler ultrasound 02/17/2016  ? left lower leg and right upper leg  ? HLD (hyperlipidemia)   ? Hypertension   ? Peripheral vascular disease ([place])   ? S/P radiation therapy 03/06/2013-04/20/2013  ? Left Breast  /50.4Gy in 28 fractions/ Left Breast Boost / 10 Gy in 5 fractions  ? Use of tamoxifen (Nolvadex)   ? ? ?Outpatient Medications Prior to Visit  ?Medication Sig Dispense Refill  ? amLODipine (NORVASC) 10 MG tablet Take 10 mg by mouth daily.    ? cefdinir (OMNICEF) 300 MG capsule Take 1 capsule (300 mg total) by mouth 2 (two) times daily for 10 days. 20 capsule 0  ? chlorthalidone (HYGROTON) 25 MG tablet Take 12.5 mg by mouth daily. Reported on 04/28/2016    ? Cholecalciferol (VITAMIN D3 PO) Take 1 capsule by mouth daily.    ? escitalopram (LEXAPRO) 20 MG tablet Take 20 mg by mouth daily.    ? losartan (COZAAR) 100 MG tablet Take 100 mg by mouth daily.    ? pantoprazole (PROTONIX) 40 MG tablet Take 1 tablet (40 mg total) by mouth 2 (two) times daily before a meal. Pantoprazole 40 mg twice daily ( before first and last meal of the day )  for 4 weeks, then once daily for 8 weeks. (Patient taking differently: Take 40 mg by mouth 2 (two) times daily before a meal.) 60 tablet 2  ? POTASSIUM PO Take by mouth.    ? albuterol (VENTOLIN HFA) 108 (90 Base) MCG/ACT inhaler Inhale 2 puffs into the lungs every 6 (six) hours as needed for wheezing or shortness of breath.    ? pravastatin (PRAVACHOL) 10 MG tablet Take 10 mg by mouth daily.    ? ?No facility-administered medications prior to visit.  ?  ? ?Allergies  ?Allergen Reactions  ? Ibuprofen Other (See Comments)  ?  Was told by provider not to take Ibuprofen  ? Iodinated Contrast Media Nausea And Vomiting  ?   ?  ? Prednisolone Other (See Comments)  ?  headache ?  ?  Gadobutrol Nausea And Vomiting  ?  MRI dye  ? Gadolinium Derivatives Nausea And Vomiting  ?  Nausea and vomiting immediately following injection.  ?MRI Dye   ? Gadoversetamide Nausea And Vomiting  ?  Nausea and vomiting immediately following injection.  ?MRI Dye   ? ? ?Social History  ? ?Tobacco Use  ? Smoking status: Former  ?  Packs/day: 0.50  ?  Years: 9.00  ?  Pack years: 4.50  ?  Types: Cigarettes  ?  Quit date: 11/23/1989  ?  Years since quitting: 32.2  ? Smokeless tobacco: Never  ?Vaping Use  ? Vaping Use: Never used  ?Substance Use Topics  ? Alcohol use: Yes  ?  Alcohol/week: 0.0 standard drinks  ?  Comment: social  ? Drug use: No  ? ? ?Family History  ?Problem Relation Age of Onset  ? Asthma Mother   ? Allergies Mother   ? Hypertension Mother   ? Anuerysm Mother   ?     brain  ? Hyperlipidemia Mother   ? Cancer Father   ?     type unknown  ? Colon polyps Brother   ? Colon polyps Maternal Aunt   ? Breast cancer Maternal Aunt   ? Arthritis Maternal Aunt   ? Bipolar disorder Maternal Aunt   ? Depression Maternal Aunt   ? Arthritis Maternal Aunt   ? Allergies Daughter   ? Hypertension Daughter   ? Hypertension Son   ? Colon cancer Neg Hx   ? Rectal cancer Neg Hx   ? Stomach cancer Neg Hx   ? Esophageal cancer Neg Hx   ? ? ?Objective:  ? ?Vitals:  ? 02/16/22 1439  ?BP: (!) 163/93  ?Pulse: (!) 108  ?Temp: 98.3 ?F (36.8 ?C)  ?TempSrc: Oral  ?SpO2: 98%  ?Weight: 212 lb (96.2 kg)  ?Height: '5\' 7"'$  (1.702 m)  ? ?Body mass index is 33.2 kg/m?. ? ?Physical Exam ?Vitals reviewed.  ?Constitutional:   ?   Appearance: Normal appearance. She is not ill-appearing.  ?HENT:  ?   Mouth/Throat:  ?   Mouth: Mucous membranes are moist.  ?   Pharynx: Oropharynx is clear.  ?Eyes:  ?   General: No scleral icterus. ?Cardiovascular:  ?   Rate and Rhythm: Normal rate.  ?Pulmonary:  ?   Effort: Pulmonary effort is normal.  ?Neurological:  ?   Mental Status: She is oriented to person, place, and time.  ?Psychiatric:     ?   Mood and Affect:  Mood normal.     ?   Thought Content: Thought content normal.  ? ? ?Lab Results: ?Lab Results  ?Component Value Date  ?  WBC 11.7 (H) 02/10/2022  ? HGB 11.2 (L) 02/10/2022  ? HCT 34.3 (L) 02/10/2022  ? MCV 90.3 02/10/2022  ? PLT 327 02/10/2022  ?  ?Lab Results  ?Component Value Date  ? CREATININE 0.95 02/09/2022  ? BUN 11 02/09/2022  ? NA 137 02/09/2022  ? K 3.6 02/09/2022  ? CL 102 02/09/2022  ? CO2 28 02/09/2022  ?  ?Lab Results  ?Component Value Date  ? ALT 17 02/06/2022  ? AST 22 02/06/2022  ? ALKPHOS 51 02/06/2022  ? BILITOT 0.8 02/06/2022  ?  ? ?Assessment & Plan:  ? ?Problem List Items Addressed This Visit   ? ?  ? Unprioritized  ? Leukocytosis  ?  Repeat CBC today.  ?  ?  ? Perinephric hematoma - Primary  ?  In the setting of renal mass suspicious for renal cell carcinoma. Presumed infectious with onset of fevers and leukocytosis in the hospital. She did well on IV ceftriaxone inpatient and now on PO cefdinir. She is tolerating this well well.  ?Will have her to continue her current plan and follow up here as needed if she has any return of fevers.  ? ?  ?  ? Relevant Orders  ? CBC with Differential/Platelet  ? ? ?Janene Madeira, MSN, NP-C ?[facility] ?[facility] ?Pager: [phone] ?Office: [phone] ? ?02/16/22  ?3:28 PM ? ?

## 2022-02-16 NOTE — Assessment & Plan Note (Addendum)
In the setting of renal mass suspicious for renal cell carcinoma. Presumed infectious with onset of fevers and leukocytosis in the hospital. She did well on IV ceftriaxone inpatient and now on PO cefdinir. She is tolerating this well well.  ?Will have her to continue her current plan and follow up here as needed if she has any return of fevers.  ? ?

## 2022-02-16 NOTE — Patient Instructions (Addendum)
Glad your fevers have gone away. Please finish up your antibiotic as planned to complete 2 weeks  ? ?Renal Cell Carcinoma is the concern that we are looking at from your CT scan.  ? ?If you notice any return of fevers after stopping your antibiotics please let me know either by mychart message or a phone call.  ? ?Will keep you in our prayers!  ?

## 2022-02-16 NOTE — Assessment & Plan Note (Signed)
Repeat CBC today 

## 2022-02-17 ENCOUNTER — Telehealth: Payer: Self-pay

## 2022-02-17 NOTE — Telephone Encounter (Signed)
Patient called office regarding elevated platelets; would like to know if there is anything she can do at home to help decrease value.  ?Per NP there is nothing new that the patient has to do. Advised she have PCP recheck in three months  ?Tracy Perez, RMA ? ?

## 2022-02-19 ENCOUNTER — Telehealth: Payer: Self-pay

## 2022-02-19 NOTE — Telephone Encounter (Signed)
Patient called requesting a prescription for pain medication. Please advise. ? ? ?Lorrane Mccay P Orvetta Danielski, CMA ? ?

## 2022-02-19 NOTE — Telephone Encounter (Signed)
Patient advised to contact PCP for pain medication. Patient verbalized her understanding. ? ? ?Artis Beggs P Amaryah Mallen, CMA ? ?

## 2022-02-23 ENCOUNTER — Other Ambulatory Visit: Payer: Self-pay | Admitting: Nurse Practitioner

## 2022-02-23 DIAGNOSIS — Z1231 Encounter for screening mammogram for malignant neoplasm of breast: Secondary | ICD-10-CM

## 2022-03-01 ENCOUNTER — Other Ambulatory Visit: Payer: Self-pay | Admitting: Internal Medicine

## 2022-03-11 ENCOUNTER — Other Ambulatory Visit: Payer: Self-pay | Admitting: Urology

## 2022-03-11 DIAGNOSIS — D49512 Neoplasm of unspecified behavior of left kidney: Secondary | ICD-10-CM

## 2022-03-23 ENCOUNTER — Ambulatory Visit
Admission: RE | Admit: 2022-03-23 | Discharge: 2022-03-23 | Disposition: A | Discharge status: Home / Self Care | Payer: BC Managed Care – PPO | Source: Ambulatory Visit | Attending: Nurse Practitioner | Admitting: Nurse Practitioner

## 2022-03-23 DIAGNOSIS — Z1231 Encounter for screening mammogram for malignant neoplasm of breast: Secondary | ICD-10-CM

## 2022-03-31 ENCOUNTER — Ambulatory Visit
Admission: RE | Admit: 2022-03-31 | Discharge: 2022-03-31 | Disposition: A | Discharge status: Home / Self Care | Payer: BC Managed Care – PPO | Source: Ambulatory Visit | Attending: Urology | Admitting: Urology

## 2022-03-31 ENCOUNTER — Other Ambulatory Visit: Payer: Self-pay | Admitting: Urology

## 2022-03-31 ENCOUNTER — Other Ambulatory Visit: Payer: Self-pay | Admitting: Internal Medicine

## 2022-03-31 DIAGNOSIS — C652 Malignant neoplasm of left renal pelvis: Secondary | ICD-10-CM

## 2022-03-31 DIAGNOSIS — D49512 Neoplasm of unspecified behavior of left kidney: Secondary | ICD-10-CM

## 2022-03-31 MED ORDER — GADOBUTROL 1 MMOL/ML IV SOLN
10.0000 mL | Freq: Once | INTRAVENOUS | Status: AC | PRN
  Administered 2022-03-31: 10 mL via INTRAVENOUS

## 2022-04-09 ENCOUNTER — Other Ambulatory Visit: Payer: Self-pay | Admitting: Urology

## 2022-06-02 NOTE — Patient Instructions (Signed)
DUE TO COVID-19 ONLY TWO VISITORS  (aged 57 and older)  ARE ALLOWED TO COME WITH YOU AND STAY IN THE WAITING ROOM ONLY DURING PRE OP AND PROCEDURE.   **NO VISITORS ARE ALLOWED IN THE SHORT STAY AREA OR RECOVERY ROOM!!**  IF YOU WILL BE ADMITTED INTO THE HOSPITAL YOU ARE ALLOWED ONLY FOUR SUPPORT PEOPLE DURING VISITATION HOURS ONLY (7 AM -8PM)   The support person(s) must pass our screening, gel in and out, and wear a mask at all times, including in the patient's room. Patients must also wear a mask when staff or their support person are in the room. Visitors GUEST BADGE MUST BE WORN VISIBLY  One adult visitor may remain with you overnight and MUST be in the room by 8 P.M.     Your procedure is scheduled on: 06/15/22   Report to [facility] Main Entrance    Report to admitting at : 10:00 AM   Call this number if you have problems the morning of surgery [phone]   Do not eat food :After Midnight.   After Midnight you may have the following liquids until : 9:00 AM DAY OF SURGERY  Water Black Coffee (sugar ok, NO MILK/CREAM OR CREAMERS)  Tea (sugar ok, NO MILK/CREAM OR CREAMERS) regular and decaf                             Plain Jell-O (NO RED)                                           Fruit ices (not with fruit pulp, NO RED)                                     Popsicles (NO RED)                                                                  Juice: apple, WHITE grape, WHITE cranberry Sports drinks like Gatorade (NO RED) Clear broth(vegetable,chicken,beef)  FOLLOW BOWEL PREP AND ANY ADDITIONAL PRE OP INSTRUCTIONS YOU RECEIVED FROM YOUR SURGEON'S OFFICE!!!     Oral Hygiene is also important to reduce your risk of infection.                                    Remember - BRUSH YOUR TEETH THE MORNING OF SURGERY WITH YOUR REGULAR TOOTHPASTE   Do NOT smoke after Midnight   Take these medicines the morning of surgery with A SIP OF WATER:  escitalopram,amlodipine,pantoprazole,  DO NOT TAKE ANY ORAL DIABETIC MEDICATIONS DAY OF YOUR SURGERY  Bring CPAP mask and tubing day of surgery.                              You may not have any metal on your body including hair pins, jewelry, and body piercing  Do not wear make-up, lotions, powders, perfumes/cologne, or deodorant  Do not wear nail polish including gel and S&S, artificial/acrylic nails, or any other type of covering on natural nails including finger and toenails. If you have artificial nails, gel coating, etc. that needs to be removed by a nail salon please have this removed prior to surgery or surgery may need to be canceled/ delayed if the surgeon/ anesthesia feels like they are unable to be safely monitored.   Do not shave  48 hours prior to surgery.    Do not bring valuables to the hospital. [place].   Contacts, dentures or bridgework may not be worn into surgery.   Bring small overnight bag day of surgery.   DO NOT [place]. PHARMACY WILL DISPENSE MEDICATIONS LISTED ON YOUR MEDICATION LIST TO YOU DURING YOUR ADMISSION [place]!    Patients discharged on the day of surgery will not be allowed to drive home.  Someone NEEDS to stay with you for the first 24 hours after anesthesia.   Special Instructions: Bring a copy of your healthcare power of attorney and living will documents         the day of surgery if you haven't scanned them before.              Please read over the following fact sheets you were given: IF YOU HAVE QUESTIONS ABOUT YOUR PRE-OP INSTRUCTIONS PLEASE CALL [phone]     [facility] - Preparing for Surgery Before surgery, you can play an important role.  Because skin is not sterile, your skin needs to be as free of germs as possible.  You can reduce the number of germs on your skin by washing with CHG (chlorahexidine gluconate) soap before  surgery.  CHG is an antiseptic cleaner which kills germs and bonds with the skin to continue killing germs even after washing. Please DO NOT use if you have an allergy to CHG or antibacterial soaps.  If your skin becomes reddened/irritated stop using the CHG and inform your nurse when you arrive at Short Stay. Do not shave (including legs and underarms) for at least 48 hours prior to the first CHG shower.  You may shave your face/neck. Please follow these instructions carefully:  1.  Shower with CHG Soap the night before surgery and the  morning of Surgery.  2.  If you choose to wash your hair, wash your hair first as usual with your  normal  shampoo.  3.  After you shampoo, rinse your hair and body thoroughly to remove the  shampoo.                           4.  Use CHG as you would any other liquid soap.  You can apply chg directly  to the skin and wash                       Gently with a scrungie or clean washcloth.  5.  Apply the CHG Soap to your body ONLY FROM THE NECK DOWN.   Do not use on face/ open                           Wound or open sores. Avoid contact with  eyes, ears mouth and genitals (private parts).                       Wash face,  Genitals (private parts) with your normal soap.             6.  Wash thoroughly, paying special attention to the area where your surgery  will be performed.  7.  Thoroughly rinse your body with warm water from the neck down.  8.  DO NOT shower/wash with your normal soap after using and rinsing off  the CHG Soap.                9.  Pat yourself dry with a clean towel.            10.  Wear clean pajamas.            11.  Place clean sheets on your bed the night of your first shower and do not  sleep with pets. Day of Surgery : Do not apply any lotions/deodorants the morning of surgery.  Please wear clean clothes to the hospital/surgery center.  FAILURE TO FOLLOW THESE INSTRUCTIONS MAY RESULT IN THE CANCELLATION OF YOUR SURGERY PATIENT  SIGNATURE_________________________________  NURSE SIGNATURE__________________________________  ________________________________________________________________________   WHAT IS A BLOOD TRANSFUSION? Blood Transfusion Information  A transfusion is the replacement of blood or some of its parts. Blood is made up of multiple cells which provide different functions. Red blood cells carry oxygen and are used for blood loss replacement. White blood cells fight against infection. Platelets control bleeding. Plasma helps clot blood. Other blood products are available for specialized needs, such as hemophilia or other clotting disorders. BEFORE THE TRANSFUSION  Who gives blood for transfusions?  Healthy volunteers who are fully evaluated to make sure their blood is safe. This is blood bank blood. Transfusion therapy is the safest it has ever been in the practice of medicine. Before blood is taken from a donor, a complete history is taken to make sure that person has no history of diseases nor engages in risky social behavior (examples are intravenous drug use or sexual activity with multiple partners). The donor's travel history is screened to minimize risk of transmitting infections, such as malaria. The donated blood is tested for signs of infectious diseases, such as HIV and hepatitis. The blood is then tested to be sure it is compatible with you in order to minimize the chance of a transfusion reaction. If you or a relative donates blood, this is often done in anticipation of surgery and is not appropriate for emergency situations. It takes many days to process the donated blood. RISKS AND COMPLICATIONS Although transfusion therapy is very safe and saves many lives, the main dangers of transfusion include:  Getting an infectious disease. Developing a transfusion reaction. This is an allergic reaction to something in the blood you were given. Every precaution is taken to prevent this. The decision to  have a blood transfusion has been considered carefully by your caregiver before blood is given. Blood is not given unless the benefits outweigh the risks. AFTER THE TRANSFUSION Right after receiving a blood transfusion, you will usually feel much better and more energetic. This is especially true if your red blood cells have gotten low (anemic). The transfusion raises the level of the red blood cells which carry oxygen, and this usually causes an energy increase. The nurse administering the transfusion will monitor you carefully for complications. HOME CARE INSTRUCTIONS  No  special instructions are needed after a transfusion. You may find your energy is better. Speak with your caregiver about any limitations on activity for underlying diseases you may have. SEEK MEDICAL CARE IF:  Your condition is not improving after your transfusion. You develop redness or irritation at the intravenous (IV) site. SEEK IMMEDIATE MEDICAL CARE IF:  Any of the following symptoms occur over the next 12 hours: Shaking chills. You have a temperature by mouth above 102 F (38.9 C), not controlled by medicine. Chest, back, or muscle pain. People around you feel you are not acting correctly or are confused. Shortness of breath or difficulty breathing. Dizziness and fainting. You get a rash or develop hives. You have a decrease in urine output. Your urine turns a dark color or changes to pink, red, or brown. Any of the following symptoms occur over the next 10 days: You have a temperature by mouth above 102 F (38.9 C), not controlled by medicine. Shortness of breath. Weakness after normal activity. The white part of the eye turns yellow (jaundice). You have a decrease in the amount of urine or are urinating less often. Your urine turns a dark color or changes to pink, red, or brown. Document Released: 11/06/2000 Document Revised: 02/01/2012 Document Reviewed: 06/25/2008 [facility] Patient Information 2014  [place], [place].  _______________________________________________________________________

## 2022-06-03 ENCOUNTER — Other Ambulatory Visit: Payer: Self-pay

## 2022-06-03 ENCOUNTER — Encounter (HOSPITAL_COMMUNITY)
Admission: RE | Admit: 2022-06-03 | Discharge: 2022-06-03 | Disposition: A | Discharge status: Home / Self Care | Payer: BC Managed Care – PPO | Source: Ambulatory Visit | Attending: Urology | Admitting: Urology

## 2022-06-03 ENCOUNTER — Encounter (HOSPITAL_COMMUNITY): Payer: Self-pay

## 2022-06-03 VITALS — BP 113/88 | HR 84 | Temp 98.5°F | Ht 67.0 in | Wt 214.0 lb

## 2022-06-03 DIAGNOSIS — Z01818 Encounter for other preprocedural examination: Secondary | ICD-10-CM | POA: Insufficient documentation

## 2022-06-03 DIAGNOSIS — I10 Essential (primary) hypertension: Secondary | ICD-10-CM

## 2022-06-03 HISTORY — DX: Chronic kidney disease, unspecified: N18.9

## 2022-06-03 LAB — CBC
HCT: 43.1 % (ref 36.0–46.0)
Hemoglobin: 13.8 g/dL (ref 12.0–15.0)
MCH: 28.6 pg (ref 26.0–34.0)
MCHC: 32 g/dL (ref 30.0–36.0)
MCV: 89.2 fL (ref 80.0–100.0)
Platelets: 314 10*3/uL (ref 150–400)
RBC: 4.83 MIL/uL (ref 3.87–5.11)
RDW: 14 % (ref 11.5–15.5)
WBC: 7.9 10*3/uL (ref 4.0–10.5)
nRBC: 0 % (ref 0.0–0.2)

## 2022-06-03 LAB — TYPE AND SCREEN
ABO/RH(D): O NEG
Antibody Screen: NEGATIVE

## 2022-06-03 NOTE — Progress Notes (Signed)
For Short Stay: [place] appointment date: Date of COVID positive in last 64 days:  Bowel Prep reminder:   For Anesthesia: PCP - Eldridge Abrahams: NP. LOV: 05/28/22 Cardiologist -   Chest x-ray - 04/01/22 EKG -  Stress Test -  ECHO -  Cardiac Cath -  Pacemaker/ICD device last checked: Pacemaker orders received: Device Rep notified:  Spinal Cord Stimulator:  Sleep Study -  CPAP -   Fasting Blood Sugar -  Checks Blood Sugar _____ times a day Date and result of last Hgb A1c-  Blood Thinner Instructions: Aspirin Instructions: Last Dose:  Activity level: Can go up a flight of stairs and activities of daily living without stopping and without chest pain and/or shortness of breath   Able to exercise without chest pain and/or shortness of breath   Unable to go up a flight of stairs without chest pain and/or shortness of breath     Anesthesia review: Hx: HTN,PVD  Patient denies shortness of breath, fever, cough and chest pain at PAT appointment   Patient verbalized understanding of instructions that were given to them at the PAT appointment. Patient was also instructed that they will need to review over the PAT instructions again at home before surgery.

## 2022-06-15 ENCOUNTER — Observation Stay (HOSPITAL_COMMUNITY)
Admission: RE | Admit: 2022-06-15 | Discharge: 2022-06-16 | Disposition: A | Discharge status: Home / Self Care | Payer: BC Managed Care – PPO | Attending: Urology | Admitting: Urology

## 2022-06-15 ENCOUNTER — Ambulatory Visit (HOSPITAL_COMMUNITY): Payer: BC Managed Care – PPO | Admitting: Anesthesiology

## 2022-06-15 ENCOUNTER — Encounter (HOSPITAL_COMMUNITY): Payer: Self-pay | Admitting: Urology

## 2022-06-15 ENCOUNTER — Encounter (HOSPITAL_COMMUNITY)
Admission: RE | Disposition: A | Discharge status: Home / Self Care | Payer: Self-pay | Source: Home / Self Care | Attending: Urology

## 2022-06-15 ENCOUNTER — Other Ambulatory Visit: Payer: Self-pay

## 2022-06-15 DIAGNOSIS — Z853 Personal history of malignant neoplasm of breast: Secondary | ICD-10-CM | POA: Diagnosis not present

## 2022-06-15 DIAGNOSIS — J45909 Unspecified asthma, uncomplicated: Secondary | ICD-10-CM | POA: Diagnosis not present

## 2022-06-15 DIAGNOSIS — N2889 Other specified disorders of kidney and ureter: Secondary | ICD-10-CM | POA: Diagnosis present

## 2022-06-15 DIAGNOSIS — Z79899 Other long term (current) drug therapy: Secondary | ICD-10-CM | POA: Diagnosis not present

## 2022-06-15 DIAGNOSIS — I129 Hypertensive chronic kidney disease with stage 1 through stage 4 chronic kidney disease, or unspecified chronic kidney disease: Secondary | ICD-10-CM | POA: Insufficient documentation

## 2022-06-15 DIAGNOSIS — C642 Malignant neoplasm of left kidney, except renal pelvis: Principal | ICD-10-CM | POA: Insufficient documentation

## 2022-06-15 DIAGNOSIS — N189 Chronic kidney disease, unspecified: Secondary | ICD-10-CM | POA: Diagnosis not present

## 2022-06-15 DIAGNOSIS — Z87891 Personal history of nicotine dependence: Secondary | ICD-10-CM | POA: Diagnosis not present

## 2022-06-15 HISTORY — PX: ROBOTIC ASSITED PARTIAL NEPHRECTOMY: SHX6087

## 2022-06-15 LAB — HEMOGLOBIN AND HEMATOCRIT, BLOOD
HCT: 44.1 % (ref 36.0–46.0)
Hemoglobin: 14.1 g/dL (ref 12.0–15.0)

## 2022-06-15 SURGERY — NEPHRECTOMY, PARTIAL, ROBOT-ASSISTED
Anesthesia: General | Site: Flank | Laterality: Left

## 2022-06-15 MED ORDER — ONDANSETRON HCL 4 MG/2ML IJ SOLN: INTRAMUSCULAR | Status: DC | PRN

## 2022-06-15 MED ORDER — ACETAMINOPHEN 500 MG PO TABS
1000.0000 mg | ORAL_TABLET | Freq: Four times a day (QID) | ORAL | Status: AC
  Administered 2022-06-16 (×3): 1000 mg via ORAL
  Filled 2022-06-15 (×3): qty 2

## 2022-06-15 MED ORDER — ONDANSETRON HCL 4 MG/2ML IJ SOLN: 4.0000 mg | Freq: Once | INTRAMUSCULAR | Status: DC | PRN

## 2022-06-15 MED ORDER — FENTANYL CITRATE (PF) 250 MCG/5ML IJ SOLN
INTRAMUSCULAR | Status: AC
  Filled 2022-06-15: qty 5

## 2022-06-15 MED ORDER — ROCURONIUM BROMIDE 10 MG/ML (PF) SYRINGE
PREFILLED_SYRINGE | INTRAVENOUS | Status: AC
Start: 2022-06-15 — End: ?
  Filled 2022-06-15: qty 10

## 2022-06-15 MED ORDER — SODIUM CHLORIDE (PF) 0.9 % IJ SOLN
INTRAMUSCULAR | Status: AC
  Filled 2022-06-15: qty 20

## 2022-06-15 MED ORDER — MIDAZOLAM HCL 2 MG/2ML IJ SOLN
INTRAMUSCULAR | Status: AC
  Filled 2022-06-15: qty 2

## 2022-06-15 MED ORDER — PHENYLEPHRINE HCL-NACL 20-0.9 MG/250ML-% IV SOLN
INTRAVENOUS | Status: AC
  Filled 2022-06-15: qty 250

## 2022-06-15 MED ORDER — LACTATED RINGERS IV SOLN: INTRAVENOUS | Status: DC

## 2022-06-15 MED ORDER — DIPHENHYDRAMINE HCL 12.5 MG/5ML PO ELIX
12.5000 mg | ORAL_SOLUTION | Freq: Four times a day (QID) | ORAL | Status: DC | PRN
  Administered 2022-06-16: 25 mg via ORAL
  Filled 2022-06-15: qty 10

## 2022-06-15 MED ORDER — FENTANYL CITRATE (PF) 250 MCG/5ML IJ SOLN
INTRAMUSCULAR | Status: DC | PRN
  Administered 2022-06-15 (×5): 50 ug via INTRAVENOUS

## 2022-06-15 MED ORDER — ESCITALOPRAM OXALATE 20 MG PO TABS
20.0000 mg | ORAL_TABLET | Freq: Every day | ORAL | Status: DC
Start: 2022-06-16 — End: 2022-06-16
  Administered 2022-06-16: 20 mg via ORAL
  Filled 2022-06-15: qty 1

## 2022-06-15 MED ORDER — SURGIFLO WITH THROMBIN (HEMOSTATIC MATRIX KIT) OPTIME
TOPICAL | Status: DC | PRN
  Administered 2022-06-15: 1 via TOPICAL

## 2022-06-15 MED ORDER — ONDANSETRON HCL 4 MG/2ML IJ SOLN
INTRAMUSCULAR | Status: AC
  Filled 2022-06-15: qty 2

## 2022-06-15 MED ORDER — DEXAMETHASONE SODIUM PHOSPHATE 10 MG/ML IJ SOLN
INTRAMUSCULAR | Status: AC
  Filled 2022-06-15: qty 1

## 2022-06-15 MED ORDER — LIDOCAINE HCL (PF) 2 % IJ SOLN
INTRAMUSCULAR | Status: DC | PRN
  Administered 2022-06-15: 1.5 mg/kg/h via INTRADERMAL

## 2022-06-15 MED ORDER — LIDOCAINE 2% (20 MG/ML) 5 ML SYRINGE
INTRAMUSCULAR | Status: DC | PRN
  Administered 2022-06-15: 80 mg via INTRAVENOUS

## 2022-06-15 MED ORDER — ONDANSETRON HCL 4 MG/2ML IJ SOLN
INTRAMUSCULAR | Status: DC | PRN
  Administered 2022-06-15: 4 mg via INTRAVENOUS

## 2022-06-15 MED ORDER — MIDAZOLAM HCL 2 MG/2ML IJ SOLN
INTRAMUSCULAR | Status: DC | PRN
  Administered 2022-06-15: 2 mg via INTRAVENOUS

## 2022-06-15 MED ORDER — SUGAMMADEX SODIUM 200 MG/2ML IV SOLN
INTRAVENOUS | Status: DC | PRN
  Administered 2022-06-15: 200 mg via INTRAVENOUS

## 2022-06-15 MED ORDER — LACTATED RINGERS IV SOLN: INTRAVENOUS | Status: DC | PRN

## 2022-06-15 MED ORDER — PHENYLEPHRINE HCL-NACL 20-0.9 MG/250ML-% IV SOLN
INTRAVENOUS | Status: DC | PRN
  Administered 2022-06-15: 20 ug/min via INTRAVENOUS

## 2022-06-15 MED ORDER — ONDANSETRON HCL 4 MG/2ML IJ SOLN
4.0000 mg | INTRAMUSCULAR | Status: DC | PRN
  Administered 2022-06-15: 4 mg via INTRAVENOUS
  Filled 2022-06-15: qty 2

## 2022-06-15 MED ORDER — PROPOFOL 10 MG/ML IV BOLUS
INTRAVENOUS | Status: DC | PRN
  Administered 2022-06-15: 160 mg via INTRAVENOUS

## 2022-06-15 MED ORDER — CHLORHEXIDINE GLUCONATE 0.12 % MT SOLN
15.0000 mL | Freq: Once | OROMUCOSAL | Status: AC
  Administered 2022-06-15: 15 mL via OROMUCOSAL

## 2022-06-15 MED ORDER — BUPIVACAINE LIPOSOME 1.3 % IJ SUSP
INTRAMUSCULAR | Status: AC
  Filled 2022-06-15: qty 20

## 2022-06-15 MED ORDER — OXYCODONE HCL 5 MG PO TABS: 5.0000 mg | ORAL_TABLET | Freq: Once | ORAL | Status: DC | PRN

## 2022-06-15 MED ORDER — LIDOCAINE HCL (PF) 2 % IJ SOLN
INTRAMUSCULAR | Status: AC
  Filled 2022-06-15: qty 10

## 2022-06-15 MED ORDER — ALBUTEROL SULFATE (2.5 MG/3ML) 0.083% IN NEBU: 2.5000 mg | INHALATION_SOLUTION | Freq: Four times a day (QID) | RESPIRATORY_TRACT | Status: DC | PRN

## 2022-06-15 MED ORDER — DEXAMETHASONE SODIUM PHOSPHATE 10 MG/ML IJ SOLN
INTRAMUSCULAR | Status: DC | PRN
  Administered 2022-06-15: 10 mg via INTRAVENOUS

## 2022-06-15 MED ORDER — AMISULPRIDE (ANTIEMETIC) 5 MG/2ML IV SOLN: 10.0000 mg | Freq: Once | INTRAVENOUS | Status: DC | PRN

## 2022-06-15 MED ORDER — CEFAZOLIN SODIUM-DEXTROSE 2-4 GM/100ML-% IV SOLN
2.0000 g | Freq: Once | INTRAVENOUS | Status: AC
  Administered 2022-06-15: 2 g via INTRAVENOUS
  Filled 2022-06-15: qty 100

## 2022-06-15 MED ORDER — ACETAMINOPHEN 10 MG/ML IV SOLN: 1000.0000 mg | Freq: Once | INTRAVENOUS | Status: AC | PRN

## 2022-06-15 MED ORDER — POLYETHYLENE GLYCOL 3350 17 G PO PACK: 17.0000 g | PACK | Freq: Every day | ORAL | Status: DC

## 2022-06-15 MED ORDER — SODIUM CHLORIDE (PF) 0.9 % IJ SOLN
INTRAMUSCULAR | Status: DC | PRN
  Administered 2022-06-15 (×2): 10 mL

## 2022-06-15 MED ORDER — HEMOSTATIC AGENTS (NO CHARGE) OPTIME
TOPICAL | Status: DC | PRN
  Administered 2022-06-15: 1 via TOPICAL

## 2022-06-15 MED ORDER — OXYCODONE HCL 5 MG PO TABS
5.0000 mg | ORAL_TABLET | ORAL | Status: DC | PRN
  Administered 2022-06-15 – 2022-06-16 (×4): 5 mg via ORAL
  Filled 2022-06-15 (×4): qty 1

## 2022-06-15 MED ORDER — AMLODIPINE BESYLATE 10 MG PO TABS
10.0000 mg | ORAL_TABLET | Freq: Every day | ORAL | Status: DC
  Administered 2022-06-16: 10 mg via ORAL
  Filled 2022-06-15: qty 1

## 2022-06-15 MED ORDER — OXYCODONE HCL 5 MG/5ML PO SOLN: 5.0000 mg | Freq: Once | ORAL | Status: DC | PRN

## 2022-06-15 MED ORDER — ACETAMINOPHEN 10 MG/ML IV SOLN
INTRAVENOUS | Status: AC
  Administered 2022-06-15: 1000 mg via INTRAVENOUS
  Filled 2022-06-15: qty 100

## 2022-06-15 MED ORDER — ROCURONIUM BROMIDE 10 MG/ML (PF) SYRINGE
PREFILLED_SYRINGE | INTRAVENOUS | Status: AC
  Filled 2022-06-15: qty 10

## 2022-06-15 MED ORDER — FENTANYL CITRATE PF 50 MCG/ML IJ SOSY
25.0000 ug | PREFILLED_SYRINGE | INTRAMUSCULAR | Status: DC | PRN
  Administered 2022-06-15 (×2): 50 ug via INTRAVENOUS

## 2022-06-15 MED ORDER — FENTANYL CITRATE PF 50 MCG/ML IJ SOSY
PREFILLED_SYRINGE | INTRAMUSCULAR | Status: AC
  Administered 2022-06-15: 50 ug via INTRAVENOUS
  Filled 2022-06-15: qty 3

## 2022-06-15 MED ORDER — ALBUTEROL SULFATE HFA 108 (90 BASE) MCG/ACT IN AERS: 2.0000 | INHALATION_SPRAY | Freq: Four times a day (QID) | RESPIRATORY_TRACT | Status: DC | PRN

## 2022-06-15 MED ORDER — SODIUM CHLORIDE 0.45 % IV SOLN: INTRAVENOUS | Status: DC

## 2022-06-15 MED ORDER — PHENYLEPHRINE 80 MCG/ML (10ML) SYRINGE FOR IV PUSH (FOR BLOOD PRESSURE SUPPORT)
PREFILLED_SYRINGE | INTRAVENOUS | Status: AC
Start: 2022-06-15 — End: ?
  Filled 2022-06-15: qty 20

## 2022-06-15 MED ORDER — HYOSCYAMINE SULFATE 0.125 MG SL SUBL: 0.1250 mg | SUBLINGUAL_TABLET | SUBLINGUAL | Status: DC | PRN

## 2022-06-15 MED ORDER — DOCUSATE SODIUM 100 MG PO CAPS
100.0000 mg | ORAL_CAPSULE | Freq: Two times a day (BID) | ORAL | Status: DC
  Administered 2022-06-15 – 2022-06-16 (×2): 100 mg via ORAL
  Filled 2022-06-15 (×2): qty 1

## 2022-06-15 MED ORDER — BUPIVACAINE LIPOSOME 1.3 % IJ SUSP
INTRAMUSCULAR | Status: DC | PRN
  Administered 2022-06-15 (×2): 10 mL

## 2022-06-15 MED ORDER — HYDROMORPHONE HCL 1 MG/ML IJ SOLN
0.5000 mg | INTRAMUSCULAR | Status: DC | PRN
  Administered 2022-06-15 (×2): 0.5 mg via INTRAVENOUS
  Filled 2022-06-15 (×2): qty 1

## 2022-06-15 MED ORDER — STERILE WATER FOR IRRIGATION IR SOLN
Status: DC | PRN
  Administered 2022-06-15: 1000 mL

## 2022-06-15 MED ORDER — ORAL CARE MOUTH RINSE: 15.0000 mL | Freq: Once | OROMUCOSAL | Status: AC

## 2022-06-15 MED ORDER — PANTOPRAZOLE SODIUM 40 MG PO TBEC
40.0000 mg | DELAYED_RELEASE_TABLET | Freq: Every day | ORAL | Status: DC
  Administered 2022-06-16: 40 mg via ORAL
  Filled 2022-06-15: qty 1

## 2022-06-15 MED ORDER — DOCUSATE SODIUM 100 MG PO CAPS: 100.0000 mg | ORAL_CAPSULE | Freq: Two times a day (BID) | ORAL | Status: AC

## 2022-06-15 MED ORDER — HYDROCODONE-ACETAMINOPHEN 5-325 MG PO TABS: 1.0000 | ORAL_TABLET | Freq: Four times a day (QID) | ORAL | 0 refills | Status: AC | PRN

## 2022-06-15 MED ORDER — ROCURONIUM BROMIDE 10 MG/ML (PF) SYRINGE
PREFILLED_SYRINGE | INTRAVENOUS | Status: DC | PRN
  Administered 2022-06-15 (×3): 20 mg via INTRAVENOUS
  Administered 2022-06-15: 70 mg via INTRAVENOUS

## 2022-06-15 MED ORDER — PRAVASTATIN SODIUM 10 MG PO TABS
10.0000 mg | ORAL_TABLET | Freq: Every day | ORAL | Status: DC
  Administered 2022-06-16: 10 mg via ORAL
  Filled 2022-06-15: qty 1

## 2022-06-15 MED ORDER — DIPHENHYDRAMINE HCL 50 MG/ML IJ SOLN: 12.5000 mg | Freq: Four times a day (QID) | INTRAMUSCULAR | Status: DC | PRN

## 2022-06-15 SURGICAL SUPPLY — 87 items
ADH SKN CLS APL DERMABOND .7 (GAUZE/BANDAGES/DRESSINGS) ×1
AGENT HMST KT MTR STRL THRMB (HEMOSTASIS) ×1
APL ESCP 34 STRL LF DISP (HEMOSTASIS) ×1
APL LAPSCP 35 DL APL RGD (MISCELLANEOUS)
APL PRP STRL LF DISP 70% ISPRP (MISCELLANEOUS) ×1
APPLICATOR SURGIFLO ENDO (HEMOSTASIS) ×1 IMPLANT
APPLICATOR VISTASEAL 35 (MISCELLANEOUS) ×1 IMPLANT
BAG COUNTER SPONGE SURGICOUNT (BAG) ×1 IMPLANT
BAG LAPAROSCOPIC 12 15 PORT 16 (BASKET) IMPLANT
BAG RETRIEVAL 12/15 (BASKET) ×2
BAG SPNG CNTER NS LX DISP (BAG) ×1
CHLORAPREP W/TINT 26 (MISCELLANEOUS) ×2 IMPLANT
CLIP LIGATING HEM O LOK PURPLE (MISCELLANEOUS) ×2 IMPLANT
CLIP LIGATING HEMO LOK XL GOLD (MISCELLANEOUS) IMPLANT
CLIP LIGATING HEMO O LOK GREEN (MISCELLANEOUS) ×5 IMPLANT
CLIP SUT LAPRA TY ABSORB (SUTURE) ×5 IMPLANT
COVER SURGICAL LIGHT HANDLE (MISCELLANEOUS) ×2 IMPLANT
COVER TIP SHEARS 8 DVNC (MISCELLANEOUS) ×1 IMPLANT
COVER TIP SHEARS 8MM DA VINCI (MISCELLANEOUS) ×2
CUTTER ECHEON FLEX ENDO 45 340 (ENDOMECHANICALS) ×1 IMPLANT
DERMABOND ADVANCED (GAUZE/BANDAGES/DRESSINGS) ×1
DERMABOND ADVANCED .7 DNX12 (GAUZE/BANDAGES/DRESSINGS) ×1 IMPLANT
DRAIN CHANNEL 15F RND FF 3/16 (WOUND CARE) IMPLANT
DRAPE ARM DVNC X/XI (DISPOSABLE) ×4 IMPLANT
DRAPE COLUMN DVNC XI (DISPOSABLE) ×1 IMPLANT
DRAPE DA VINCI XI ARM (DISPOSABLE) ×8
DRAPE DA VINCI XI COLUMN (DISPOSABLE) ×2
DRAPE INCISE IOBAN 66X45 STRL (DRAPES) ×2 IMPLANT
DRAPE SHEET LG 3/4 BI-LAMINATE (DRAPES) ×2 IMPLANT
ELECT PENCIL ROCKER SW 15FT (MISCELLANEOUS) ×2 IMPLANT
ELECT REM PT RETURN 15FT ADLT (MISCELLANEOUS) ×2 IMPLANT
EVACUATOR SILICONE 100CC (DRAIN) IMPLANT
GLOVE BIO SURGEON STRL SZ 6.5 (GLOVE) ×2 IMPLANT
GLOVE BIOGEL PI IND STRL 7.5 (GLOVE) ×2 IMPLANT
GLOVE BIOGEL PI INDICATOR 7.5 (GLOVE) ×2
GLOVE SURG LX 7.5 STRW (GLOVE) ×2
GLOVE SURG LX STRL 7.5 STRW (GLOVE) ×2 IMPLANT
GOWN SRG XL LVL 4 BRTHBL STRL (GOWNS) ×1 IMPLANT
GOWN STRL NON-REIN XL LVL4 (GOWNS) ×2
GOWN STRL REUS W/ TWL LRG LVL3 (GOWN DISPOSABLE) ×2 IMPLANT
GOWN STRL REUS W/TWL LRG LVL3 (GOWN DISPOSABLE) ×4
HEMOSTAT SURGICEL 4X8 (HEMOSTASIS) ×2 IMPLANT
HOLDER FOLEY CATH W/STRAP (MISCELLANEOUS) ×2 IMPLANT
IRRIG SUCT STRYKERFLOW 2 WTIP (MISCELLANEOUS) ×2
IRRIGATION SUCT STRKRFLW 2 WTP (MISCELLANEOUS) ×1 IMPLANT
KIT BASIN OR (CUSTOM PROCEDURE TRAY) ×2 IMPLANT
KIT TURNOVER KIT A (KITS) IMPLANT
LOOP VESSEL MAXI BLUE (MISCELLANEOUS) ×2 IMPLANT
MARKER SKIN DUAL TIP RULER LAB (MISCELLANEOUS) ×2 IMPLANT
NDL INSUFFLATION 14GA 120MM (NEEDLE) ×1 IMPLANT
NEEDLE INSUFFLATION 14GA 120MM (NEEDLE) ×2 IMPLANT
PAD POSITIONING PINK XL (MISCELLANEOUS) ×1 IMPLANT
PATTIES SURGICAL .5 X3 (DISPOSABLE) ×1 IMPLANT
PROTECTOR NERVE ULNAR (MISCELLANEOUS) ×4 IMPLANT
RELOAD STAPLE 45 2.6 WHT THIN (STAPLE) IMPLANT
SEAL CANN UNIV 5-8 DVNC XI (MISCELLANEOUS) ×4 IMPLANT
SEAL XI 5MM-8MM UNIVERSAL (MISCELLANEOUS) ×8
SEALER VESSEL DA VINCI XI (MISCELLANEOUS) ×2
SEALER VESSEL EXT DVNC XI (MISCELLANEOUS) IMPLANT
SET TUBE SMOKE EVAC HIGH FLOW (TUBING) ×2 IMPLANT
SOLUTION ELECTROLUBE (MISCELLANEOUS) ×2 IMPLANT
SPIKE FLUID TRANSFER (MISCELLANEOUS) ×2 IMPLANT
STAPLE RELOAD 45 WHT (STAPLE) ×2 IMPLANT
STAPLE RELOAD 45MM WHITE (STAPLE) ×4
SURGIFLO W/THROMBIN 8M KIT (HEMOSTASIS) ×1 IMPLANT
SUT ETHILON 3 0 PS 1 (SUTURE) ×2 IMPLANT
SUT MNCRL AB 4-0 PS2 18 (SUTURE) ×4 IMPLANT
SUT PDS AB 0 CT1 36 (SUTURE) ×2 IMPLANT
SUT PROLENE 4 0 RB 1 (SUTURE) ×2
SUT PROLENE 4-0 RB1 .5 CRCL 36 (SUTURE) ×1 IMPLANT
SUT VIC AB 2-0 CT1 27 (SUTURE)
SUT VIC AB 2-0 CT1 27XBRD (SUTURE) IMPLANT
SUT VIC AB 2-0 SH 27 (SUTURE) ×16
SUT VIC AB 2-0 SH 27X BRD (SUTURE) IMPLANT
SUT VIC AB 2-0 SH 27XBRD (SUTURE) ×6 IMPLANT
SUT VICRYL 0 UR6 27IN ABS (SUTURE) ×3 IMPLANT
SUT VLOC BARB 180 ABS3/0GR12 (SUTURE) ×4
SUTURE VLOC BRB 180 ABS3/0GR12 (SUTURE) ×2 IMPLANT
SYS BAG RETRIEVAL 10MM (BASKET) ×2
SYSTEM BAG RETRIEVAL 10MM (BASKET) ×1 IMPLANT
TOWEL OR 17X26 10 PK STRL BLUE (TOWEL DISPOSABLE) ×2 IMPLANT
TOWEL OR NON WOVEN STRL DISP B (DISPOSABLE) ×2 IMPLANT
TRAY FOLEY MTR SLVR 16FR STAT (SET/KITS/TRAYS/PACK) ×2 IMPLANT
TRAY LAPAROSCOPIC (CUSTOM PROCEDURE TRAY) ×2 IMPLANT
TROCAR Z THREAD OPTICAL 12X100 (TROCAR) IMPLANT
TROCAR Z-THREAD OPTICAL 5X100M (TROCAR) IMPLANT
WATER STERILE IRR 1000ML POUR (IV SOLUTION) ×2 IMPLANT

## 2022-06-15 NOTE — Anesthesia Preprocedure Evaluation (Signed)
Anesthesia Evaluation  Patient identified by MRN, date of birth, ID band Patient awake    Reviewed: Allergy & Precautions, NPO status , Patient's Chart, lab work & pertinent test results  History of Anesthesia Complications Negative for: history of anesthetic complications  Airway Mallampati: II  TM Distance: >3 FB Neck ROM: Full    Dental  (+) Dental Advisory Given, Teeth Intact   Pulmonary asthma , former smoker,    Pulmonary exam normal        Cardiovascular hypertension, Pt. on medications + Peripheral Vascular Disease  Normal cardiovascular exam     Neuro/Psych Anxiety Depression negative neurological ROS     GI/Hepatic Neg liver ROS, PUD, GERD  ,  Endo/Other  negative endocrine ROS  Renal/GU Renal disease (L renal mass)  negative genitourinary   Musculoskeletal negative musculoskeletal ROS (+)   Abdominal   Peds  Hematology negative hematology ROS (+)   Anesthesia Other Findings   Reproductive/Obstetrics                           Anesthesia Physical Anesthesia Plan  ASA: 3  Anesthesia Plan: General   Post-op Pain Management: Tylenol PO (pre-op)* and Lidocaine infusion*   Induction: Intravenous  PONV Risk Score and Plan: 4 or greater and Ondansetron, Dexamethasone, Treatment may vary due to age or medical condition and Midazolam  Airway Management Planned: Oral ETT  Additional Equipment: None  Intra-op Plan:   Post-operative Plan: Extubation in OR  Informed Consent: I have reviewed the patients History and Physical, chart, labs and discussed the procedure including the risks, benefits and alternatives for the proposed anesthesia with the patient or authorized representative who has indicated his/her understanding and acceptance.     Dental advisory given  Plan Discussed with:   Anesthesia Plan Comments:         Anesthesia Quick Evaluation

## 2022-06-15 NOTE — Op Note (Signed)
Operative Note  Preoperative diagnosis:  1.  Left renal mass  Postoperative diagnosis: 1.  Left renal mass  Procedure(s): 1.  Robotic assisted laparoscopic left radical nephrectomy (adrenal sparing) 2. Intraoperative ultrasound of single retroperitoneal organ  Surgeon: Rexene Alberts, MD  Resident: Hinton Rao, PGY-4  Assistants:  Debbrah Alar, PA  An assistant was required for this surgical procedure.  The duties of the assistant included but were not limited to suctioning, passing suture, camera manipulation, retraction.  This procedure would not be able to be performed without an Environmental consultant.   Anesthesia:  General  Complications:  None  EBL:  45m  Specimens: ID Type Source Tests Collected by Time Destination  1 : Left kidney Tissue PATH Other SURGICAL PATHOLOGY GJanith Lima MD 06/15/2022 1602    Drains/Catheters: 1.  None  Intraoperative findings:   Large left upper pole renal mass extending into renal sinus and abutting the collecting system. 1 left renal artery. 2 left renal veins. Persistent hematoma over anterior surface of the kidney fusing perinephric fat and capsule.   Indication:  Tracy Perez a 56year old female with a left renal mass presenting for a robotic assisted laparoscopic left partial vs left radical nephrectomy. She initially presented with a spontaneous hemorrhage with large left perinephric hematoma. The hematoma resolved on subsequent imaging.  Description of procedure: The indications, alternatives, benefits and risks were discussed with the patient and informed consent was obtained.  The patient was brought onto the operating room table, positioned supine and secured to the bed with a safety strap.  All pressure points were carefully padded and pneumatic compression devices were placed on the lower extremities.  After the administration of intravenous antibiotics and general endotracheal anesthesia, a 16 French urethral catheter was  inserted to drain the bladder.  The patient was repositioned in the right lateral decubitus with the left side elevated at a 70 degree angle with the right lower leg flexed and the left upper leg extended.  An axillary roll was positioned to protect the brachial plexus and a gel pad was placed to support the back.  Multiple pillows were used to pad beneath and between the lower extremities and to ensure adequate cushioning. The left arm was placed in an armrest and carefully padded. The patient was secured in place across the hips, chest and legs with foam padding and silk tape, and the table was flexed.  The patient was prepped and draped in the standard sterile manner.  The radiographic images were in the room.  Timeout was completed verifying the correct patient, surgical procedure, site and positioning, prior to beginning the procedure.  I performed an open trocar placement at the location of the camera trocar. I made a 144mincision, dissected through the fat and identified the fascia. This was lifted up and cut sharply with Metzenbaum scissors. Next, a 1219mrocar was placed into the abdomen and pneumoperitoneum was introduced. The camera trocar was placed approximately 7-1/2 cm inferior and 2 cm medially to the planned position of the left robotic arm which was approximately 2 fingerbreadths inferior to the costal margin.  The 0 degree lens was inserted under direct visualization.  The abdominal cavity was examined for any signs of injury, adhesions and identification of anatomic landmarks. We then placed our right robotic trocar, left robotic trocar and fourth arm trocar.  A 12 mm assistant port was placed supraumbilically. The robot was then docked.  The white line of Toldt was incised and the colon  was reflected medially, exposing Gerota's fascia. Of note, this dissection was difficult as her planes were fused from a prior perirenal hematoma. The splenorenal nad splenophrenic ligaments were incised to  mobilize the spleen. The tail of the pancreas was mobilized medially. The retroperitoneal fascia overlying the renal vessels was carefully separated, exposing the underlying renal vein.  I was able to achieve an adequate lift using the fourth arm to elevate the kidney, further exposing the hilum.  The left renal vein and left renal artery were carefully dissected and mobilized. Of note, she had 2 left renal veins and one left renal artery. A vessel loop was placed around the left renal artery. Next, I opened gerotas fascia over the anterior surface of the kidney and encountered old perinephric hematoma. This plane was very friable with sticky fat. I opened Gerota's in several areas and was unable to achieve a good plan on the capsule. The capsule was fused to the old blood products and the perirenal fat. An intraoperative ultrasound was placed and I identified the large left upper pole renal mass extending deep into the kidney and abutting the collecting system. As I was unable to access a good plane that was safely accommodate a partial nephrectomy and subsequent renorrhaphy  given her prior perinephric hematoma, I elected to proceed with a left radical nephrectomy.  A laparoscopic battery-operated stapler with a vascular load was used to control and ligate the left renal artery.  A separate vascular load was used to secure the left renal vein.  Next, the vessel sealer was used to mobilize the upper pole and any remaining attachments to the left kidney. Special attention was given to the cranial connections to the adrenal gland, which were carefully divided using the vessel sealer, completely freeing the adrenal off the superior pole of the kidney.  The left ureter was divided between clips.   The kidney was placed in an Endo Catch bag.  The renal fossa and remainder of the operating field were inspected for bleeding or injury.  The insufflation pressure was reduced, again confirming the absence of bleeding.   Excellent hemostasis was obtained.  A mini-Gibson incision was made at the site of the fourth arm trocar.  The fascia was opened and the specimen was removed in a muscle-sparing fashion.  The ports were removed under direct vision.  Carter-Thomason device was used to close the 12 mm port.  All wounds were copiously irrigated and then infiltrated with Exparel.  The muscle was reapproximated using 0 Vicryl as a first layer. 1-0 PDS was used to close the fascia as a second layer.  The skin incisions were reapproximated with 4-0 Monocryl.  Dermabond was then applied on the skin.  At the end of the procedure, all counts were correct.  The patient tolerated the procedure well and was taken to the recovery room in satisfactory condition.  Matt R[facility]  Pager: [phone]

## 2022-06-15 NOTE — Anesthesia Procedure Notes (Signed)
Procedure Name: Intubation Date/Time: 06/15/2022 1:30 PM  Performed by: Genelle Bal, CRNAPre-anesthesia Checklist: Patient identified, Emergency Drugs available, Suction available and Patient being monitored Patient Re-evaluated:Patient Re-evaluated prior to induction Oxygen Delivery Method: Circle system utilized Preoxygenation: Pre-oxygenation with 100% oxygen Induction Type: IV induction Ventilation: Mask ventilation without difficulty and Oral airway inserted - appropriate to patient size Laryngoscope Size: Mac and 4 Grade View: Grade I Tube type: Oral Tube size: 7.0 mm Number of attempts: 1 Airway Equipment and Method: Stylet and Oral airway Placement Confirmation: ETT inserted through vocal cords under direct vision, positive ETCO2 and breath sounds checked- equal and bilateral Secured at: 21 cm Tube secured with: Tape Dental Injury: Teeth and Oropharynx as per pre-operative assessment  Comments: Intubation by Paramedic student

## 2022-06-15 NOTE — Transfer of Care (Signed)
Immediate Anesthesia Transfer of Care Note  Patient: Tracy Perez  Procedure(s) Performed: XI ROBOTIC ASSITEDLAPAROSCOPIC RADICAL NEPHRECTOMY (Left: Flank)  Patient Location: PACU  Anesthesia Type:General  Level of Consciousness: drowsy  Airway & Oxygen Therapy: Patient Spontanous Breathing and Patient connected to face mask oxygen  Post-op Assessment: Report given to RN and Post -op Vital signs reviewed and stable  Post vital signs: Reviewed and stable  Last Vitals:  Vitals Value Taken Time  BP 157/102 06/15/22 1730  Temp 36.4 C 06/15/22 1730  Pulse 86 06/15/22 1730  Resp 20 06/15/22 1730  SpO2      Last Pain:  Vitals:   06/15/22 1049  TempSrc:   PainSc: 0-No pain         Complications: No notable events documented.

## 2022-06-15 NOTE — Anesthesia Postprocedure Evaluation (Signed)
Anesthesia Post Note  Patient: Tracy Perez  Procedure(s) Performed: XI ROBOTIC ASSITEDLAPAROSCOPIC RADICAL NEPHRECTOMY (Left: Flank)     Patient location during evaluation: PACU Anesthesia Type: General Level of consciousness: awake and alert Pain management: pain level controlled Vital Signs Assessment: post-procedure vital signs reviewed and stable Respiratory status: spontaneous breathing, nonlabored ventilation, respiratory function stable and patient connected to nasal cannula oxygen Cardiovascular status: blood pressure returned to baseline and stable Postop Assessment: no apparent nausea or vomiting Anesthetic complications: no   No notable events documented.  Last Vitals:  Vitals:   06/15/22 1800 06/15/22 1815  BP: (!) 144/92 121/90  Pulse: 83 82  Resp: 13 19  Temp:    SpO2: 96% 97%    Last Pain:  Vitals:   06/15/22 1815  TempSrc:   PainSc: 9                  Momodou Consiglio E Kendryck Lacroix

## 2022-06-15 NOTE — Discharge Instructions (Signed)

## 2022-06-15 NOTE — H&P (Signed)
Office Visit Report     06/09/2022   --------------------------------------------------------------------------------   Tracy Perez  MRN: [id number]  DOB: March 07, 1966, 56 year old Female  SSN:    PRIMARY CARE:  Berkley Harvey, NP  REFERRING:    PROVIDER:  Rexene Alberts, M.D.  TREATING:  [place], [place]  LOCATION:  [facility] [phone]     --------------------------------------------------------------------------------   CC/HPI: Pt presents today for pre-operative history and physical exam in anticipation of robotic assisted lap left partial vs radical nephrectomy by Dr. Abner Greenspan on 06/15/22. She is doing well and is without complaint.   Pt denies F/C, HA, CP, SOB, N/V, diarrhea/constipation, back pain, flank pain, hematuria, and dysuria.    HX:   Tracy Perez is a 56 year old female who is seen in consultation for a left renal mass with concomitant subcapsular perinephric hematoma.   #1. Left renal mass:  -She presented the ED on 02/05/2022 with complaints of left-sided flank pain and vomiting for 1 day. She denies any gross hematuria. She had no trauma. She had no prior known history of urologic malignancy.  -CT A/P noncontrast 02/05/2022 incidentally revealed a left upper pole renal mass measuring 3.2 cm on a noncontrasted study along with a subcapsular left perinephric hematoma measuring 2 cm.  -She had a slight drop in her hemoglobin to 11 during admission. She also developed a fever of 101.1. Repeat CT demonstrated no enlargement and hematoma and no concern for abscess. She was treated with 2 weeks of antibiotics.  -Subsequent MRI 03/31/2022 demonstrated exophytic complex left renal lesion measuring 3.7 x 3.6 cm most consistent with a renal cell carcinoma. There is near complete resolution of the previously identified left perinephric hematoma. There is no evidence of tumor in the renal vein or abdominal metastatic disease.   -She denies any  abdominal pain or flank pain. She denies gross hematuria.   She has a past medical history of hypertension, hyperlipidemia, PVD, asthma, GERD. She has had 2 C-sections, appendectomy, and breast lumpectomy.     ALLERGIES: None    Notes: contrast dye nausea and vomiting      MEDICATIONS: Albuterol Sulfate  Amlodipine Besylate 10 mg tablet  Chlorthalidone 25 mg tablet  Escitalopram Oxalate 20 mg tablet  Losartan Potassium 100 mg tablet  Pantoprazole Sodium 40 mg tablet, delayed release  Potassium  Pravastatin Sodium 10 mg tablet  Tramadol Hcl  Vitamin D3     GU PSH: None     PSH Notes: C-section x2  left LE revascularization (pt unable to describe but states no bypass or stents)      NON-GU PSH: Appendectomy Breast lumpectomy     GU PMH: Left renal neoplasm - 04/07/2022, - 03/09/2022      PMH Notes: hyperlipidemia, PVD, asthma  hypokalemia  pre-diabetic     NON-GU PMH: Anxiety Breast Cancer, History Depression GERD Hypertension    FAMILY HISTORY: Hypertension - Runs in Family   SOCIAL HISTORY: Marital Status: Divorced Ethnicity: Not Hispanic Or Latino; Race: Black or African American Current Smoking Status: Patient does not smoke anymore. Has not smoked since 02/21/1990. Smoked for 8 years.   Tobacco Use Assessment Completed: Used Tobacco in last 30 days? Does not use smokeless tobacco. Does drink.  Does not use drugs. Drinks 2 caffeinated drinks per day. Has not had a blood transfusion.     Notes: ETOH wine, liquor glass per day    REVIEW OF SYSTEMS:    GU Review Female:   Patient  denies frequent urination, hard to postpone urination, burning /pain with urination, get up at night to urinate, leakage of urine, stream starts and stops, trouble starting your stream, have to strain to urinate, and being pregnant.  Gastrointestinal (Upper):   Patient denies nausea, vomiting, and indigestion/ heartburn.  Gastrointestinal (Lower):   Patient denies diarrhea and  constipation.  Constitutional:   Patient denies night sweats, fatigue, fever, and weight loss.  Skin:   Patient denies skin rash/ lesion and itching.  Eyes:   Patient denies blurred vision and double vision.  Ears/ Nose/ Throat:   Patient denies sore throat and sinus problems.  Hematologic/Lymphatic:   Patient denies swollen glands and easy bruising.  Cardiovascular:   Patient denies leg swelling and chest pains.  Respiratory:   Patient denies cough and shortness of breath.  Endocrine:   Patient denies excessive thirst.  Musculoskeletal:   Patient denies back pain and joint pain.  Neurological:   Patient denies headaches and dizziness.  Psychologic:   Patient denies depression and anxiety.   VITAL SIGNS:      06/09/2022 03:02 PM  BP 139/71 mmHg  Pulse 80 /min  Temperature 97.8 F / 36.5 C   MULTI-SYSTEM PHYSICAL EXAMINATION:    Constitutional: Well-nourished. No physical deformities. Normally developed. Good grooming.  Neck: Neck symmetrical, not swollen. Normal tracheal position.  Respiratory: Normal breath sounds. No labored breathing, no use of accessory muscles.   Cardiovascular: Regular rate and rhythm. No murmur, no gallop.   Lymphatic: No enlargement of neck, axillae, groin.  Skin: No paleness, no jaundice, no cyanosis. No lesion, no ulcer, no rash.  Neurologic / Psychiatric: Oriented to time, oriented to place, oriented to person. No depression, no anxiety, no agitation.  Gastrointestinal: No mass, no tenderness, no rigidity, obese abdomen.   Eyes: Normal conjunctivae. Normal eyelids.  Ears, Nose, Mouth, and Throat: Left ear no scars, no lesions, no masses. Right ear no scars, no lesions, no masses. Nose no scars, no lesions, no masses. Normal hearing. Normal lips.  Musculoskeletal: Normal gait and station of head and neck.     Complexity of Data:  Records Review:   Previous Patient Records  Urine Test Review:   Urinalysis   06/09/22  Urinalysis  Urine Appearance Slightly  Cloudy   Urine Color Yellow   Urine Glucose Neg mg/dL  Urine Bilirubin Neg mg/dL  Urine Ketones Neg mg/dL  Urine Specific Gravity 1.015   Urine Blood Neg ery/uL  Urine pH 7.5   Urine Protein Neg mg/dL  Urine Urobilinogen 0.2 mg/dL  Urine Nitrites Neg   Urine Leukocyte Esterase Neg leu/uL  Urine WBC/hpf 0 - 5/hpf   Urine RBC/hpf NS (Not Seen)   Urine Epithelial Cells 6 - 10/hpf   Urine Bacteria Mod (26-50/hpf)   Urine Mucous Not Present   Urine Yeast NS (Not Seen)   Urine Trichomonas Not Present   Urine Cystals NS (Not Seen)   Urine Casts NS (Not Seen)   Urine Sperm Not Present    PROCEDURES:          Urinalysis w/Scope - 81001 Dipstick Dipstick Cont'd Micro  Color: Yellow Bilirubin: Neg mg/dL WBC/hpf: 0 - 5/hpf  Appearance: Slightly Cloudy Ketones: Neg mg/dL RBC/hpf: NS (Not Seen)  Specific Gravity: 1.015 Blood: Neg ery/uL Bacteria: Mod (26-50/hpf)  pH: 7.5 Protein: Neg mg/dL Cystals: NS (Not Seen)  Glucose: Neg mg/dL Urobilinogen: 0.2 mg/dL Casts: NS (Not Seen)    Nitrites: Neg Trichomonas: Not Present    Leukocyte Esterase: Neg  leu/uL Mucous: Not Present      Epithelial Cells: 6 - 10/hpf      Yeast: NS (Not Seen)      Sperm: Not Present    ASSESSMENT:      ICD-10 Details  1 GU:   Left renal neoplasm - D49.512    PLAN:           Orders Labs Urine Culture          Schedule Return Visit/Planned Activity: Keep Scheduled Appointment - Schedule Surgery          Document Letter(s):  Created for Patient: Clinical Summary         Notes:   There are no changes in the patients history or physical exam since last evaluation by Dr. Abner Greenspan. Pt is scheduled to undergo RAL left partial vs radical nephrectomy on 06/15/22.   Bacteria noted on UA. She is asymptomatic. Check culture and treat if necessary.   All pt's questions were answered to the best of my ability.          Next Appointment:      Next Appointment: 06/15/2022 12:15 PM    Appointment Type: Surgery      Location: [facility] [phone]    Provider: Rexene Alberts, M.D.    Reason for Visit: WL/OP LT RA LAP PARTIAL NX , POSS RAD NX WITH [facility]    Urology Preoperative H&P   Chief Complaint: Left renal mass  History of Present Illness: Tracy Perez is a 56 y.o. female with a left renal mass here for left robotic assisted laparoscopic partial nephrectomy vs radical nephrectomy. Denies fevers, chills, dysuria.    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, Macrocalcifications identified / ER 100%, PR 85%   Chronic kidney disease    Colon polyps    Depression    Fibroids    GERD (gastroesophageal reflux disease)    H pylori ulcer 07/21/2021   H/O Doppler ultrasound 02/17/2016   left lower leg and right upper leg   HLD (hyperlipidemia)    Hypertension    Peripheral vascular disease (HCC)    S/P radiation therapy 03/06/2013-04/20/2013   Left Breast  /50.4Gy in 28 fractions/ Left Breast Boost / 10 Gy in 5 fractions   Use of tamoxifen (Nolvadex)     Past Surgical History:  Procedure Laterality Date   APPENDECTOMY     CERVICAL CONE BIOPSY  1987   CESAREAN SECTION      x 2   COLONOSCOPY     06-07 was normal in [place]   [place] N/A 02/18/2016   Procedure: DILATATION & CURETTAGE/HYSTEROSCOPY WITH MYOSURE;  Surgeon: Servando Salina, MD;  Location: [facility];  Service: Gynecology;  Laterality: N/A;   LEG SURGERY Left 1994   vascular   PARTIAL MASTECTOMY WITH NEEDLE LOCALIZATION Left 01/16/2013   Procedure: PARTIAL MASTECTOMY WITH NEEDLE LOCALIZATION;  Surgeon: Adin Hector, MD;  Location: [facility];  Service: General;  Laterality: Left;  Left partial mastectomy with needle localization at breast center of [place]    UPPER GASTROINTESTINAL ENDOSCOPY       Allergies:  Allergies  Allergen Reactions   Ibuprofen Other (See Comments)    Was told by provider not to take Ibuprofen  Iodinated Contrast Media Nausea And Vomiting        Prednisolone Other (See Comments)    headache    Gadobutrol Nausea And Vomiting    MRI dye   Gadolinium Derivatives Nausea And Vomiting    Nausea and vomiting immediately following injection.  MRI Dye    Gadoversetamide Nausea And Vomiting    Nausea and vomiting immediately following injection.  MRI Dye     Family History  Problem Relation Age of Onset   Asthma Mother    Allergies Mother    Hypertension Mother    Anuerysm Mother        brain   Hyperlipidemia Mother    Cancer Father        type unknown   Colon polyps Brother    Colon polyps Maternal Aunt    Breast cancer Maternal Aunt    Arthritis Maternal Aunt    Bipolar disorder Maternal Aunt    Depression Maternal Aunt    Arthritis Maternal Aunt    Allergies Daughter    Hypertension Daughter    Hypertension Son    Colon cancer Neg Hx    Rectal cancer Neg Hx    Stomach cancer Neg Hx    Esophageal cancer Neg Hx     Social History:  reports that she quit smoking about 32 years ago. Her smoking use included cigarettes. She has a 4.50 pack-year smoking history. She has never used smokeless tobacco. She reports current alcohol use. She reports that she does not use drugs.  ROS: A complete review of systems was performed.  All systems are negative except for pertinent findings as noted.  Physical Exam:  Vital signs in last 24 hours:   Constitutional:  Alert and oriented, No acute distress Cardiovascular: Regular rate and rhythm Respiratory: Normal respiratory effort, Lungs clear bilaterally GI: Abdomen is soft, nontender, nondistended, no abdominal masses GU: No CVA tenderness Lymphatic: No lymphadenopathy Neurologic: Grossly intact, no focal deficits Psychiatric: Normal mood and affect  Laboratory Data:  No results for input(s):  "WBC", "HGB", "HCT", "PLT" in the last 72 hours.  No results for input(s): "NA", "K", "CL", "GLUCOSE", "BUN", "CALCIUM", "CREATININE" in the last 72 hours.  Invalid input(s): "CO3"   No results found for this or any previous visit (from the past 24 hour(s)). No results found for this or any previous visit (from the past 240 hour(s)).  Renal Function: No results for input(s): "CREATININE" in the last 168 hours. CrCl cannot be calculated (Patient's most recent lab result is older than the maximum 21 days allowed.).  Radiologic Imaging: No results found.  I independently reviewed the above imaging studies.  Assessment and Plan LILIANA DANG is a 56 y.o. female with  left renal mass here for left robotic assisted laparoscopic partial nephrectomy vs radical nephrectomy.   We have discussed the risks of treatment in detail including but not limited to bleeding, infection, heart attack, stroke, death, venothromoboembolism, cancer recurrence, injury/damage to surrounding organs and structures, urine leak, the possibility of open surgical conversion for patients undergoing minimally invasive surgery, the risk of developing chronic kidney disease and its associated implications, and the potential risk of end stage renal disease possibly necessitating dialysis.   Matt R. Durrel Mcnee MD 06/15/2022, 9:36 AM  [facility] Pager: [phone]): [phone]

## 2022-06-16 ENCOUNTER — Encounter (HOSPITAL_COMMUNITY): Payer: Self-pay | Admitting: Urology

## 2022-06-16 DIAGNOSIS — C642 Malignant neoplasm of left kidney, except renal pelvis: Secondary | ICD-10-CM | POA: Diagnosis not present

## 2022-06-16 LAB — BASIC METABOLIC PANEL
Anion gap: 12 (ref 5–15)
BUN: 12 mg/dL (ref 6–20)
CO2: 25 mmol/L (ref 22–32)
Calcium: 9.1 mg/dL (ref 8.9–10.3)
Chloride: 101 mmol/L (ref 98–111)
Creatinine, Ser: 1.15 mg/dL — ABNORMAL HIGH (ref 0.44–1.00)
GFR, Estimated: 56 mL/min — ABNORMAL LOW (ref >60)
Glucose, Bld: 121 mg/dL — ABNORMAL HIGH (ref 70–99)
Potassium: 4 mmol/L (ref 3.5–5.1)
Sodium: 138 mmol/L (ref 135–145)

## 2022-06-16 LAB — HEMOGLOBIN AND HEMATOCRIT, BLOOD
HCT: 40.5 % (ref 36.0–46.0)
Hemoglobin: 12.9 g/dL (ref 12.0–15.0)

## 2022-06-16 NOTE — Progress Notes (Signed)
Patient ambulated twice during the nightshift and tolerate well. Patient did have one episode of nausea and one episode of itching. Gave PRNs for both.

## 2022-06-16 NOTE — Plan of Care (Signed)
  Problem: Activity: Goal: Risk for activity intolerance will decrease Outcome: Adequate for Discharge   Problem: Elimination: Goal: Will not experience complications related to bowel motility Outcome: Adequate for Discharge   Problem: Pain Managment: Goal: General experience of comfort will improve Outcome: Adequate for Discharge

## 2022-06-16 NOTE — TOC Transition Note (Signed)
Transition of Care [facility]) - CM/SW Discharge Note   Patient Details  Name: LEYAH BOCCHINO MRN: [id number] Date of Birth: 29-Dec-1965  [facility]) CM/SW Contact:  Leeroy Cha, RN Phone Number: 06/16/2022, 2:15 PM   Clinical Narrative:    Dcd to home with no toc needs   Final next level of care: Home/Self Care Barriers to Discharge: Barriers Resolved   Patient Goals and CMS Choice Patient states their goals for this hospitalization and ongoing recovery are:: to go home CMS Medicare.gov Compare Post Acute Care list provided to:: Patient Choice offered to / list presented to : Patient  Discharge Placement                       Discharge Plan and Services   Discharge Planning Services: CM Consult                                 Social Determinants of Health (SDOH) Interventions     Readmission Risk Interventions     No data to display

## 2022-06-16 NOTE — Plan of Care (Signed)

## 2022-06-16 NOTE — Discharge Summary (Signed)
Date of admission: 06/15/2022  Date of discharge: 06/16/2022  Admission diagnosis: Left renal mass  Discharge diagnosis: Left renal mass  Secondary diagnoses: Left renal mass  History and Physical: For full details, please see admission history and physical. Briefly, Tracy Perez is a 56 y.o. year old patient with a left renal mass. She underwent a left radical nephrectomy, robotic assisted on 06/15/2022.   Hospital Course: The patient recovered in the usual expected fashion.  She had her diet advanced slowly.  Initially managed with IV pain control, then transitioned to PO meds when she was tolerating oral intake.  Her labs were stable throughout the hospital course.  She was discharged to home on POD#1.  At the time of discharge she was tolerating a regular diet, passing flatus, ambulating, had adequate pain control and was agreeable to discharge.  Follow up as scheduled.    Laboratory values:  Recent Labs    06/15/22 1740 06/16/22 0350  HGB 14.1 12.9  HCT 44.1 40.5   Recent Labs    06/16/22 0350  CREATININE 1.15*    Disposition: Home  Discharge instruction: The patient was instructed to be ambulatory but told to refrain from heavy lifting, strenuous activity, or driving.  Discharge medications:  Allergies as of 06/16/2022       Reactions   Ibuprofen Other (See Comments)   Was told by provider not to take Ibuprofen   Iodinated Contrast Media Nausea And Vomiting      Prednisolone Other (See Comments)   headache   Gadobutrol Nausea And Vomiting   MRI dye   Gadolinium Derivatives Nausea And Vomiting   Nausea and vomiting immediately following injection.  MRI Dye    Gadoversetamide Nausea And Vomiting   Nausea and vomiting immediately following injection.  MRI Dye         Medication List     TAKE these medications    albuterol 108 (90 Base) MCG/ACT inhaler Commonly known as: VENTOLIN HFA Inhale 2 puffs into the lungs every 6 (six) hours as needed for  shortness of breath (Asthma).   amLODipine 10 MG tablet Commonly known as: NORVASC Take 10 mg by mouth daily.   chlorthalidone 25 MG tablet Commonly known as: HYGROTON Take 12.5 mg by mouth 2 (two) times daily.   docusate sodium 100 MG capsule Commonly known as: COLACE Take 1 capsule (100 mg total) by mouth 2 (two) times daily.   escitalopram 20 MG tablet Commonly known as: LEXAPRO Take 20 mg by mouth daily.   HYDROcodone-acetaminophen 5-325 MG tablet Commonly known as: Norco Take 1-2 tablets by mouth every 6 (six) hours as needed for moderate pain or severe pain.   Intrarosa 6.5 MG Inst Generic drug: Prasterone Place 6.5 mg vaginally daily as needed (hormone).   losartan 100 MG tablet Commonly known as: COZAAR Take 100 mg by mouth daily.   pantoprazole 40 MG tablet Commonly known as: PROTONIX Take 1 tablet (40 mg total) by mouth 2 (two) times daily before a meal. Pantoprazole 40 mg twice daily ( before first and last meal of the day ) for 4 weeks, then once daily for 8 weeks. What changed:  when to take this additional instructions   potassium chloride 10 MEQ tablet Commonly known as: KLOR-CON M Take 10 mEq by mouth daily.   pravastatin 10 MG tablet Commonly known as: PRAVACHOL Take 10 mg by mouth daily.   Vitamin D3 25 MCG (1000 UT) Caps Take 1,000 Units by mouth daily.  Followup:   Follow-up Information     Janith Lima, MD Follow up on 06/22/2022.   Specialty: Urology Why: at 9:15 Contact information: [place] [address] [address] [facility]  Pager: [phone]

## 2022-06-16 NOTE — TOC Initial Note (Signed)
Transition of Care [facility]) - Initial/Assessment Note    Patient Details  Name: Tracy Perez MRN: [id number] Date of Birth: [birth year]  Transition of [facility]) CM/SW Contact:    Leeroy Cha, RN Phone Number: 06/16/2022, 7:58 AM  Clinical Narrative:                  Transition of Care [facility]) Screening Note   Patient Details  Name: Tracy Perez Date of Birth: 1966-06-15   Transition of Care [facility]) CM/SW Contact:    Leeroy Cha, RN Phone Number: 06/16/2022, 7:58 AM    Transition of Care [facility]) has reviewed patient and no TOC needs have been identified at this time. We will continue to monitor patient advancement through interdisciplinary progression rounds. If new patient transition needs arise, please place a TOC consult.    Expected Discharge Plan: Home/Self Care Barriers to Discharge: Continued Medical Work up   Patient Goals and CMS Choice Patient states their goals for this hospitalization and ongoing recovery are:: to go home CMS Medicare.gov Compare Post Acute Care list provided to:: Patient Choice offered to / list presented to : Patient  Expected Discharge Plan and Services Expected Discharge Plan: Home/Self Care   Discharge Planning Services: CM Consult   Living arrangements for the past 2 months: Apartment                                      Prior Living Arrangements/Services Living arrangements for the past 2 months: Apartment Lives with:: Self Patient language and need for interpreter reviewed:: Yes Do you feel safe going back to the place where you live?: Yes            Criminal Activity/Legal Involvement Pertinent to Current Situation/Hospitalization: No - Comment as needed  Activities of Daily Living      Permission Sought/Granted                  Emotional Assessment Appearance:: Appears stated age     Orientation: : Oriented to Self, Oriented to Place, Oriented to  Time, Oriented to  Situation Alcohol / Substance Use: Not Applicable Psych Involvement: No (comment)  Admission diagnosis:  Renal mass [N28.89] Patient Active Problem List   Diagnosis Date Noted   Renal mass 06/15/2022   Left kidney mass 02/10/2022   Hematoma 02/06/2022   Perinephric hematoma 02/06/2022   Leukocytosis 02/06/2022   Screening for colon cancer 02/21/2016   Mucus in stool 07/25/2015   Cough variant asthma 05/23/2014   Upper airway cough syndrome 04/23/2014   Depressive disorder 02/20/2014   Generalized anxiety disorder 02/20/2014   Essential hypertension 02/20/2014   Hyperlipidemia 02/20/2014   Personal history of tobacco use, presenting hazards to health 02/20/2014   Vitamin D deficiency 05/25/2013   Agoraphobia with panic disorder 04/29/2013   Attention deficit hyperactivity disorder (ADHD) 04/29/2013   Esophageal reflux 04/29/2013   Cancer of central portion of left female breast ([place]) 02/09/2013   Hypertrophy of uterus 01/07/2012   Anemia 09/03/2011   PCP:  Berkley Harvey, NP Pharmacy:   [id number] - [place], [place] - [address] [place] [address] Phone: [phone] Fax: [phone]     Social Determinants of Health (SDOH) Interventions    Readmission Risk Interventions     No data to display

## 2022-06-19 LAB — SURGICAL PATHOLOGY

## 2022-07-04 ENCOUNTER — Emergency Department (HOSPITAL_BASED_OUTPATIENT_CLINIC_OR_DEPARTMENT_OTHER)
Admission: EM | Admit: 2022-07-04 | Discharge: 2022-07-04 | Disposition: A | Discharge status: Home / Self Care | Payer: BC Managed Care – PPO | Attending: Emergency Medicine | Admitting: Emergency Medicine

## 2022-07-04 ENCOUNTER — Encounter (HOSPITAL_BASED_OUTPATIENT_CLINIC_OR_DEPARTMENT_OTHER): Payer: Self-pay | Admitting: Emergency Medicine

## 2022-07-04 ENCOUNTER — Other Ambulatory Visit: Payer: Self-pay

## 2022-07-04 DIAGNOSIS — Z853 Personal history of malignant neoplasm of breast: Secondary | ICD-10-CM | POA: Diagnosis not present

## 2022-07-04 DIAGNOSIS — L299 Pruritus, unspecified: Secondary | ICD-10-CM | POA: Insufficient documentation

## 2022-07-04 DIAGNOSIS — M79641 Pain in right hand: Secondary | ICD-10-CM | POA: Insufficient documentation

## 2022-07-04 DIAGNOSIS — I10 Essential (primary) hypertension: Secondary | ICD-10-CM | POA: Diagnosis not present

## 2022-07-04 DIAGNOSIS — Z79899 Other long term (current) drug therapy: Secondary | ICD-10-CM | POA: Diagnosis not present

## 2022-07-04 DIAGNOSIS — M79642 Pain in left hand: Secondary | ICD-10-CM | POA: Diagnosis not present

## 2022-07-04 LAB — COMPREHENSIVE METABOLIC PANEL
ALT: 61 U/L — ABNORMAL HIGH (ref 0–44)
AST: 44 U/L — ABNORMAL HIGH (ref 15–41)
Albumin: 3.8 g/dL (ref 3.5–5.0)
Alkaline Phosphatase: 198 U/L — ABNORMAL HIGH (ref 38–126)
Anion gap: 10 (ref 5–15)
BUN: 20 mg/dL (ref 6–20)
CO2: 26 mmol/L (ref 22–32)
Calcium: 9.2 mg/dL (ref 8.9–10.3)
Chloride: 102 mmol/L (ref 98–111)
Creatinine, Ser: 1.32 mg/dL — ABNORMAL HIGH (ref 0.44–1.00)
GFR, Estimated: 47 mL/min — ABNORMAL LOW (ref >60)
Glucose, Bld: 96 mg/dL (ref 70–99)
Potassium: 3.6 mmol/L (ref 3.5–5.1)
Sodium: 138 mmol/L (ref 135–145)
Total Bilirubin: 0.5 mg/dL (ref 0.3–1.2)
Total Protein: 8 g/dL (ref 6.5–8.1)

## 2022-07-04 LAB — CBC WITH DIFFERENTIAL/PLATELET
Abs Immature Granulocytes: 0.01 10*3/uL (ref 0.00–0.07)
Basophils Absolute: 0 10*3/uL (ref 0.0–0.1)
Basophils Relative: 0 %
Eosinophils Absolute: 0.2 10*3/uL (ref 0.0–0.5)
Eosinophils Relative: 2 %
HCT: 37.4 % (ref 36.0–46.0)
Hemoglobin: 12.2 g/dL (ref 12.0–15.0)
Immature Granulocytes: 0 %
Lymphocytes Relative: 26 %
Lymphs Abs: 2.2 10*3/uL (ref 0.7–4.0)
MCH: 28.5 pg (ref 26.0–34.0)
MCHC: 32.6 g/dL (ref 30.0–36.0)
MCV: 87.4 fL (ref 80.0–100.0)
Monocytes Absolute: 0.9 10*3/uL (ref 0.1–1.0)
Monocytes Relative: 10 %
Neutro Abs: 5.3 10*3/uL (ref 1.7–7.7)
Neutrophils Relative %: 62 %
Platelets: 444 10*3/uL — ABNORMAL HIGH (ref 150–400)
RBC: 4.28 MIL/uL (ref 3.87–5.11)
RDW: 12.7 % (ref 11.5–15.5)
WBC: 8.6 10*3/uL (ref 4.0–10.5)
nRBC: 0 % (ref 0.0–0.2)

## 2022-07-04 MED ORDER — HYDROXYZINE HCL 25 MG PO TABS
25.0000 mg | ORAL_TABLET | Freq: Once | ORAL | Status: AC
  Administered 2022-07-04: 25 mg via ORAL
  Filled 2022-07-04: qty 1

## 2022-07-04 MED ORDER — HYDROXYZINE HCL 25 MG PO TABS: 25.0000 mg | ORAL_TABLET | Freq: Four times a day (QID) | ORAL | 0 refills | Status: AC | PRN

## 2022-07-04 NOTE — ED Provider Notes (Signed)
[facility] EMERGENCY DEPARTMENT Provider Note   CSN: [id number] Arrival date & time: 07/04/22  3559     History  Chief Complaint  Patient presents with   hands burning     Tracy Perez is a 56 y.o. female.  The history is provided by the patient and medical records.   Tracy Perez is a 56 y.o. female who presents to the Emergency Department complaining of hand pain.  She presents emergency department for evaluation of burning and itching in bilateral hands that started on Thursday.  The symptoms are migratory throughout the hands and wax and wane.  No associated fever, chest pain, abdominal pain, numbness, weakness, neck pain, back pain.  She did have a nephrectomy on July 24 for renal carcinoma.  She does have a remote history of stage I breast cancer that is in remission status post radiation.  She has a history of hypertension, hyperlipidemia and prediabetes.  No new medications since her procedure.  She has not had this previously.  She has tried Benadryl cream without significant improvement in symptoms.    Home Medications Prior to Admission medications   Medication Sig Start Date End Date Taking? Authorizing Provider  hydrOXYzine (ATARAX) 25 MG tablet Take 1 tablet (25 mg total) by mouth every 6 (six) hours as needed for itching. 07/04/22  Yes Quintella Reichert, MD  albuterol (VENTOLIN HFA) 108 (90 Base) MCG/ACT inhaler Inhale 2 puffs into the lungs every 6 (six) hours as needed for shortness of breath (Asthma).    [provider]  amLODipine (NORVASC) 10 MG tablet Take 10 mg by mouth daily. 05/28/20   [provider]  chlorthalidone (HYGROTON) 25 MG tablet Take 12.5 mg by mouth 2 (two) times daily.    [provider]  Cholecalciferol (VITAMIN D3) 25 MCG (1000 UT) CAPS Take 1,000 Units by mouth daily.    [provider]  docusate sodium (COLACE) 100 MG capsule Take 1 capsule (100 mg total) by mouth 2 (two) times daily.  06/15/22   Debbrah Alar, PA-C  escitalopram (LEXAPRO) 20 MG tablet Take 20 mg by mouth daily. 04/07/21   [provider]  HYDROcodone-acetaminophen (NORCO) 5-325 MG tablet Take 1-2 tablets by mouth every 6 (six) hours as needed for moderate pain or severe pain. 06/15/22   Debbrah Alar, PA-C  losartan (COZAAR) 100 MG tablet Take 100 mg by mouth daily. 09/24/21   [provider]  pantoprazole (PROTONIX) 40 MG tablet Take 1 tablet (40 mg total) by mouth 2 (two) times daily before a meal. Pantoprazole 40 mg twice daily ( before first and last meal of the day ) for 4 weeks, then once daily for 8 weeks. Patient taking differently: Take 40 mg by mouth daily. 07/21/21   Pyrtle, Lajuan Lines, MD  potassium chloride (KLOR-CON M) 10 MEQ tablet Take 10 mEq by mouth daily. 05/15/22   [provider]  Prasterone (INTRAROSA) 6.5 MG INST Place 6.5 mg vaginally daily as needed (hormone).    [provider]  pravastatin (PRAVACHOL) 10 MG tablet Take 10 mg by mouth daily. 06/10/21   [provider]      Allergies    Ibuprofen, Iodinated contrast media, Prednisolone, Gadobutrol, Gadolinium derivatives, and Gadoversetamide    Review of Systems   Review of Systems  All other systems reviewed and are negative.   Physical Exam Updated Vital Signs BP 122/79   Pulse 94   Temp 99.1 F (37.3 C) (Oral)   Resp 18  Ht '5\' 7"'$  (1.702 m)   Wt 96.2 kg   LMP 02/10/2013   SpO2 97%   BMI 33.20 kg/m  Physical Exam Vitals and nursing note reviewed.  Constitutional:      Appearance: She is well-developed.  HENT:     Head: Normocephalic and atraumatic.  Cardiovascular:     Rate and Rhythm: Normal rate and regular rhythm.     Heart sounds: No murmur heard. Pulmonary:     Effort: Pulmonary effort is normal. No respiratory distress.     Breath sounds: Normal breath sounds.  Abdominal:     Palpations: Abdomen is soft.     Tenderness: There is no abdominal tenderness. There is no  guarding or rebound.  Musculoskeletal:        General: No swelling or tenderness.     Comments: 2+ radial pulses bilaterally.  Range of motion is intact throughout the digits of bilateral hands.  There is no soft tissue changes or rashes to the hands bilaterally  Skin:    General: Skin is warm and dry.  Neurological:     Mental Status: She is alert and oriented to person, place, and time.     Comments: 5 out of 5 grip strength in bilateral upper extremities with sensation to light touch intact in bilateral upper extremities  Psychiatric:        Behavior: Behavior normal.     ED Results / Procedures / Treatments   Labs (all labs ordered are listed, but only abnormal results are displayed) Labs Reviewed  COMPREHENSIVE METABOLIC PANEL - Abnormal; Notable for the following components:      Result Value   Creatinine, Ser 1.32 (*)    AST 44 (*)    ALT 61 (*)    Alkaline Phosphatase 198 (*)    GFR, Estimated 47 (*)    All other components within normal limits  CBC WITH DIFFERENTIAL/PLATELET - Abnormal; Notable for the following components:   Platelets 444 (*)    All other components within normal limits    EKG None  Radiology No results found.  Procedures Procedures    Medications Ordered in ED Medications  hydrOXYzine (ATARAX) tablet 25 mg (25 mg Oral Given 07/04/22 0408)    ED Course/ Medical Decision Making/ A&P                           Medical Decision Making Amount and/or Complexity of Data Reviewed Labs: ordered.  Risk Prescription drug management.   Patient here for evaluation of burning and itching to bilateral hands, no reports of trauma.  No head neck pain.  No additional symptoms.  She is recently status post nephrectomy for renal mass.  Labs today are significant for very mild elevation in creatinine when compared to priors, very mild elevation in transaminases.  No abdominal pain or systemic symptoms.  She is neurologically intact on evaluation, no  evidence or concern for acute cord injury.  Will attempt a trial of hydroxyzine for her itching/burning.  No evidence of skin pathology on exam.  Discussed importance of PCP follow-up regarding lab recheck.  Return precautions discussed.        Final Clinical Impression(s) / ED Diagnoses Final diagnoses:  Itching    Rx / DC Orders ED Discharge Orders          Ordered    hydrOXYzine (ATARAX) 25 MG tablet  Every 6 hours PRN        07/04/22  2003              Quintella Reichert, MD 07/04/22 [phone]

## 2022-07-04 NOTE — ED Triage Notes (Signed)
Pt states both of her hands have been burning since Thursday   Pt states she had her left kidney removed on the 24th of July so she just wants to be sure this has nothing to do with that  Pt states she has been using benadryl cream without relief

## 2022-07-04 NOTE — Discharge Instructions (Addendum)
The cause of your symptoms was not identified today.  You may take the hydroxyzine as needed according to instructions for itching.  Get rechecked if you develop fevers, neck pain, yellowing of your eyes or new concerning symptoms.  Please call your doctor to follow-up closely.  Your kidney function was slightly elevated with a creatinine of 1.32 today. Your liver function was also just barely elevated.  Stop taking your chlorthalidone for now and call your doctor for recheck of your labs.

## 2022-07-08 ENCOUNTER — Other Ambulatory Visit: Payer: Self-pay | Admitting: Urology

## 2022-07-08 DIAGNOSIS — D49512 Neoplasm of unspecified behavior of left kidney: Secondary | ICD-10-CM

## 2022-07-10 IMAGING — CR DG CHEST 2V
2 series · 2 of 2 positions shown · non-contrast
Comparison: Chest radiograph 05/23/2014

CLINICAL DATA: History of left renal neoplasm.

EXAM:
CHEST - 2 VIEW

[w chest pa]
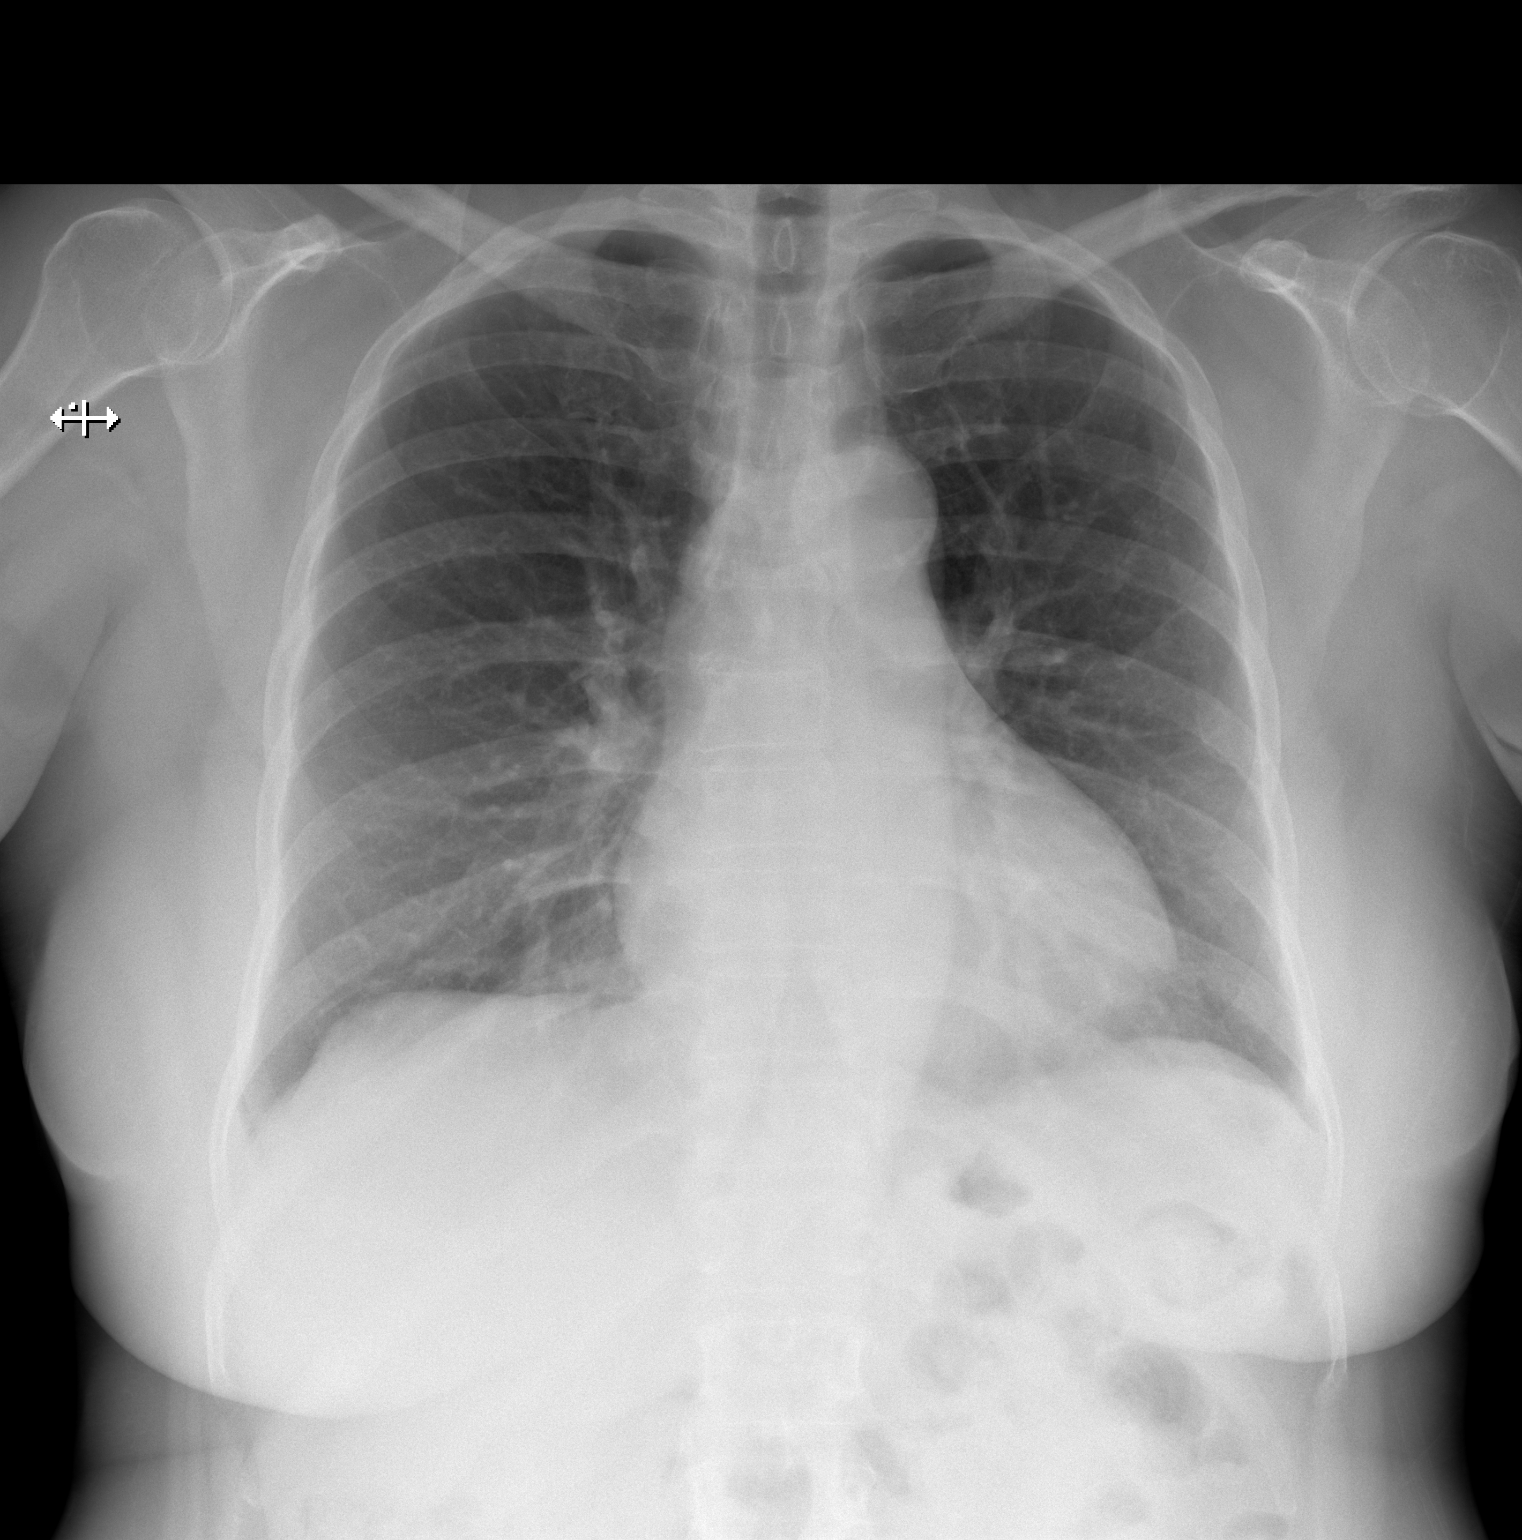

[w chest lat]
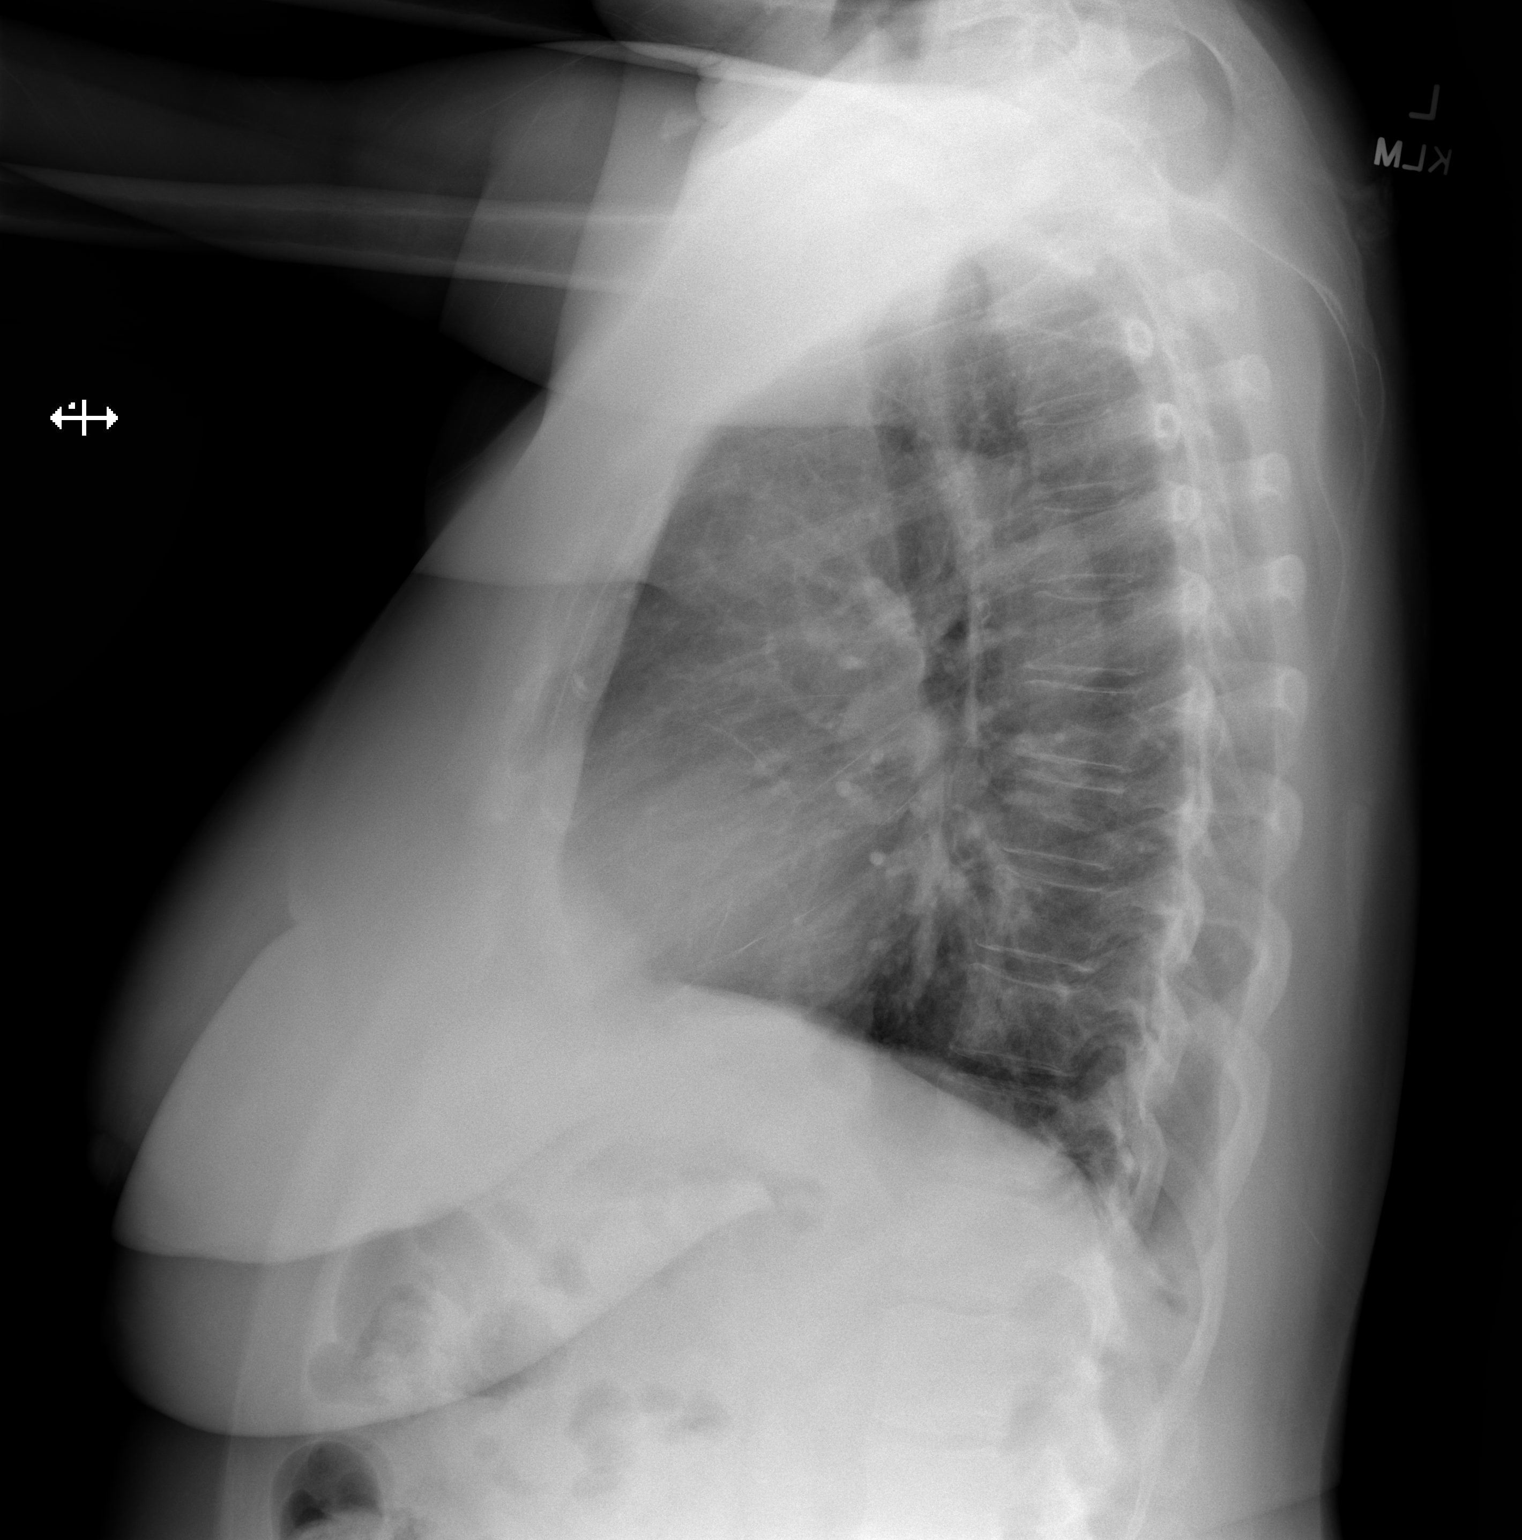

[2 of 2 positions shown; findings below may reference images not displayed]

FINDINGS: The heart size and mediastinal contours are within normal limits.
Both lungs are clear. The visualized skeletal structures are
unremarkable.
IMPRESSION: No active cardiopulmonary disease.

## 2022-07-10 IMAGING — MR MR ABDOMEN WO/W CM
13 of 20 series · 27 of 48 positions shown · IV contrast (10 GADAVIST)
Comparison: CT February 09, 2022 and February 05, 2022

CLINICAL DATA: Evaluation of renal neoplasm.

EXAM:
MRI ABDOMEN WITHOUT AND WITH CONTRAST
TECHNIQUE: Multiplanar multisequence MR imaging of the abdomen was performed
both before and after the administration of intravenous contrast.
CONTRAST:  10mL GADAVIST GADOBUTROL 1 MMOL/ML IV SOLN

[Series 3: T2 · coronal · 5.0mm · 0.74mm/px · 1 of 40 slices shown (1 of 3)]
[im 1/40]
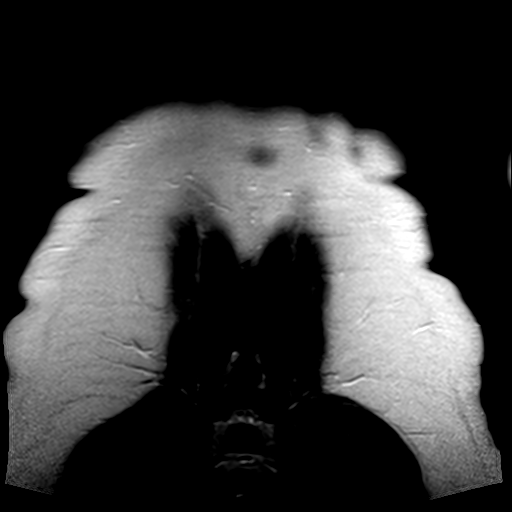

[Series 4: T2 · axial · 5.0mm · 0.70mm/px · 1 of 40 slices shown (2 of 3)]
[im 1/40]
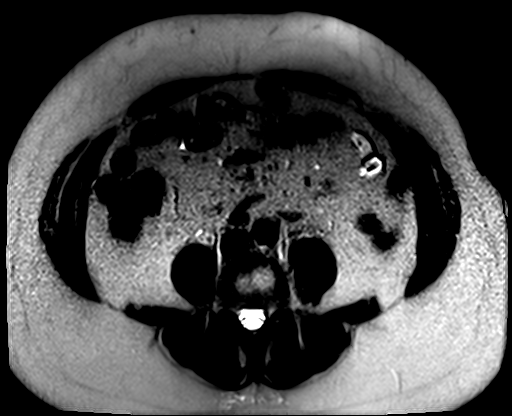

[Series 5: T1 dynamic · coronal · 2.5mm · 0.74mm/px · 1 of 72 slices shown (1 of 2)]
[im 1/72]
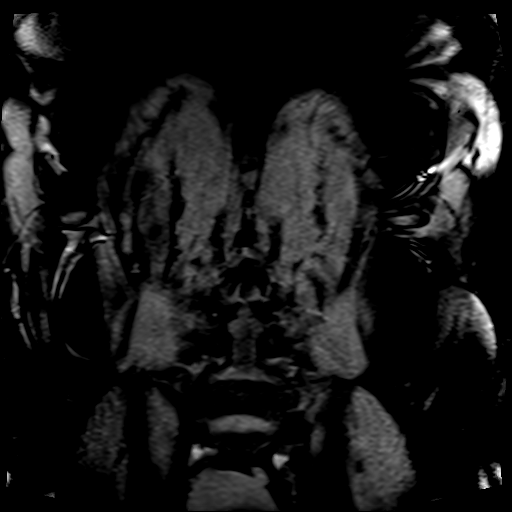

[Series 6: bSSFP · axial · 4.0mm · 0.68mm/px · z∈[-140,+73]mm · 2 of 55 slices shown]
[im 1/55]
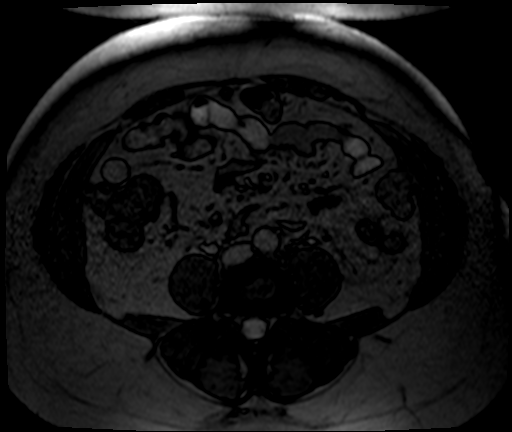
[im 55/55]
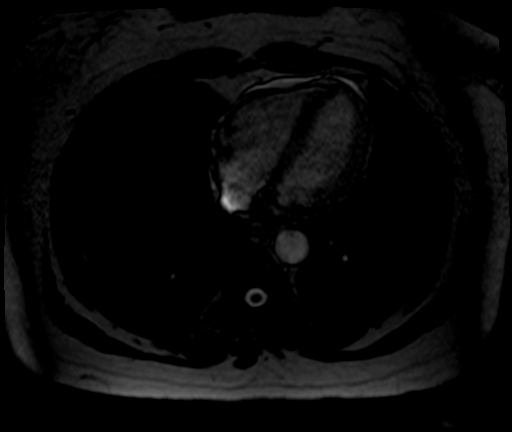

[Series 8: T1 · axial · 5.0mm · 0.70mm/px · z∈[-144,+75]mm · 2 of 76 slices shown]
[im 1/76]
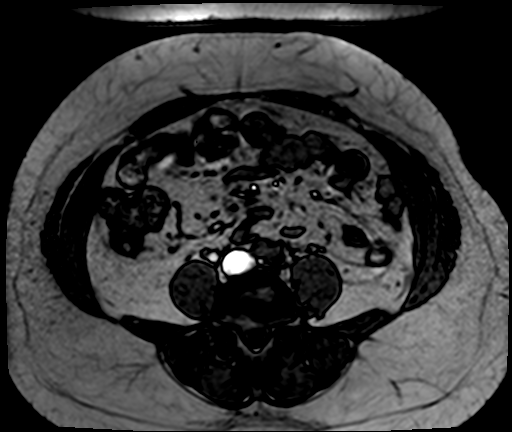
[im 76/76]
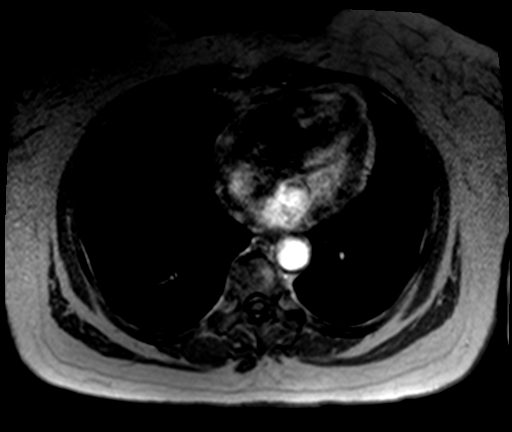

[Series 9: T2 · axial · 5.0mm · 1.41mm/px · 1 of 42 slices shown (3 of 3)]
[im 1/42]
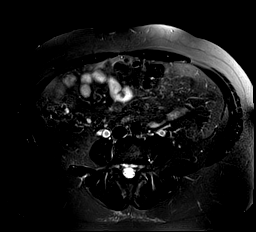

[Series 10: [id number]_trig · axial · 5.0mm · 1.82mm/px · z∈[-108,+117]mm · 4 of 120 slices shown]
[im 1/120]
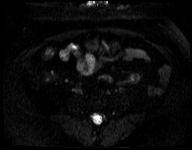
[im 40/120]
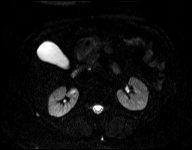
[im 80/120]
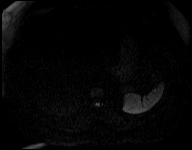
[im 120/120]
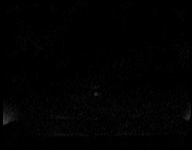

[Series 11: [id number]_trig_adc · axial · 5.0mm · 1.82mm/px · 1 of 40 slices shown]
[im 1/40]
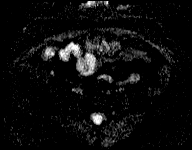

[Series 12: T1 dynamic · axial · non-contrast · 2.4mm · 0.74mm/px · z∈[-133,+73]mm · 3 of 88 slices shown (2 of 2)]
[im 1/88]
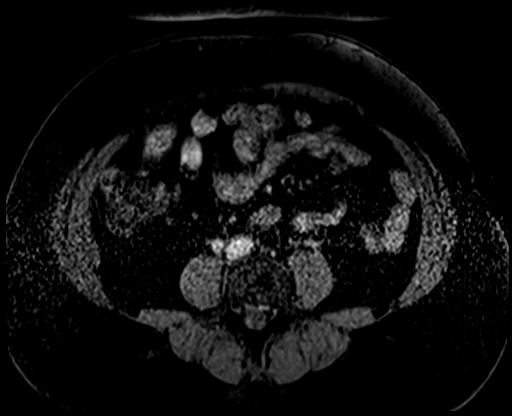
[im 44/88]
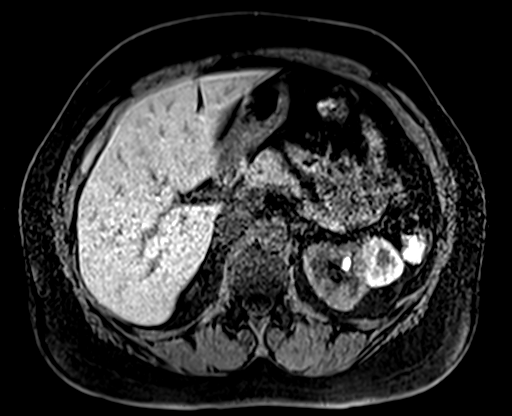
[im 88/88]
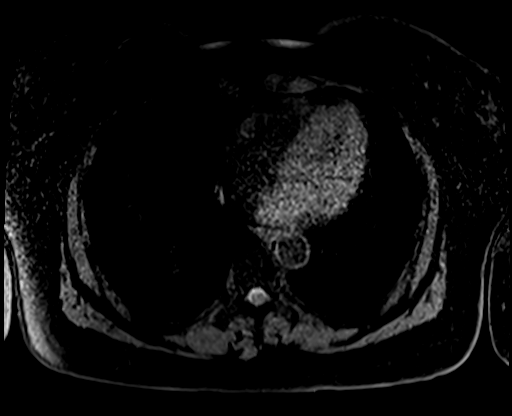

[Series 13: post 30 sec · axial · 2.4mm · 0.74mm/px · z∈[-133,+73]mm · 3 of 88 slices shown]
[im 1/88]
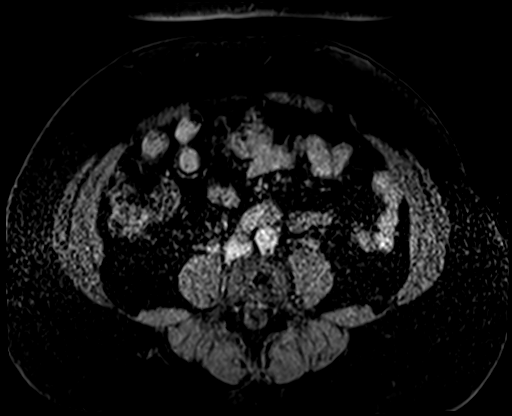
[im 44/88]
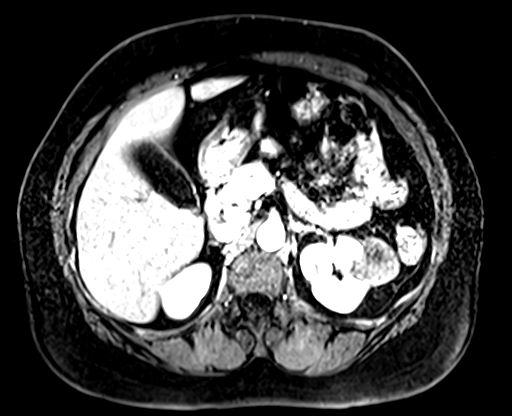
[im 88/88]
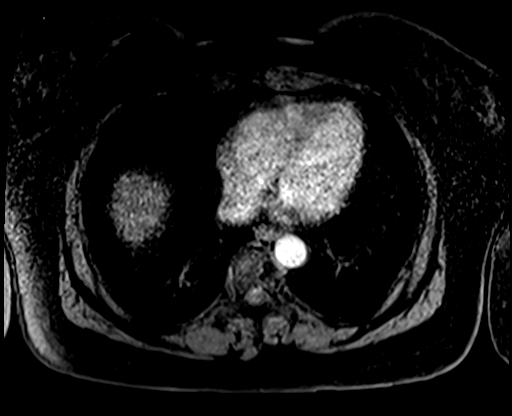

[Series 14: post 30 sec_sub · axial · 2.4mm · 0.74mm/px · z∈[-133,+73]mm · 3 of 88 slices shown]
[im 1/88]
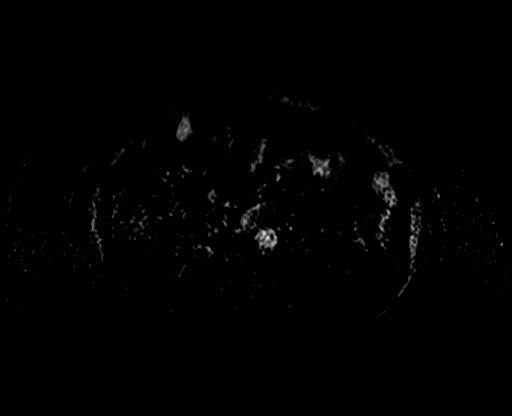
[im 44/88]
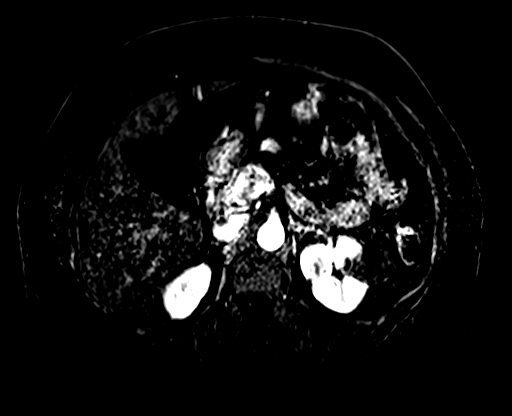
[im 88/88]
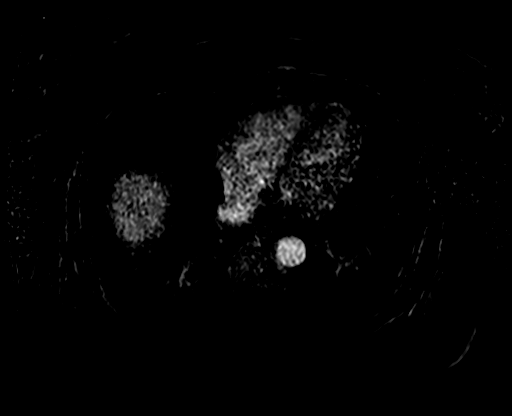

[Series 15: post 60 sec · axial · 2.4mm · 0.74mm/px · z∈[-133,+73]mm · 3 of 88 slices shown]
[im 1/88]
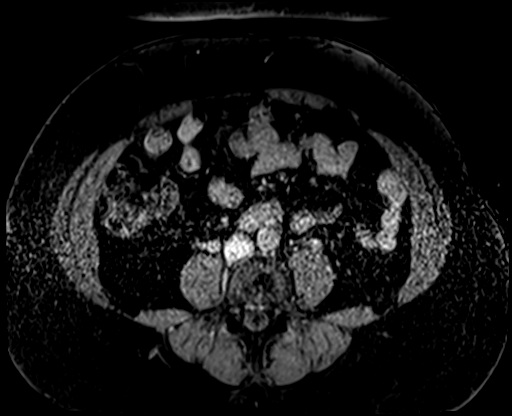
[im 44/88]
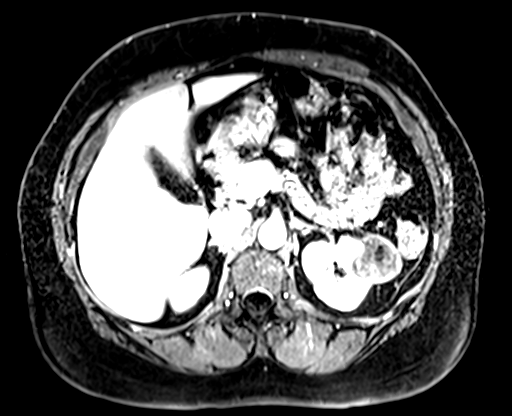
[im 88/88]
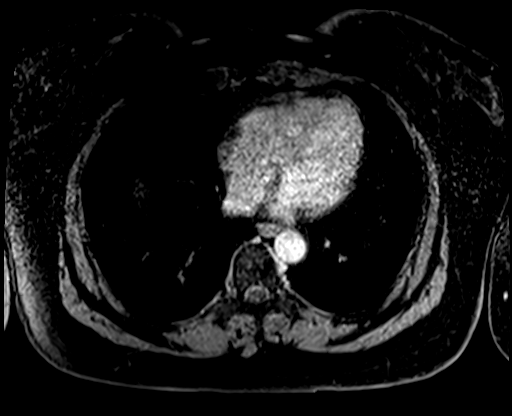

[Series 16: post 60 sec_sub · axial · 2.4mm · 0.74mm/px · z∈[-133,-32]mm · 2 of 88 slices shown]
[im 1/88]
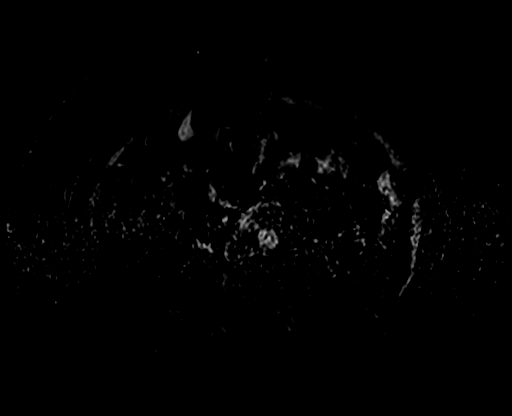
[im 44/88]
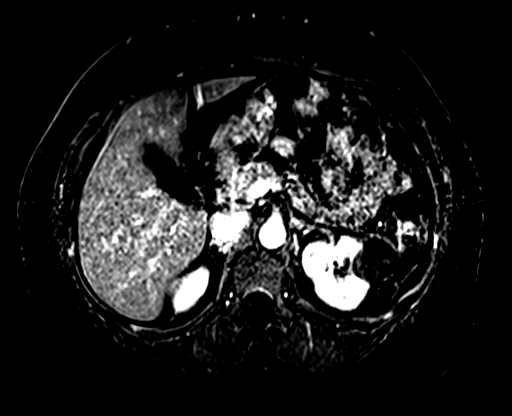

[27 of 48 positions shown; findings below may reference images not displayed]

FINDINGS: Lower chest: No acute abnormality.

Hepatobiliary: No significant hepatic steatosis. Tiny bilobar
hepatic cysts. No solid enhancing hepatic lesion. Gallbladder is
distended without wall thickening or pericholecystic fluid.

Pancreas: No pancreatic ductal dilation or evidence of acute
inflammation. No cystic or solid hyperenhancing pancreatic lesion.

Spleen:  No splenomegaly or focal splenic lesion.

Adrenals/Urinary Tract: Bilateral adrenal glands appear normal.
Exophytic complex left renal lesion measures 3.7 x 3.6 cm on image
44/17, and demonstrates postcontrast enhancement is most consistent
with a renal cell carcinoma.

Near complete resolution of the previously identified left
perinephric hematoma.

Nonenhancing T2 hyperintense subcentimeter renal lesions measuring
up to 9 mm are most consistent with benign renal cysts requiring no
independent follow-up.

Stomach/Bowel: Visualized portions within the abdomen are
unremarkable.

Vascular/Lymphatic: Normal caliber abdominal aorta. Patent renal
veins. Smooth IVC contours. No pathologically enlarged abdominal
lymph nodes.

Other:  No significant abdominal free fluid.

Musculoskeletal: Vertebral body osseous hemangiomas at L3 and L4. No
suspicious osseous lesion identified.
IMPRESSION: 1. Exophytic complex left renal lesion measures 3.7 x 3.6 cm, most
consistent with a renal cell carcinoma.
2. Near complete resolution of the previously identified left
perinephric hematoma.
3. No evidence of tumor in renal vein or abdominal metastatic
disease.

## 2022-07-22 ENCOUNTER — Other Ambulatory Visit: Payer: Self-pay | Admitting: Internal Medicine

## 2022-07-23 ENCOUNTER — Other Ambulatory Visit: Payer: Self-pay | Admitting: Internal Medicine

## 2022-07-23 ENCOUNTER — Telehealth: Payer: Self-pay | Admitting: Internal Medicine

## 2022-07-23 MED ORDER — PANTOPRAZOLE SODIUM 40 MG PO TBEC: 40.0000 mg | DELAYED_RELEASE_TABLET | Freq: Every day | ORAL | 1 refills | Status: DC

## 2022-07-23 NOTE — Telephone Encounter (Signed)
Informed patient that I am unsure why her prescription for pantoprazole was denied but I will send a refill to her local pharmacy. Patient verbalized understanding and prescription sent.

## 2022-07-23 NOTE — Telephone Encounter (Signed)
Patient called, states her protonix '40mg'$  was denied. Requesting a call back on if she needs to keep taking the medication or not. Please call to advise.

## 2022-08-27 ENCOUNTER — Ambulatory Visit
Admission: RE | Admit: 2022-08-27 | Discharge: 2022-08-27 | Disposition: A | Discharge status: Home / Self Care | Payer: BC Managed Care – PPO | Source: Ambulatory Visit | Attending: Urology | Admitting: Urology

## 2022-08-27 DIAGNOSIS — D49512 Neoplasm of unspecified behavior of left kidney: Secondary | ICD-10-CM

## 2022-08-27 MED ORDER — GADOPICLENOL 0.5 MMOL/ML IV SOLN
10.0000 mL | Freq: Once | INTRAVENOUS | Status: AC | PRN
  Administered 2022-08-27: 10 mL via INTRAVENOUS

## 2022-10-03 ENCOUNTER — Other Ambulatory Visit: Payer: Self-pay | Admitting: Internal Medicine

## 2022-10-05 ENCOUNTER — Other Ambulatory Visit: Payer: Self-pay | Admitting: Internal Medicine

## 2022-10-21 ENCOUNTER — Other Ambulatory Visit: Payer: Self-pay | Admitting: Internal Medicine

## 2022-12-01 ENCOUNTER — Other Ambulatory Visit: Payer: Self-pay | Admitting: Urology

## 2022-12-01 DIAGNOSIS — D49512 Neoplasm of unspecified behavior of left kidney: Secondary | ICD-10-CM

## 2022-12-01 DIAGNOSIS — E279 Disorder of adrenal gland, unspecified: Secondary | ICD-10-CM

## 2023-01-01 ENCOUNTER — Ambulatory Visit
Admission: RE | Admit: 2023-01-01 | Discharge: 2023-01-01 | Disposition: A | Discharge status: Home / Self Care | Payer: BC Managed Care – PPO | Source: Ambulatory Visit | Attending: Urology | Admitting: Urology

## 2023-01-01 DIAGNOSIS — D49512 Neoplasm of unspecified behavior of left kidney: Secondary | ICD-10-CM

## 2023-01-01 DIAGNOSIS — E279 Disorder of adrenal gland, unspecified: Secondary | ICD-10-CM

## 2023-01-01 MED ORDER — GADOPICLENOL 0.5 MMOL/ML IV SOLN
10.0000 mL | Freq: Once | INTRAVENOUS | Status: AC | PRN
  Administered 2023-01-01: 10 mL via INTRAVENOUS

## 2023-02-19 ENCOUNTER — Other Ambulatory Visit: Payer: Self-pay | Admitting: Nurse Practitioner

## 2023-02-19 DIAGNOSIS — Z1231 Encounter for screening mammogram for malignant neoplasm of breast: Secondary | ICD-10-CM

## 2023-04-12 ENCOUNTER — Ambulatory Visit
Admission: RE | Admit: 2023-04-12 | Discharge: 2023-04-12 | Disposition: A | Discharge status: Home / Self Care | Payer: BC Managed Care – PPO | Source: Ambulatory Visit | Attending: Nurse Practitioner | Admitting: Nurse Practitioner

## 2023-04-12 DIAGNOSIS — Z1231 Encounter for screening mammogram for malignant neoplasm of breast: Secondary | ICD-10-CM

## 2023-05-05 ENCOUNTER — Other Ambulatory Visit: Payer: Self-pay | Admitting: Urology

## 2023-05-05 DIAGNOSIS — D49512 Neoplasm of unspecified behavior of left kidney: Secondary | ICD-10-CM

## 2023-05-17 ENCOUNTER — Other Ambulatory Visit: Payer: BC Managed Care – PPO

## 2023-06-30 ENCOUNTER — Ambulatory Visit
Admission: RE | Admit: 2023-06-30 | Discharge: 2023-06-30 | Disposition: A | Discharge status: Home / Self Care | Payer: BC Managed Care – PPO | Source: Ambulatory Visit | Attending: Urology | Admitting: Urology

## 2023-06-30 DIAGNOSIS — D49512 Neoplasm of unspecified behavior of left kidney: Secondary | ICD-10-CM

## 2023-06-30 MED ORDER — GADOPICLENOL 0.5 MMOL/ML IV SOLN
10.0000 mL | Freq: Once | INTRAVENOUS | Status: AC | PRN
  Administered 2023-06-30: 10 mL via INTRAVENOUS

## 2023-09-28 ENCOUNTER — Other Ambulatory Visit: Payer: Self-pay | Admitting: Internal Medicine

## 2024-03-13 ENCOUNTER — Other Ambulatory Visit: Payer: Self-pay | Admitting: Nurse Practitioner

## 2024-03-13 DIAGNOSIS — Z1231 Encounter for screening mammogram for malignant neoplasm of breast: Secondary | ICD-10-CM

## 2024-04-24 ENCOUNTER — Ambulatory Visit
Admission: RE | Admit: 2024-04-24 | Discharge: 2024-04-24 | Disposition: A | Discharge status: Home / Self Care | Payer: Self-pay | Source: Ambulatory Visit | Attending: Nurse Practitioner | Admitting: Nurse Practitioner

## 2024-04-24 DIAGNOSIS — Z1231 Encounter for screening mammogram for malignant neoplasm of breast: Secondary | ICD-10-CM

## 2024-05-22 ENCOUNTER — Other Ambulatory Visit: Payer: Self-pay | Admitting: Urology

## 2024-05-22 DIAGNOSIS — D49512 Neoplasm of unspecified behavior of left kidney: Secondary | ICD-10-CM

## 2024-07-10 ENCOUNTER — Other Ambulatory Visit: Payer: Self-pay

## 2024-07-29 ENCOUNTER — Ambulatory Visit
Admission: RE | Admit: 2024-07-29 | Discharge: 2024-07-29 | Disposition: A | Discharge status: Home / Self Care | Payer: Self-pay | Source: Ambulatory Visit | Attending: Urology | Admitting: Urology

## 2024-07-29 DIAGNOSIS — D49512 Neoplasm of unspecified behavior of left kidney: Secondary | ICD-10-CM

## 2024-07-29 MED ORDER — GADOPICLENOL 0.5 MMOL/ML IV SOLN
10.0000 mL | Freq: Once | INTRAVENOUS | Status: AC | PRN
  Administered 2024-07-29: 10 mL via INTRAVENOUS

## 2025-01-01 ENCOUNTER — Ambulatory Visit: Admitting: Physician Assistant
